# Patient Record
Sex: Male | Born: 1985 | Race: White | Hispanic: No | Marital: Single | Smoking: Current every day smoker
Health system: Southern US, Community
[De-identification: ages and names within clinical notes are randomized; demographics above are authoritative.]

## PROBLEM LIST (undated history)

## (undated) DIAGNOSIS — I10 Essential (primary) hypertension: Secondary | ICD-10-CM

## (undated) DIAGNOSIS — L732 Hidradenitis suppurativa: Secondary | ICD-10-CM

## (undated) DIAGNOSIS — F329 Major depressive disorder, single episode, unspecified: Secondary | ICD-10-CM

## (undated) DIAGNOSIS — E785 Hyperlipidemia, unspecified: Secondary | ICD-10-CM

## (undated) DIAGNOSIS — F419 Anxiety disorder, unspecified: Secondary | ICD-10-CM

## (undated) DIAGNOSIS — K219 Gastro-esophageal reflux disease without esophagitis: Secondary | ICD-10-CM

## (undated) DIAGNOSIS — F32A Depression, unspecified: Secondary | ICD-10-CM

## (undated) DIAGNOSIS — R7303 Prediabetes: Secondary | ICD-10-CM

## (undated) HISTORY — DX: Prediabetes: R73.03

## (undated) HISTORY — DX: Hyperlipidemia, unspecified: E78.5

## (undated) HISTORY — DX: Gastro-esophageal reflux disease without esophagitis: K21.9

## (undated) HISTORY — PX: OTHER SURGICAL HISTORY: SHX169

---

## 2011-11-12 ENCOUNTER — Emergency Department (INDEPENDENT_AMBULATORY_CARE_PROVIDER_SITE_OTHER)
Admission: EM | Admit: 2011-11-12 | Discharge: 2011-11-12 | Disposition: A | Discharge status: Home / Self Care | Payer: Self-pay | Source: Home / Self Care

## 2011-11-12 ENCOUNTER — Telehealth (HOSPITAL_COMMUNITY): Payer: Self-pay | Admitting: Physician Assistant

## 2011-11-12 DIAGNOSIS — R51 Headache: Secondary | ICD-10-CM

## 2011-11-12 DIAGNOSIS — F411 Generalized anxiety disorder: Secondary | ICD-10-CM

## 2011-11-12 DIAGNOSIS — F419 Anxiety disorder, unspecified: Secondary | ICD-10-CM

## 2011-11-12 HISTORY — DX: Anxiety disorder, unspecified: F41.9

## 2011-11-12 MED ORDER — ALPRAZOLAM 0.5 MG PO TABS: 0.5000 mg | ORAL_TABLET | Freq: Three times a day (TID) | ORAL | Status: AC | PRN

## 2011-11-12 MED ORDER — IBUPROFEN 800 MG PO TABS: 800.0000 mg | ORAL_TABLET | Freq: Three times a day (TID) | ORAL | Status: AC

## 2011-11-12 NOTE — ED Provider Notes (Signed)
Medical screening examination/treatment/procedure(s) were performed by non-physician practitioner and as supervising physician I was immediately available for consultation/collaboration.  Corrie Mckusick, MD 11/12/11 [phone]

## 2011-11-12 NOTE — ED Provider Notes (Signed)
History     CSN: [id number] Arrival date & time: 11/12/2011 12:09 PM   None     Chief Complaint  Patient presents with  . Anxiety    (Consider location/radiation/quality/duration/timing/severity/associated sxs/prior treatment) HPI Comments: Pt presents with c/o anxiety and headache. He states that he is under a tremendous amount of stress. 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 His grandmother has a lot of medical and behavioral problems. She is verbally abusive towards him. He does not sleep well due to the stress, but also is awakening during the night to care for her. He is considering the option of nursing home placement but would like to care for her in him home as long as possible and is working with her Dr regarding her anger outbursts.  He states he is "nervous all the time." One week ago began having heart pounding and HA which he describes as a pressure and throbbing of his entire head. No visual changes, N/V. Has had intermittent tingling in his face. Has not taken anything for his HA. His BP readings have been elevated during times of stress. Readings have been 158/109, P 127; 148/98, P 97; BP 156/101, P 104.  His mother gave him some of her Xanax for a couple of days and symptoms improved and BP returned to normal range. He denies SI or HI. He has a new pt appt with Dr Clarene Duke tomorrow and is requesting a rx of Xanax to get him through until tomorrow. He admits that he has had anxiety problems in the past and was also prescribed Xanax for short term use for a stressful situation then.   Patient is a 25 y.o. male presenting with anxiety. The history is provided by the patient (mother).  Anxiety This is a recurrent problem. The current episode started more than 1 week ago. The problem occurs constantly. The problem has not changed since onset.Associated symptoms include headaches.  Pertinent negatives include no chest pain, no abdominal pain and no shortness of breath. The symptoms are aggravated by stress. The symptoms are relieved by nothing. Treatments tried: mother's Xanax. The treatment provided significant relief.    History reviewed. No pertinent past medical history.  History reviewed. No pertinent past surgical history.  History reviewed. No pertinent family history.  History  Substance Use Topics  . Smoking status: Current Everyday Smoker  . Smokeless tobacco: Not on file  . Alcohol Use: No      Review of Systems  Constitutional: Negative for fever and chills.  HENT: Negative for ear pain, congestion, sore throat and neck pain.   Eyes: Negative for pain and visual disturbance.  Respiratory: Negative for cough and shortness of breath.   Cardiovascular: Positive for palpitations. Negative for chest pain.  Gastrointestinal: Negative for nausea, vomiting and abdominal pain.  Neurological: Positive for headaches. Negative for dizziness and light-headedness.  Psychiatric/Behavioral: Negative for suicidal ideas. The patient is nervous/anxious.     Allergies  Ceclor and Peanut-containing drug products  Home Medications   Current Outpatient Rx  Name Route Sig Dispense Refill  . ALPRAZOLAM 0.5 MG PO TABS Oral Take 1 tablet (0.5 mg total) by mouth 3 (three) times daily as needed for anxiety. 2 tablet 0  . IBUPROFEN 800 MG PO TABS Oral Take 1 tablet (800 mg total) by mouth 3 (three) times daily. 10 tablet 0  BP 140/80  Pulse 87  Temp(Src) 98.4 F (36.9 C) (Oral)  Resp 20  SpO2 97%  Physical Exam  Nursing note and vitals reviewed. Constitutional: He appears well-developed and well-nourished. No distress.  HENT:  Head: Normocephalic and atraumatic.  Right Ear: Tympanic membrane, external ear and ear canal normal.  Left Ear: Tympanic membrane, external ear and ear canal normal.  Nose: Nose normal.  Mouth/Throat: Uvula is midline, oropharynx  is clear and moist and mucous membranes are normal. No oropharyngeal exudate, posterior oropharyngeal edema or posterior oropharyngeal erythema.  Eyes: Conjunctivae, EOM and lids are normal. Pupils are equal, round, and reactive to light.  Fundoscopic exam:      The right eye shows no AV nicking, no hemorrhage and no papilledema.       The left eye shows no AV nicking, no hemorrhage and no papilledema.  Neck: Neck supple.  Cardiovascular: Normal rate, regular rhythm and normal heart sounds.   Pulmonary/Chest: Effort normal and breath sounds normal. No respiratory distress.  Lymphadenopathy:    He has no cervical adenopathy.  Neurological: He is alert.  Skin: Skin is warm and dry.  Psychiatric: His speech is normal and behavior is normal.       Pt became tearful and crying during visit while discussing his stress.    ED Course  Procedures (including critical care time)  Labs Reviewed - No data to display No results found.   1. Anxiety   2. Headache       MDM  Pt denies SI or HI. Has appt with PCP tomorrow for f/u.         Melody Comas, [place] 11/12/11 1421

## 2011-11-12 NOTE — ED Notes (Signed)
Pt stated he has been having a headache for at least a week now. Pt is orient x3 and  speaking full sentences.  Pt's BP 138/85, spo2 100%, and resp. 20.  Pt is stressed out from work and from taking care of grandmother, whom is diabetic.

## 2011-11-12 NOTE — ED Notes (Signed)
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

## 2011-11-24 NOTE — Telephone Encounter (Signed)
See prescription

## 2013-01-08 ENCOUNTER — Encounter (HOSPITAL_COMMUNITY): Payer: Self-pay | Admitting: Nurse Practitioner

## 2013-01-08 ENCOUNTER — Emergency Department (HOSPITAL_COMMUNITY): Payer: Self-pay

## 2013-01-08 ENCOUNTER — Emergency Department (HOSPITAL_COMMUNITY)
Admission: EM | Admit: 2013-01-08 | Discharge: 2013-01-08 | Disposition: A | Discharge status: Home / Self Care | Payer: Self-pay | Attending: Emergency Medicine | Admitting: Emergency Medicine

## 2013-01-08 DIAGNOSIS — R61 Generalized hyperhidrosis: Secondary | ICD-10-CM | POA: Insufficient documentation

## 2013-01-08 DIAGNOSIS — R002 Palpitations: Secondary | ICD-10-CM | POA: Insufficient documentation

## 2013-01-08 DIAGNOSIS — Z79899 Other long term (current) drug therapy: Secondary | ICD-10-CM | POA: Insufficient documentation

## 2013-01-08 DIAGNOSIS — Z87448 Personal history of other diseases of urinary system: Secondary | ICD-10-CM | POA: Insufficient documentation

## 2013-01-08 DIAGNOSIS — F1210 Cannabis abuse, uncomplicated: Secondary | ICD-10-CM | POA: Insufficient documentation

## 2013-01-08 DIAGNOSIS — F17200 Nicotine dependence, unspecified, uncomplicated: Secondary | ICD-10-CM | POA: Insufficient documentation

## 2013-01-08 HISTORY — DX: Hidradenitis suppurativa: L73.2

## 2013-01-08 LAB — BASIC METABOLIC PANEL
CO2: 22 mEq/L (ref 19–32)
Chloride: 83 mEq/L — ABNORMAL LOW (ref 96–112)
Chloride: 102 mEq/L (ref 96–112)
GFR calc Af Amer: >90 mL/min (ref >90)
GFR calc non Af Amer: >90 mL/min (ref >90)
Potassium: 3.5 mEq/L (ref 3.5–5.1)
Potassium: 3.4 mEq/L — ABNORMAL LOW (ref 3.5–5.1)
Sodium: 162 mEq/L (ref 135–145)

## 2013-01-08 LAB — POCT I-STAT, CHEM 8
HCT: 47 % (ref 39.0–52.0)
Hemoglobin: 16 g/dL (ref 13.0–17.0)
Potassium: 3.2 mEq/L — ABNORMAL LOW (ref 3.5–5.1)
Sodium: 141 mEq/L (ref 135–145)

## 2013-01-08 LAB — D-DIMER, QUANTITATIVE: D-Dimer, Quant: <0.27 ug/mL-FEU (ref 0.00–0.48)

## 2013-01-08 LAB — CBC
MCV: 83.8 fL (ref 78.0–100.0)
Platelets: 172 10*3/uL (ref 150–400)
RBC: 5.68 MIL/uL (ref 4.22–5.81)
WBC: 8.8 10*3/uL (ref 4.0–10.5)

## 2013-01-08 LAB — POCT I-STAT TROPONIN I: Troponin i, poc: 0 ng/mL (ref 0.00–0.08)

## 2013-01-08 MED ORDER — LORAZEPAM 2 MG/ML IJ SOLN
1.0000 mg | Freq: Once | INTRAMUSCULAR | Status: AC
  Administered 2013-01-08: 1 mg via INTRAVENOUS
  Filled 2013-01-08: qty 1

## 2013-01-08 MED ORDER — LORAZEPAM 1 MG PO TABS: 1.0000 mg | ORAL_TABLET | Freq: Three times a day (TID) | ORAL | Status: DC | PRN

## 2013-01-08 MED ORDER — DEXTROSE 5 % IV SOLN
Freq: Once | INTRAVENOUS | Status: AC
  Administered 2013-01-08: 13:00:00 via INTRAVENOUS

## 2013-01-08 MED ORDER — SODIUM CHLORIDE 0.9 % IV BOLUS (SEPSIS): 1000.0000 mL | Freq: Once | INTRAVENOUS | Status: DC

## 2013-01-08 MED ORDER — SODIUM CHLORIDE 0.9 % IV SOLN
Freq: Once | INTRAVENOUS | Status: DC
  Administered 2013-01-08: 12:00:00 via INTRAVENOUS

## 2013-01-08 NOTE — ED Notes (Signed)
Patient transported from X-ray 

## 2013-01-08 NOTE — ED Notes (Signed)
Patient transported to X-ray 

## 2013-01-08 NOTE — ED Notes (Signed)
Family at bedside. 

## 2013-01-08 NOTE — ED Provider Notes (Signed)
History     CSN: [id number]  Arrival date & time 01/08/13  1106   First MD Initiated Contact with Patient 01/08/13 1150      Chief Complaint  Patient presents with  . Panic Attack    (Consider location/radiation/quality/duration/timing/severity/associated sxs/prior treatment) HPI  the patient presents with multiple complaints.  He states that over the past day he has had increasingly significant anxiety, palpitations, generalized sense of discomfort.  This has not improved with clonazepam or Xanax.  There is no associated focal pain, but the patient states that he generally uncomfortable. The patient has had similar prior events over the past months, without clear etiology.  He takes approximately 3 Xanax tablets daily. He is generally well beyond a diagnosis of anxiety, hidradenitis suprativa.  He has a notable family history of several family members with bicuspid aortic valve. The patient smokes cigarettes, marijuana. The patient also drinks alcohol. He states that his symptoms have not improved today has been abusing marijuana in addition to the Xanax and clonazepam.  Past Medical History  Diagnosis Date  . Anxiety   . Hidradenitis suppurativa   . Anxiety     No past surgical history on file.  No family history on file.  History  Substance Use Topics  . Smoking status: Current Every Day Smoker  . Smokeless tobacco: Not on file  . Alcohol Use: Yes      Review of Systems  Constitutional:       Per HPI, otherwise negative  HENT:       Per HPI, otherwise negative  Eyes: Negative.   Respiratory:       Per HPI, otherwise negative  Cardiovascular:       Per HPI, otherwise negative  Gastrointestinal: Negative for vomiting.  Genitourinary: Negative.   Musculoskeletal:       Per HPI, otherwise negative  Skin: Negative.   Neurological: Negative for syncope.    Allergies  Ceclor and Peanut-containing drug products  Home Medications  No current outpatient  prescriptions on file.  BP 139/95  Pulse 112  Temp 98.1 F (36.7 C) (Oral)  Resp 18  SpO2 99%  Physical Exam  Nursing note and vitals reviewed. Constitutional: He is oriented to person, place, and time. He appears well-developed. No distress.  HENT:  Head: Normocephalic and atraumatic.  Eyes: Conjunctivae normal and EOM are normal.  Cardiovascular: Regular rhythm.  Tachycardia present.   Pulmonary/Chest: Effort normal. No stridor. No respiratory distress.  Abdominal: He exhibits no distension.  Musculoskeletal: He exhibits no edema.  Neurological: He is alert and oriented to person, place, and time.  Skin: Skin is warm. He is diaphoretic.  Psychiatric: His mood appears anxious.    ED Course  Procedures (including critical care time)  Labs Reviewed  CBC - Abnormal; Notable for the following:    Hemoglobin 17.6 (*)     MCHC 37.0 (*)     All other components within normal limits  BASIC METABOLIC PANEL - Abnormal; Notable for the following:    Sodium 162 (*)     Chloride 83 (*)     Glucose, Bld 115 (*)     All other components within normal limits  POCT I-STAT TROPONIN I  D-DIMER, QUANTITATIVE   No results found.   No diagnosis found.  Cardiac 121 sinus tach abnormal Pulse ox 99% room air normal  Update: The patient's initial labs demonstrate a sodium of 162, with low calcium, low chloride.  Resuscitation with D5 initiated, confirmatory labs sent.  1:30 PM New labs suggest Na value was lab error  Patient feeling better.  4:16 PM The patient is substantially more calm appearing.  He states that he feels substantially better.  HR - regular - 85 sr, normal   Date: Repeat lab confirms that the study was not significantly elevated  MDM  This young generally well-appearing male presents with ongoing anxiety, pain, palpitations.  Notably, the initial laboratory evaluation for the patient demonstrated significant hypernatremia, though this seems to be lab error.   The patient improved most substantially following provision of Ativan, which is concurrent with a likely diagnosis of anxiety, though with his palpitations, the chronicity of this issue, he requires further evaluation, beyond that available here.  We discussed the possible etiologies of his anxiety, pain, palpitations, including, but not limited to metabolic, endocrinologic, toxicologic causes.  The patient was discharged in stable condition, though with outpatient followup necessary.  Gerhard Munch, MD 01/08/13 2014

## 2013-01-08 NOTE — ED Notes (Signed)
Pt reports taking up to 3mg  xanax for months and has now run out.  Pt also reports feeling anxious for few days.

## 2013-01-08 NOTE — ED Notes (Signed)
Pt reports history of panic attacks, reports since last night he has been having a bad panic attack. Reports he feels his heart is racing, his bp was elevated at home and feels like R arm is numb intermittently also. Pt did have a prescription for xanax that helped but he no longer sees that provider. A&Ox4, breathing easily.

## 2013-01-08 NOTE — ED Notes (Signed)
Lab at bedside

## 2013-01-08 NOTE — ED Notes (Signed)
MD at bedside. 

## 2013-06-07 ENCOUNTER — Emergency Department (HOSPITAL_BASED_OUTPATIENT_CLINIC_OR_DEPARTMENT_OTHER)
Admission: EM | Admit: 2013-06-07 | Discharge: 2013-06-07 | Disposition: A | Discharge status: Home / Self Care | Payer: Self-pay | Attending: Emergency Medicine | Admitting: Emergency Medicine

## 2013-06-07 ENCOUNTER — Encounter (HOSPITAL_BASED_OUTPATIENT_CLINIC_OR_DEPARTMENT_OTHER): Payer: Self-pay | Admitting: Student

## 2013-06-07 DIAGNOSIS — E119 Type 2 diabetes mellitus without complications: Secondary | ICD-10-CM | POA: Insufficient documentation

## 2013-06-07 DIAGNOSIS — F329 Major depressive disorder, single episode, unspecified: Secondary | ICD-10-CM | POA: Insufficient documentation

## 2013-06-07 DIAGNOSIS — F411 Generalized anxiety disorder: Secondary | ICD-10-CM | POA: Insufficient documentation

## 2013-06-07 DIAGNOSIS — F17200 Nicotine dependence, unspecified, uncomplicated: Secondary | ICD-10-CM | POA: Insufficient documentation

## 2013-06-07 DIAGNOSIS — Z79899 Other long term (current) drug therapy: Secondary | ICD-10-CM | POA: Insufficient documentation

## 2013-06-07 DIAGNOSIS — F3289 Other specified depressive episodes: Secondary | ICD-10-CM | POA: Insufficient documentation

## 2013-06-07 DIAGNOSIS — I10 Essential (primary) hypertension: Secondary | ICD-10-CM | POA: Insufficient documentation

## 2013-06-07 DIAGNOSIS — Z872 Personal history of diseases of the skin and subcutaneous tissue: Secondary | ICD-10-CM | POA: Insufficient documentation

## 2013-06-07 DIAGNOSIS — J029 Acute pharyngitis, unspecified: Secondary | ICD-10-CM | POA: Insufficient documentation

## 2013-06-07 HISTORY — DX: Depression, unspecified: F32.A

## 2013-06-07 HISTORY — DX: Essential (primary) hypertension: I10

## 2013-06-07 HISTORY — DX: Major depressive disorder, single episode, unspecified: F32.9

## 2013-06-07 LAB — RAPID STREP SCREEN (MED CTR MEBANE ONLY): Streptococcus, Group A Screen (Direct): NEGATIVE

## 2013-06-07 MED ORDER — AZITHROMYCIN 250 MG PO TABS: 250.0000 mg | ORAL_TABLET | Freq: Every day | ORAL | Status: DC

## 2013-06-07 MED ORDER — AZITHROMYCIN 250 MG PO TABS
500.0000 mg | ORAL_TABLET | Freq: Once | ORAL | Status: AC
  Administered 2013-06-07: 500 mg via ORAL
  Filled 2013-06-07: qty 2

## 2013-06-07 MED ORDER — HYDROCODONE-ACETAMINOPHEN 5-325 MG PO TABS: 1.0000 | ORAL_TABLET | ORAL | Status: DC | PRN

## 2013-06-07 MED ORDER — IBUPROFEN 800 MG PO TABS
800.0000 mg | ORAL_TABLET | Freq: Once | ORAL | Status: AC
  Administered 2013-06-07: 800 mg via ORAL
  Filled 2013-06-07: qty 1

## 2013-06-07 NOTE — ED Notes (Signed)
EDP at bedside  

## 2013-06-07 NOTE — ED Notes (Signed)
Pt in from [facility] with c/o fever of 102.5, generalized aches and chills with associated sore throat.

## 2013-06-07 NOTE — ED Provider Notes (Signed)
History     CSN: [id number]  Arrival date & time 06/07/13  1624   First MD Initiated Contact with Patient 06/07/13 1630      Chief Complaint  Patient presents with  . Fever    (Consider location/radiation/quality/duration/timing/severity/associated sxs/prior treatment) Patient is a 27 y.o. male presenting with fever. The history is provided by the patient. No language interpreter was used.  Fever Max temp prior to arrival:  102.5 Temp source:  Oral Severity:  Moderate Onset quality:  Sudden Duration:  2 days Timing:  Constant Progression:  Worsening Chronicity:  New Relieved by:  Nothing Associated symptoms: sore throat   Associated symptoms: no nausea and no rash     Past Medical History  Diagnosis Date  . Anxiety   . Hidradenitis suppurativa   . Anxiety   . SVT (supraventricular tachycardia)   . Diabetes mellitus without complication   . Depression   . Hypertension     History reviewed. No pertinent past surgical history.  History reviewed. No pertinent family history.  History  Substance Use Topics  . Smoking status: Current Every Day Smoker  . Smokeless tobacco: Not on file  . Alcohol Use: Yes      Review of Systems  Constitutional: Positive for fever.  HENT: Positive for sore throat.   Respiratory: Negative.   Cardiovascular: Negative.   Gastrointestinal: Negative for nausea.  Skin: Negative for rash.    Allergies  Peanut-containing drug products; Ceclor; and Sulfa antibiotics  Home Medications   Current Outpatient Rx  Name  Route  Sig  Dispense  Refill  . buPROPion (WELLBUTRIN SR) 100 MG 12 hr tablet   Oral   Take 100 mg by mouth 2 (two) times daily.         . citalopram (CELEXA) 20 MG tablet   Oral   Take 20 mg by mouth daily.         Marland Kitchen lisinopril (PRINIVIL,ZESTRIL) 20 MG tablet   Oral   Take 20 mg by mouth daily.         . metFORMIN (GLUCOPHAGE) 500 MG tablet   Oral   Take 500 mg by mouth 2 (two) times daily with a  meal.         . propranolol (INDERAL) 80 MG tablet   Oral   Take 80 mg by mouth 2 (two) times daily.         Marland Kitchen LORazepam (ATIVAN) 1 MG tablet   Oral   Take 1 tablet (1 mg total) by mouth 3 (three) times daily as needed for anxiety.   15 tablet   0     BP 155/73  Pulse 126  Temp(Src) 102.5 F (39.2 C) (Oral)  Resp 20  Wt 283 lb (128.368 kg)  SpO2 99%  Physical Exam  Nursing note and vitals reviewed. Constitutional: He is oriented to person, place, and time. He appears well-developed and well-nourished.  HENT:  Right Ear: External ear normal.  Left Ear: External ear normal.  Mouth/Throat: Oropharyngeal exudate and posterior oropharyngeal erythema present.  Cardiovascular: Normal rate and regular rhythm.   Pulmonary/Chest: Effort normal and breath sounds normal.  Musculoskeletal: Normal range of motion.  Neurological: He is alert and oriented to person, place, and time.  Skin: Skin is warm and dry.  Psychiatric: He has a normal mood and affect.    ED Course  Procedures (including critical care time)  Labs Reviewed  RAPID STREP SCREEN  CULTURE, GROUP A STREP  MONONUCLEOSIS SCREEN  No results found.   1. Pharyngitis       MDM  Will treat for strep based on symptoms:pt agree with treatment and pt told to return for worsening symptoms:no sign of peritonsillar abscess at this time       Teressa Lower, NP 06/07/13 1801

## 2013-06-07 NOTE — ED Notes (Signed)
Fluid challenge successful. No difficulty swallowing or maintaining secretions noted.

## 2013-06-07 NOTE — ED Notes (Signed)
Pt OOB to restroom. Ambulates well, gait steady.

## 2013-06-08 NOTE — ED Provider Notes (Signed)
Medical screening examination/treatment/procedure(s) were performed by non-physician practitioner and as supervising physician I was immediately available for consultation/collaboration.   Charles B. Sheldon, MD 06/08/13 0916 

## 2013-06-09 ENCOUNTER — Ambulatory Visit (INDEPENDENT_AMBULATORY_CARE_PROVIDER_SITE_OTHER): Payer: Self-pay | Admitting: Emergency Medicine

## 2013-06-09 ENCOUNTER — Encounter (HOSPITAL_COMMUNITY): Payer: Self-pay | Admitting: Emergency Medicine

## 2013-06-09 ENCOUNTER — Emergency Department (HOSPITAL_COMMUNITY)
Admission: EM | Admit: 2013-06-09 | Discharge: 2013-06-09 | Disposition: A | Discharge status: Home / Self Care | Payer: Self-pay | Attending: Emergency Medicine | Admitting: Emergency Medicine

## 2013-06-09 VITALS — BP 128/83 | HR 87 | Temp 98.3°F | Resp 18 | Ht 76.0 in | Wt 272.8 lb

## 2013-06-09 DIAGNOSIS — R5383 Other fatigue: Secondary | ICD-10-CM | POA: Insufficient documentation

## 2013-06-09 DIAGNOSIS — E860 Dehydration: Secondary | ICD-10-CM

## 2013-06-09 DIAGNOSIS — F411 Generalized anxiety disorder: Secondary | ICD-10-CM | POA: Insufficient documentation

## 2013-06-09 DIAGNOSIS — M2550 Pain in unspecified joint: Secondary | ICD-10-CM | POA: Insufficient documentation

## 2013-06-09 DIAGNOSIS — J039 Acute tonsillitis, unspecified: Secondary | ICD-10-CM

## 2013-06-09 DIAGNOSIS — E119 Type 2 diabetes mellitus without complications: Secondary | ICD-10-CM

## 2013-06-09 DIAGNOSIS — Z792 Long term (current) use of antibiotics: Secondary | ICD-10-CM | POA: Insufficient documentation

## 2013-06-09 DIAGNOSIS — R509 Fever, unspecified: Secondary | ICD-10-CM

## 2013-06-09 DIAGNOSIS — I10 Essential (primary) hypertension: Secondary | ICD-10-CM | POA: Insufficient documentation

## 2013-06-09 DIAGNOSIS — IMO0001 Reserved for inherently not codable concepts without codable children: Secondary | ICD-10-CM | POA: Insufficient documentation

## 2013-06-09 DIAGNOSIS — R5381 Other malaise: Secondary | ICD-10-CM | POA: Insufficient documentation

## 2013-06-09 DIAGNOSIS — R599 Enlarged lymph nodes, unspecified: Secondary | ICD-10-CM | POA: Insufficient documentation

## 2013-06-09 DIAGNOSIS — F17200 Nicotine dependence, unspecified, uncomplicated: Secondary | ICD-10-CM | POA: Insufficient documentation

## 2013-06-09 DIAGNOSIS — I498 Other specified cardiac arrhythmias: Secondary | ICD-10-CM | POA: Insufficient documentation

## 2013-06-09 DIAGNOSIS — R1310 Dysphagia, unspecified: Secondary | ICD-10-CM | POA: Insufficient documentation

## 2013-06-09 DIAGNOSIS — Z79899 Other long term (current) drug therapy: Secondary | ICD-10-CM | POA: Insufficient documentation

## 2013-06-09 DIAGNOSIS — J029 Acute pharyngitis, unspecified: Secondary | ICD-10-CM | POA: Insufficient documentation

## 2013-06-09 DIAGNOSIS — Z872 Personal history of diseases of the skin and subcutaneous tissue: Secondary | ICD-10-CM | POA: Insufficient documentation

## 2013-06-09 DIAGNOSIS — F329 Major depressive disorder, single episode, unspecified: Secondary | ICD-10-CM | POA: Insufficient documentation

## 2013-06-09 DIAGNOSIS — F3289 Other specified depressive episodes: Secondary | ICD-10-CM | POA: Insufficient documentation

## 2013-06-09 LAB — POCT CBC
HCT, POC: 45.3 % (ref 43.5–53.7)
Hemoglobin: 14.7 g/dL (ref 14.1–18.1)
Lymph, poc: 1 (ref 0.6–3.4)
MCH, POC: 31.1 pg (ref 27–31.2)
MCHC: 32.5 g/dL (ref 31.8–35.4)
WBC: 16 10*3/uL — AB (ref 4.6–10.2)

## 2013-06-09 LAB — COMPREHENSIVE METABOLIC PANEL
ALT: 26 U/L (ref 0–53)
AST: 18 U/L (ref 0–37)
Albumin: 4.8 g/dL (ref 3.5–5.2)
Calcium: 9.1 mg/dL (ref 8.4–10.5)
Chloride: 103 mEq/L (ref 96–112)
Creat: 0.71 mg/dL (ref 0.50–1.35)
Potassium: 4.1 mEq/L (ref 3.5–5.3)

## 2013-06-09 LAB — CULTURE, GROUP A STREP

## 2013-06-09 LAB — GLUCOSE, POCT (MANUAL RESULT ENTRY): POC Glucose: 111 mg/dl — AB (ref 70–99)

## 2013-06-09 MED ORDER — AMOXICILLIN-POT CLAVULANATE 875-125 MG PO TABS: 1.0000 | ORAL_TABLET | Freq: Two times a day (BID) | ORAL | Status: DC

## 2013-06-09 MED ORDER — HYDROCODONE-ACETAMINOPHEN 7.5-325 MG/15ML PO SOLN: 15.0000 mL | Freq: Four times a day (QID) | ORAL | Status: DC | PRN

## 2013-06-09 MED ORDER — HYDROCODONE-ACETAMINOPHEN 7.5-325 MG/15ML PO SOLN
10.0000 mL | Freq: Once | ORAL | Status: AC
  Administered 2013-06-09: 10 mL via ORAL
  Filled 2013-06-09: qty 30

## 2013-06-09 MED ORDER — SODIUM CHLORIDE 0.9 % IV SOLN
1.5000 g | Freq: Once | INTRAVENOUS | Status: AC
  Administered 2013-06-09: 1.5 g via INTRAVENOUS
  Filled 2013-06-09 (×2): qty 1.5

## 2013-06-09 NOTE — ED Provider Notes (Signed)
Medical screening examination/treatment/procedure(s) were performed by non-physician practitioner and as supervising physician I was immediately available for consultation/collaboration.   Richardean Canal, MD 06/09/13 [phone]

## 2013-06-09 NOTE — Progress Notes (Signed)
  Subjective:    Patient ID: Spencer Love, male    DOB: Jun 28, 1986, 27 y.o.   MRN: [id number]  HPI 27 year old male presents with sore throat, fever, dizziness x 1 week  104.3 fever yesterday Treated for strep throat x 6/10 with zithromax when went to [facility]  110/64 bp at 2:21pm Right ear pain  History of diabetes  Review of Systems     Objective:   Physical Exam TMs are clear. Nose is normal. Tonsils are 4+ with exudate over both. Is tender right anterior cervical node neck is supple chest is clear abdomen is soft I did not feel a spleen. Results for orders placed in visit on 06/09/13  POCT CBC      Result Value Range   WBC 16.0 (*) 4.6 - 10.2 K/uL   Lymph, poc 1.0  0.6 - 3.4   POC LYMPH PERCENT 6.1 (*) 10 - 50 %L   MID (cbc) 0.6  0 - 0.9   POC MID % 3.9  0 - 12 %M   POC Granulocyte 14.4 (*) 2 - 6.9   Granulocyte percent 90.0 (*) 37 - 80 %G   RBC 4.72  4.69 - 6.13 M/uL   Hemoglobin 14.7  14.1 - 18.1 g/dL   HCT, POC 40.9  81.1 - 53.7 %   MCV 95.9  80 - 97 fL   MCH, POC 31.1  27 - 31.2 pg   MCHC 32.5  31.8 - 35.4 g/dL   RDW, POC 91.4     Platelet Count, POC 156  142 - 424 K/uL   MPV 9.7  0 - 99.8 fL  GLUCOSE, POCT (MANUAL RESULT ENTRY)      Result Value Range   POC Glucose 111 (*) 70 - 99 mg/dl        Assessment & Plan:   patient has been on Zithromax has a white count today of 16,000 with a left shift. He appears dehydrated since he has been able to take in fluids due to severe sore throat. Routine culture was done of the throat for all organisms. His white count is difficult to interpret because he has been on steroids.

## 2013-06-09 NOTE — ED Notes (Signed)
From urgent care states that he was sent due to fever and dehydration. IV initiated at urgent care. 135/80 74 12 97% RA WBC 16. States that he was on a Zpack for tonsilitis and has not been able to eat/drink due to sore throat.

## 2013-06-09 NOTE — ED Notes (Signed)
Bed:WHALA<BR> Expected date:<BR> Expected time:<BR> Means of arrival:<BR> Comments:<BR> ems- from urgent care, 27 yo. Sore throat, diabetic, dehydration

## 2013-06-09 NOTE — ED Notes (Signed)
Pt request food to eat,  He was given a [place] sandwich and he refused it.  Pt had family/friend at bedside to take iv medication down

## 2013-06-09 NOTE — ED Provider Notes (Signed)
History     CSN: [id number]  Arrival date & time 06/09/13  1539   First MD Initiated Contact with Patient 06/09/13 1613      Chief Complaint  Patient presents with  . Fever  . outpatient treatment failure     (Consider location/radiation/quality/duration/timing/severity/associated sxs/prior treatment) HPI Comments: 27 year old male presents to the emergency department from urgent care complaining of fever and dehydration. Patient was seen at [facility] 2 days back and was placed on a z-pack for pharyngitis. States the antibiotic is not working, went to [facility] urgent care today and was told he was dehydrated. MAXIMUM TEMPERATURE was 105 2 days ago, 104 yesterday and 102.5 earlier today. He has been taking Tylenol and Motrin for the fever. On arrival to the emergency department his temperature was 98.3. IV fluids were started at urgent care and he was sent to the emergency department. Had an apparent leukocytosis of 16. Currently patient is complaining of not improved sore throat, body aches and pains.  Patient is a 27 y.o. male presenting with fever. The history is provided by the patient.  Fever Associated symptoms: chills, headaches, myalgias and sore throat   Associated symptoms: no chest pain     Past Medical History  Diagnosis Date  . Anxiety   . Hidradenitis suppurativa   . Anxiety   . SVT (supraventricular tachycardia)   . Diabetes mellitus without complication   . Depression   . Hypertension     History reviewed. No pertinent past surgical history.  No family history on file.  History  Substance Use Topics  . Smoking status: Current Every Day Smoker  . Smokeless tobacco: Not on file  . Alcohol Use: Yes      Review of Systems  Constitutional: Positive for fever, chills and fatigue.  HENT: Positive for sore throat, trouble swallowing and voice change. Negative for drooling.   Cardiovascular: Negative for chest pain.  Genitourinary: Negative for  hematuria and difficulty urinating.  Musculoskeletal: Positive for myalgias and arthralgias.  Neurological: Positive for headaches.  Hematological: Positive for adenopathy.  All other systems reviewed and are negative.    Allergies  Peanut-containing drug products; Ceclor; and Sulfa antibiotics  Home Medications   Current Outpatient Rx  Name  Route  Sig  Dispense  Refill  . azithromycin (ZITHROMAX) 250 MG tablet   Oral   Take 250-500 mg by mouth daily. 5 day course of therapy started 06/07/13.  Take 2 tabs on Day 1, then 1 tab daily until finished.         Marland Kitchen buPROPion (WELLBUTRIN XL) 150 MG 24 hr tablet   Oral   Take 150 mg by mouth daily.         . citalopram (CELEXA) 40 MG tablet   Oral   Take 20 mg by mouth daily.         Marland Kitchen HYDROcodone-acetaminophen (NORCO/VICODIN) 5-325 MG per tablet   Oral   Take 1 tablet by mouth every 4 (four) hours as needed for pain.   10 tablet   0   . lisinopril (PRINIVIL,ZESTRIL) 20 MG tablet   Oral   Take 20 mg by mouth daily.         Marland Kitchen LORazepam (ATIVAN) 1 MG tablet   Oral   Take 1 tablet (1 mg total) by mouth 3 (three) times daily as needed for anxiety.   15 tablet   0   . metFORMIN (GLUCOPHAGE) 500 MG tablet   Oral   Take  500 mg by mouth 2 (two) times daily with a meal.         . OVER THE COUNTER MEDICATION   Oral   Take 1 tablet by mouth at bedtime. OTC Sleep Aid from [place].         . predniSONE (DELTASONE) 10 MG tablet   Oral   Take 10-30 mg by mouth daily. 11 day taper started 06/07/13; 3 tabs daily for 3 days, then 2 tabs daily for 3 days, then 1 tab daily for 5 days.         . propranolol (INDERAL) 80 MG tablet   Oral   Take 80 mg by mouth 2 (two) times daily.           BP 135/70  Pulse 83  Temp(Src) 98 F (36.7 C) (Oral)  Resp 18  SpO2 98%  Physical Exam  Nursing note and vitals reviewed. Constitutional: He is oriented to person, place, and time. He appears well-developed and well-nourished.  No distress.  HENT:  Head: Normocephalic and atraumatic.  Nose: Nose normal.  Mouth/Throat: Uvula is midline and mucous membranes are normal.  Tonsils enlarged and inflamed bilateral +4 with exudate. No tonsillar abscess.  Eyes: Conjunctivae and EOM are normal. Pupils are equal, round, and reactive to light.  Neck: Normal range of motion. Neck supple.  Cardiovascular: Normal rate, regular rhythm, normal heart sounds and intact distal pulses.   Pulmonary/Chest: Effort normal and breath sounds normal. No respiratory distress. He has no wheezes. He has no rales.  Musculoskeletal: Normal range of motion. He exhibits no edema.  Lymphadenopathy:       Head (right side): Tonsillar adenopathy present.       Head (left side): Tonsillar adenopathy present.  Neurological: He is alert and oriented to person, place, and time.  Skin: Skin is warm and dry. He is not diaphoretic.  Psychiatric: He has a normal mood and affect. His behavior is normal.    ED Course  Procedures (including critical care time)  Labs Reviewed - No data to display No results found.   1. Tonsillitis       MDM  27 y/o male with tonsillitis. No evidence of tonsillar abscess. Afebrile in the ED, last dose of motrin was about 4 hours PTA. Normal vital signs. Swallowing secretions well. Lortab solution given with some relief along with fluid bolus. Dr. Silverio Lay also evaluated patient and does not feel tonsillar abscess is of concern. Will give a dose of IV unasyn and discharge with augmentin due to outpatient failure of azithromycin. Dr. Silverio Lay agreeable. 8:01 PM Unasyn given. Fluids complete. Rx augmentin. Vitals stable. Stable for discharge.  Trevor Mace, PA-C 06/09/13 2001

## 2013-06-10 LAB — EPSTEIN-BARR VIRUS VCA ANTIBODY PANEL: EBV NA IgG: 358 U/mL — ABNORMAL HIGH (ref <18.0)

## 2013-06-14 LAB — WOUND CULTURE

## 2013-10-23 ENCOUNTER — Emergency Department (HOSPITAL_COMMUNITY)
Admission: EM | Admit: 2013-10-23 | Discharge: 2013-10-23 | Disposition: A | Discharge status: Home / Self Care | Payer: Self-pay | Attending: Emergency Medicine | Admitting: Emergency Medicine

## 2013-10-23 ENCOUNTER — Encounter (HOSPITAL_COMMUNITY): Payer: Self-pay | Admitting: Emergency Medicine

## 2013-10-23 DIAGNOSIS — I10 Essential (primary) hypertension: Secondary | ICD-10-CM | POA: Insufficient documentation

## 2013-10-23 DIAGNOSIS — E119 Type 2 diabetes mellitus without complications: Secondary | ICD-10-CM | POA: Insufficient documentation

## 2013-10-23 DIAGNOSIS — IMO0002 Reserved for concepts with insufficient information to code with codable children: Secondary | ICD-10-CM | POA: Insufficient documentation

## 2013-10-23 DIAGNOSIS — F17200 Nicotine dependence, unspecified, uncomplicated: Secondary | ICD-10-CM | POA: Insufficient documentation

## 2013-10-23 DIAGNOSIS — F329 Major depressive disorder, single episode, unspecified: Secondary | ICD-10-CM | POA: Insufficient documentation

## 2013-10-23 DIAGNOSIS — L0291 Cutaneous abscess, unspecified: Secondary | ICD-10-CM

## 2013-10-23 DIAGNOSIS — Z79899 Other long term (current) drug therapy: Secondary | ICD-10-CM | POA: Insufficient documentation

## 2013-10-23 DIAGNOSIS — F411 Generalized anxiety disorder: Secondary | ICD-10-CM | POA: Insufficient documentation

## 2013-10-23 DIAGNOSIS — F3289 Other specified depressive episodes: Secondary | ICD-10-CM | POA: Insufficient documentation

## 2013-10-23 MED ORDER — OXYCODONE-ACETAMINOPHEN 5-325 MG PO TABS: 2.0000 | ORAL_TABLET | Freq: Four times a day (QID) | ORAL | Status: DC | PRN

## 2013-10-23 MED ORDER — DOXYCYCLINE HYCLATE 100 MG PO CAPS: 100.0000 mg | ORAL_CAPSULE | Freq: Two times a day (BID) | ORAL | Status: DC

## 2013-10-23 MED ORDER — LORAZEPAM 1 MG PO TABS
1.0000 mg | ORAL_TABLET | Freq: Once | ORAL | Status: AC
  Administered 2013-10-23: 1 mg via ORAL
  Filled 2013-10-23: qty 2

## 2013-10-23 NOTE — ED Notes (Signed)
Pt presents to department for evaluation of possible abscess to R axilla. Ongoing for several days. 10/10 pain at the time. Area under arm noted to red, warm and tender to touch.

## 2013-10-23 NOTE — ED Provider Notes (Signed)
Medical screening examination/treatment/procedure(s) were performed by non-physician practitioner and as supervising physician I was immediately available for consultation/collaboration.  EKG Interpretation   None         Salsabeel Gorelick, MD 10/23/13 2355 

## 2013-10-23 NOTE — ED Provider Notes (Signed)
This chart was scribed for Ivar Drape PA-C, a non-physician practitioner working with Gwyneth Sprout, MD by Lewanda Rife, ED Scribe. This patient was seen in room TR10C/TR10C and the patient's care was started at 4:51 PM     CSN: [id number]     Arrival date & time 10/23/13  1601 History   First MD Initiated Contact with Patient 10/23/13 1614     Chief Complaint  Patient presents with  . Abscess   (Consider location/radiation/quality/duration/timing/severity/associated sxs/prior Treatment) The history is provided by the patient. No language interpreter was used.   HPI Comments: Spencer Love is a 27 y.o. male who presents to the Emergency Department with PMHx of Hidradenitis suppurativa complaining of worsening abscess on right axilla onset 4 days. Describes abscess as warm to the touch, red, and severely painful. Reports associated nausea. Reports pain is exacerbated by touch and alleviated by nothing. Reports using a warm compress with no relief of symptoms. Denies associated fever, and chills.    Past Medical History  Diagnosis Date  . Anxiety   . Hidradenitis suppurativa   . Anxiety   . SVT (supraventricular tachycardia)   . Diabetes mellitus without complication   . Depression   . Hypertension    History reviewed. No pertinent past surgical history. History reviewed. No pertinent family history. History  Substance Use Topics  . Smoking status: Current Every Day Smoker    Types: Cigarettes  . Smokeless tobacco: Not on file  . Alcohol Use: Yes     Comment: social    Review of Systems  Constitutional: Negative for fever.  Skin:       Abscess in right axilla   All other systems reviewed and are negative.   A complete 10 system review of systems was obtained and all systems are negative except as noted in the HPI and PMHx.    Allergies  Peanut-containing drug products; Ceclor; and Sulfa antibiotics  Home Medications   Current Outpatient Rx  Name   Route  Sig  Dispense  Refill  . amoxicillin-clavulanate (AUGMENTIN) 875-125 MG per tablet   Oral   Take 1 tablet by mouth 2 (two) times daily. One po bid x 7 days   14 tablet   0   . azithromycin (ZITHROMAX) 250 MG tablet   Oral   Take 250-500 mg by mouth daily. 5 day course of therapy started 06/07/13.  Take 2 tabs on Day 1, then 1 tab daily until finished.         Marland Kitchen buPROPion (WELLBUTRIN XL) 150 MG 24 hr tablet   Oral   Take 150 mg by mouth daily.         . citalopram (CELEXA) 40 MG tablet   Oral   Take 20 mg by mouth daily.         Marland Kitchen HYDROcodone-acetaminophen (HYCET) 7.5-325 mg/15 ml solution   Oral   Take 15 mLs by mouth 4 (four) times daily as needed for pain.   120 mL   0   . HYDROcodone-acetaminophen (NORCO/VICODIN) 5-325 MG per tablet   Oral   Take 1 tablet by mouth every 4 (four) hours as needed for pain.   10 tablet   0   . lisinopril (PRINIVIL,ZESTRIL) 20 MG tablet   Oral   Take 20 mg by mouth daily.         Marland Kitchen LORazepam (ATIVAN) 1 MG tablet   Oral   Take 1 tablet (1 mg total) by mouth 3 (three) times daily  as needed for anxiety.   15 tablet   0   . metFORMIN (GLUCOPHAGE) 500 MG tablet   Oral   Take 500 mg by mouth 2 (two) times daily with a meal.         . OVER THE COUNTER MEDICATION   Oral   Take 1 tablet by mouth at bedtime. OTC Sleep Aid from [place].         . predniSONE (DELTASONE) 10 MG tablet   Oral   Take 10-30 mg by mouth daily. 11 day taper started 06/07/13; 3 tabs daily for 3 days, then 2 tabs daily for 3 days, then 1 tab daily for 5 days.         . propranolol (INDERAL) 80 MG tablet   Oral   Take 80 mg by mouth 2 (two) times daily.          BP 132/83  Pulse 102  Temp(Src) 97.7 F (36.5 C) (Oral)  Resp 18  SpO2 98% Physical Exam  Nursing note and vitals reviewed. Constitutional: He is oriented to person, place, and time. He appears well-developed and well-nourished. No distress.  HENT:  Head: Normocephalic and  atraumatic.  Eyes: EOM are normal.  Neck: Neck supple. No tracheal deviation present.  Cardiovascular: Normal rate.   Pulmonary/Chest: Effort normal. No respiratory distress.  Musculoskeletal: Normal range of motion.  Neurological: He is alert and oriented to person, place, and time.  Skin: Skin is warm and dry.  Right axilla remarkable for a 1 by 1 cm indurated abscess, no active drainage, non-fluctuant   Psychiatric: He has a normal mood and affect. His behavior is normal.    ED Course  Procedures  COORDINATION OF CARE:  Nursing notes reviewed. Vital signs reviewed. Initial pt interview and examination performed.   5:05 PM  INCISION AND DRAINAGE Performed by: Ivar Drape PA-C Consent: Verbal consent obtained. Risks and benefits: risks, benefits and alternatives were discussed Time out performed prior to procedure Type: abscess Body area: right axilla Anesthesia: local infiltration Incision was made with a scalpel. Local anesthetic: lidocaine 2% with epinephrine Anesthetic total: 10 ml Complexity: complex Blunt dissection to break up loculations Drainage: purulent Drainage amount: moderate Packing material: none Patient tolerance: Patient tolerated the procedure well with no immediate complications.     Treatment plan initiated: Medications  LORazepam (ATIVAN) tablet 1 mg (1 mg Oral Given 10/23/13 1726)     Initial diagnostic testing ordered.    Labs Review Labs Reviewed - No data to display Imaging Review No results found.  EKG Interpretation   None       MDM   1. Abscess    Patient with skin abscess amenable to incision and drainage.  Abscess was not large enough to warrant packing or drain,  wound recheck in 2 days. Encouraged home warm soaks and flushing.  Mild signs of cellulitis is surrounding skin.  Will d/c to home.    I personally performed the services described in this documentation, which was scribed in my presence. The recorded  information has been reviewed and is accurate.     Roxy Horseman, PA-C 10/23/13 2313

## 2013-11-03 ENCOUNTER — Other Ambulatory Visit: Payer: Self-pay

## 2013-11-09 ENCOUNTER — Other Ambulatory Visit: Payer: Self-pay | Admitting: Internal Medicine

## 2013-11-17 ENCOUNTER — Other Ambulatory Visit: Payer: Self-pay | Admitting: Internal Medicine

## 2013-11-17 ENCOUNTER — Other Ambulatory Visit: Payer: Self-pay | Admitting: Emergency Medicine

## 2014-02-02 DIAGNOSIS — R7303 Prediabetes: Secondary | ICD-10-CM

## 2014-02-02 DIAGNOSIS — F329 Major depressive disorder, single episode, unspecified: Secondary | ICD-10-CM | POA: Insufficient documentation

## 2014-02-02 DIAGNOSIS — F32A Depression, unspecified: Secondary | ICD-10-CM | POA: Insufficient documentation

## 2014-02-03 ENCOUNTER — Encounter: Payer: Self-pay | Admitting: Physician Assistant

## 2014-02-03 ENCOUNTER — Ambulatory Visit (INDEPENDENT_AMBULATORY_CARE_PROVIDER_SITE_OTHER): Payer: Self-pay | Admitting: Physician Assistant

## 2014-02-03 VITALS — BP 140/80 | HR 68 | Temp 98.2°F | Resp 16 | Ht 77.0 in | Wt 291.0 lb

## 2014-02-03 DIAGNOSIS — A64 Unspecified sexually transmitted disease: Secondary | ICD-10-CM

## 2014-02-03 DIAGNOSIS — R7303 Prediabetes: Secondary | ICD-10-CM

## 2014-02-03 DIAGNOSIS — R7309 Other abnormal glucose: Secondary | ICD-10-CM

## 2014-02-03 LAB — CBC WITH DIFFERENTIAL/PLATELET
Basophils Absolute: 0 10*3/uL (ref 0.0–0.1)
Basophils Relative: 0 % (ref 0–1)
Eosinophils Absolute: 0.2 10*3/uL (ref 0.0–0.7)
Eosinophils Relative: 2 % (ref 0–5)
HCT: 45.3 % (ref 39.0–52.0)
HEMOGLOBIN: 16.1 g/dL (ref 13.0–17.0)
LYMPHS ABS: 1.7 10*3/uL (ref 0.7–4.0)
LYMPHS PCT: 22 % (ref 12–46)
MCH: 30.7 pg (ref 26.0–34.0)
MCHC: 35.5 g/dL (ref 30.0–36.0)
MCV: 86.5 fL (ref 78.0–100.0)
MONOS PCT: 9 % (ref 3–12)
Monocytes Absolute: 0.7 10*3/uL (ref 0.1–1.0)
NEUTROS PCT: 67 % (ref 43–77)
Neutro Abs: 4.9 10*3/uL (ref 1.7–7.7)
PLATELETS: 215 10*3/uL (ref 150–400)
RBC: 5.24 MIL/uL (ref 4.22–5.81)
RDW: 14 % (ref 11.5–15.5)
WBC: 7.4 10*3/uL (ref 4.0–10.5)

## 2014-02-03 LAB — BASIC METABOLIC PANEL WITH GFR
BUN: 12 mg/dL (ref 6–23)
CALCIUM: 9.2 mg/dL (ref 8.4–10.5)
CO2: 27 meq/L (ref 19–32)
Chloride: 102 mEq/L (ref 96–112)
Creat: 0.89 mg/dL (ref 0.50–1.35)
GFR, Est Non African American: >89 mL/min
Glucose, Bld: 95 mg/dL (ref 70–99)
POTASSIUM: 4.1 meq/L (ref 3.5–5.3)
SODIUM: 137 meq/L (ref 135–145)

## 2014-02-03 LAB — RPR

## 2014-02-03 LAB — HEMOGLOBIN A1C
Hgb A1c MFr Bld: 5.1 % (ref <5.7)
Mean Plasma Glucose: 100 mg/dL (ref <117)

## 2014-02-03 LAB — HIV ANTIBODY (ROUTINE TESTING W REFLEX): HIV: NONREACTIVE

## 2014-02-03 MED ORDER — FLUCONAZOLE 150 MG PO TABS: 150.0000 mg | ORAL_TABLET | Freq: Every day | ORAL | Status: DC

## 2014-02-03 MED ORDER — HYDROCHLOROTHIAZIDE 25 MG PO TABS: 25.0000 mg | ORAL_TABLET | Freq: Every day | ORAL | Status: DC

## 2014-02-03 MED ORDER — AZITHROMYCIN 250 MG PO TABS: ORAL_TABLET | ORAL | Status: AC

## 2014-02-03 MED ORDER — AMOXICILLIN 500 MG PO TABS: ORAL_TABLET | ORAL | Status: DC

## 2014-02-03 MED ORDER — METFORMIN HCL 500 MG PO TABS: ORAL_TABLET | ORAL | Status: DC

## 2014-02-03 MED ORDER — LORAZEPAM 1 MG PO TABS: ORAL_TABLET | ORAL | Status: DC

## 2014-02-03 NOTE — Progress Notes (Signed)
HPI Patient is self pay and has prediabetes. He also is homosexual and has a steady partner in [place]. However he has been having urinary symptoms intermittently for 6 months and for the past week he has been having white pus from his penis and it has been painful to urinate. He has had STD testing in the past just for HIV and it was a long time a go. He does not check his sugar at home. BP at home runs 125/60, he has not take all of his medications this AM. He is out of Metformin and HCTZ and Ativan.   Past Medical History  Diagnosis Date  . Hidradenitis suppurativa   . SVT (supraventricular tachycardia)   . Hypertension   . Depression   . Anxiety   . Anxiety   . Prediabetes      Allergies  Allergen Reactions  . Peanut-Containing Drug Products Anaphylaxis  . Ceclor [Cefaclor] Hives and Swelling  . Sulfa Antibiotics Hives      Current Outpatient Prescriptions on File Prior to Visit  Medication Sig Dispense Refill  . aspirin EC 81 MG tablet Take 81 mg by mouth 2 (two) times daily.      . cloNIDine (CATAPRES) 0.1 MG tablet Take 0.05 mg by mouth 3 (three) times daily.      . fish oil-omega-3 fatty acids 1000 MG capsule Take 1 g by mouth 2 (two) times daily.      . Flaxseed, Linseed, (FLAX SEED OIL PO) Take 1 tablet by mouth 2 (two) times daily.      . hydrochlorothiazide (HYDRODIURIL) 25 MG tablet Take 25 mg by mouth daily.      Marland Kitchen. lisinopril (PRINIVIL,ZESTRIL) 20 MG tablet Take 20 mg by mouth 2 (two) times daily.       Marland Kitchen. LORazepam (ATIVAN) 1 MG tablet take 1/2 to 1 tablet by mouth 3 times daily as needed for anxiety.  90 tablet  1  . metFORMIN (GLUCOPHAGE) 500 MG tablet TAKE ONE TABLET BY MOUTH TWICE DAILY   180 tablet  0  . omeprazole (PRILOSEC) 20 MG capsule Take 20 mg by mouth daily.      . propranolol (INDERAL) 80 MG tablet Take 80 mg by mouth 3 (three) times daily.        No current facility-administered medications on file prior to visit.    ROS: all negative expect above.    Physical: Filed Weights   02/03/14 0840  Weight: 291 lb (131.997 kg)   Filed Vitals:   02/03/14 0840  BP: 140/80  Pulse: 68  Temp: 98.2 F (36.8 C)  Resp: 16   General Appearance: Well nourished, in no apparent distress. Eyes: PERRLA, EOMs. Sinuses: No Frontal/maxillary tenderness ENT/Mouth: Ext aud canals clear, normal light reflex with TMs without erythema, bulging. Post pharynx without erythema, swelling, exudate.  Respiratory: CTAB Cardio: RRR, no murmurs, rubs or gallops. Peripheral pulses brisk and equal bilaterally, without edema. No aortic or femoral bruits. Abdomen: Soft, with bowl sounds. Nontender, no guarding, rebound. Lymphatics: Non tender without lymphadenopathy.  Gynecological : normal, abnormal findings: balanitis or urethral discharge Musculoskeletal: Full ROM all peripheral extremities, 5/5 strength, and normal gait. Skin: Warm, dry without rashes, lesions, ecchymosis.  Neuro: Cranial nerves intact, reflexes equal bilaterally. Normal muscle tone, no cerebellar symptoms. Sensation intact.  Pysch: Awake and oriented X 3, normal affect, Insight and Judgment appropriate.   Assessment and Plan: 1) HTN- monitor at home,  2) DM- called in metformin, check A1C 3) STD testing- long  discussion about health department or getting tested here. I have explained that it would be much cheaper for him to go to the health department but he is insistent on getting tested here. I have informed him the bill will be at least $200 or more. He states he understands and will be able to pay for it.   Amoxicillin 4mg   Azithromycin 2mg    Pending testing  If worse over weekend go to ER or go to health department.

## 2014-02-03 NOTE — Patient Instructions (Signed)
Gonorrhea Gonorrhea is an infection that can cause serious problems. If left untreated, may   Damage the male or male organs.   Cause women to be unable to have children (sterility).   Harm a fetus, if the infected woman is pregnant.  It is important to get treatment for gonorrhea as soon as possible. It is also necessary that all your sexual partners be tested for the infection.  CAUSES  Gonorrhea is caused by bacteria called Neisseria gonorrhoeae. The infection is spread from person to person, usually by sexual contact (such as by anal, vaginal, or oral means). A newborn can contract the infection from his or her mother during birth.  SYMPTOMS  Some people with gonorrhea do not have symptoms. Symptoms may be different in females and males.  Females The most common symptoms are:   Pain in the lower abdomen.   Fever with or without chills.  Other symptoms include:   Abnormal vaginal discharge.   Painful intercourse.   Burning or itching of the vagina or lips of the vagina.   Abnormal vaginal bleeding.   Pain when urinating.   Long-lasting (chronic) pain in the lower abdomen, especially during menstruation or intercourse.   Inability to become pregnant.   Going into premature labor.   Irritation, pain, bleeding, or discharge from the rectum. This may occur if the infection was spread by anal sex.   Sore throat or swollen neck lymph nodes. This may occur if the infection was spread by oral sex.  Males The most common symptoms are:   Discharge from the penis.   Pain or burning during urination.   Pain or swelling in the testicles. Other symptoms may include:   Irritation, pain, bleeding, or discharge from the rectum. This may occur if the infection was spread by anal sex.   Sore throat, fever, or swollen neck lymph nodes. This may occur if the infection was spread by oral sex.  DIAGNOSIS  A diagnosis is made after a physical exam is done and a  sample of discharge is examined under a microscope for the presence of the bacteria. The discharge may be taken from the urethra, cervix, throat, or rectum.  TREATMENT  Gonorrhea is treated with antibiotic medicines. It is important for treatment to begin as soon as possible. Early treatment may prevent some problems from developing.  HOME CARE INSTRUCTIONS   Only take over-the-counter or prescription medicines for pain, fever, or discomfort as directed by your health care provider.   Take antibiotics as directed. Make sure you finish them even if you start to feel better. Incomplete treatment will put you at risk for continued infection.   Do not have sex until treatment is complete or as directed by your health care provider.   Follow up with your health care provider as directed.   Not all test results are available during your visit. If your test results are not back during the visit, make an appointment with your health care provider to find out the results. Do not assume everything is normal if you have not heard from your health care provider or the medical facility. It is important for you to follow up on all of your test results.   If you test positive for gonorrhea, inform your recent sexual partners. They need to be checked for gonorrhea even if they do not have symptoms. They may need treatment, even if they test negative for gonorrhea.  SEEK MEDICAL CARE IF:   You develop any bad  reaction to the medicine you were prescribed. This may include:   A rash.   Nausea.   Vomiting.   Diarrhea.   Your symptoms do not improve after a few days of taking antibiotics.   Your symptoms get worse.   You develop increased pain, such as in the testicles (for males) or in the abdomen (for females).  SEEK IMMEDIATE MEDICAL CARE IF:  You have a fever or persistent symptoms for more than 2 3 days.   You have a fever and your symptoms suddenly get worse.  MAKE SURE YOU:    Understand these instructions.  Will watch your condition.  Will get help right away if you are not doing well or get worse. Document Released: 12/12/2000 Document Revised: 10/05/2013 Document Reviewed: 06/22/2013 [facility] Patient Information 2014 [place], [place]. Chlamydia, Females and Males Chlamydia is an infection that can be found in the vagina, urethra, cervix, rectum and pelvic organs in the male. In the male, it most often causes urethritis. This happens when it infects the tube (urethra) that carries the urine out of the bladder. When Chlamydia causes urethritis, there may be burning with urination. In males, it may also infect the tubes that carry the sperm from the testicle. This causes pain in the testicles and infect the prostate gland. In females, an infection of the pelvic organs is also called PID (pelvic inflammatory disease). PID may be a cause of sudden (acute) lower abdominal/belly (pelvic) pain and fever. But with Chlamydia, the infection sometimes does not cause problems that you notice (asymptomatic). It may cause an abnormal or watery mucous-like discharge from the birth canal (vagina) or penis.  CAUSES  Chlamydia is caused by germs (bacteria) that are spread during sexual contact of the:  Genitals.  Mouth.  Rectum. This infection may also be passed to a newborn baby coming through the infected birth canal. This causes eye and lung infections in the baby. Chlamydia often goes unnoticed. So it is easy to transmit it to a sexual partner without even knowing. SYMPTOMS  In females, symptoms may go unnoticed. Symptoms that are more noticeable can include:  Belly (abdominal) pain.  Painful intercourse.  Watery mucous-like discharge from the vagina.  Miscarriage.  Discomfort when urinating.  Inflammation of the rectum. In males, symptoms include:  Burning with urination.  Pain in the testicles.  Watery mucous-like discharge from the penis. It can cause  longstanding (chronic) pelvic pain after frequent infections. TREATMENT   Chlamydia can be treated with medications which kill germs(antibiotics).  Inform all sexual partners about the infection. All sexual contacts need to be treated.  If you are pregnant, do not take tetracycline type antibiotics.  PID can cause women to not be able to have children (sterile) if left untreated or if half-treated. It does this by scarring the tubes to the uterus (fallopian tubes). They carry the egg needed to form a baby. It is important to finish ALL medications given to you.  Sterility or future tubal (ectopic) pregnancies can occur in fully treated individuals. It is important to follow your prescribed treatment. That will lessen the chances of these problems.  This is a sexually transmitted infection. So you are also at risk for other sexually transmitted diseases. These include: Gonorrhea and HIV (AIDS). Testing may be done for the other sexually transmitted diseases if one disease is detected.  It is important to treat chlamydia as soon as possible. It can cause damage to other organs. HOME CARE INSTRUCTIONS  Finish all medication  as prescribed. Incomplete treatment will put you at risk for not being able to have children (sterility) and tubal pregnancy. If one sexually transmitted disease is discovered, often treatment will be started to cover other possible infections.  Only take over-the-counter or prescription medicines for pain, discomfort, or fever as directed by your caregiver.  Rest.  Eat a balanced diet and drink plenty of fluids.  Warning: This infection is contagious. Do not have sex until treatment is completed. Follow up at your caregiver's office or the clinic to which you were referred. If your diagnosis (learning what is wrong) is confirmed by culture or some other method, your recent sexual contacts need treatment. Even if they are symptom free or have a negative culture or  evaluation, they should be treated.  For the protection of your privacy, test results can not be given over the phone. Make sure you receive the results of your test. Ask how these results are to be obtained if you have not been informed. It is your responsibility to obtain your test results. PREVENTION   Women should use sanitary pads instead of tampons for vaginal discharge.  Wipe front to back after using the toilet and avoid douching.  Test for chlamydia if you are having an IUD inserted.  Practice safe sex, use condoms, have only one sex partner and be sure your sex partner is not having sex with others.  Ask your caregiver to test you for chlamydia at your regular checkups or sooner if you are having symptoms.  Ask for further information if you are pregnant. SEEK IMMEDIATE MEDICAL CARE IF:   You develop an oral temperature above 102 F (38.9 C), not controlled by medications or lasting more than 2 days.  You develop an increase in pain.  You develop any type of abnormal discharge.  You develop vaginal bleeding and it is not time for your period.  You develop painful intercourse. MAKE SURE YOU:   Understand these instructions.  Will watch your condition.  Will get help right away if you are not doing well or get worse. Document Released: 12/15/2005 Document Revised: 03/08/2012 Document Reviewed: 06/23/2013 [facility] Patient Information 2014 [place], [place].

## 2014-02-06 LAB — GC/CHLAMYDIA PROBE AMP, URINE
CHLAMYDIA, SWAB/URINE, PCR: POSITIVE — AB
GC PROBE AMP, URINE: POSITIVE — AB

## 2014-02-07 ENCOUNTER — Telehealth: Payer: Self-pay

## 2014-02-07 NOTE — Telephone Encounter (Signed)
Received call from patient regarding lab results, his mychart was active but he says he did not sign up and declines, I deactivated his mychart account and advised him on his lab results, his results were normal all but his GC/chlamydia, he has finished his antibiotics and he was advised that if he was having any other symptoms he would need to follow up with the health dept. Reported to health dept today via fax to [phone]

## 2014-02-27 ENCOUNTER — Other Ambulatory Visit: Payer: Self-pay | Admitting: Emergency Medicine

## 2014-03-03 ENCOUNTER — Other Ambulatory Visit: Payer: Self-pay | Admitting: Physician Assistant

## 2014-03-30 ENCOUNTER — Encounter: Payer: Self-pay | Admitting: Physician Assistant

## 2014-03-30 ENCOUNTER — Other Ambulatory Visit: Payer: Self-pay | Admitting: Physician Assistant

## 2014-03-30 ENCOUNTER — Ambulatory Visit (INDEPENDENT_AMBULATORY_CARE_PROVIDER_SITE_OTHER): Payer: BC Managed Care – PPO | Admitting: Physician Assistant

## 2014-03-30 VITALS — BP 110/62 | HR 68 | Temp 98.1°F | Resp 16 | Ht 77.0 in | Wt 282.0 lb

## 2014-03-30 DIAGNOSIS — R0609 Other forms of dyspnea: Secondary | ICD-10-CM

## 2014-03-30 DIAGNOSIS — R197 Diarrhea, unspecified: Secondary | ICD-10-CM

## 2014-03-30 DIAGNOSIS — N390 Urinary tract infection, site not specified: Secondary | ICD-10-CM

## 2014-03-30 DIAGNOSIS — G8929 Other chronic pain: Secondary | ICD-10-CM

## 2014-03-30 DIAGNOSIS — N3000 Acute cystitis without hematuria: Secondary | ICD-10-CM

## 2014-03-30 DIAGNOSIS — Z113 Encounter for screening for infections with a predominantly sexual mode of transmission: Secondary | ICD-10-CM

## 2014-03-30 DIAGNOSIS — R0989 Other specified symptoms and signs involving the circulatory and respiratory systems: Secondary | ICD-10-CM

## 2014-03-30 DIAGNOSIS — R0600 Dyspnea, unspecified: Secondary | ICD-10-CM

## 2014-03-30 DIAGNOSIS — I10 Essential (primary) hypertension: Secondary | ICD-10-CM

## 2014-03-30 DIAGNOSIS — R1031 Right lower quadrant pain: Secondary | ICD-10-CM

## 2014-03-30 LAB — CBC WITH DIFFERENTIAL/PLATELET
Basophils Absolute: 0.1 10*3/uL (ref 0.0–0.1)
Basophils Relative: 1 % (ref 0–1)
Eosinophils Absolute: 0.2 10*3/uL (ref 0.0–0.7)
Eosinophils Relative: 3 % (ref 0–5)
HCT: 43.7 % (ref 39.0–52.0)
Hemoglobin: 15.2 g/dL (ref 13.0–17.0)
LYMPHS ABS: 2.4 10*3/uL (ref 0.7–4.0)
Lymphocytes Relative: 37 % (ref 12–46)
MCH: 29.9 pg (ref 26.0–34.0)
MCHC: 34.8 g/dL (ref 30.0–36.0)
MCV: 86 fL (ref 78.0–100.0)
Monocytes Absolute: 0.5 10*3/uL (ref 0.1–1.0)
Monocytes Relative: 7 % (ref 3–12)
NEUTROS PCT: 52 % (ref 43–77)
Neutro Abs: 3.4 10*3/uL (ref 1.7–7.7)
PLATELETS: 194 10*3/uL (ref 150–400)
RBC: 5.08 MIL/uL (ref 4.22–5.81)
RDW: 14.1 % (ref 11.5–15.5)
WBC: 6.5 10*3/uL (ref 4.0–10.5)

## 2014-03-30 MED ORDER — HYOSCYAMINE SULFATE 0.125 MG SL SUBL: 0.1250 mg | SUBLINGUAL_TABLET | SUBLINGUAL | Status: DC | PRN

## 2014-03-30 NOTE — Patient Instructions (Signed)
Abdominal Pain, Adult °Many things can cause abdominal pain. Usually, abdominal pain is not caused by a disease and will improve without treatment. It can often be observed and treated at home. Your health care provider will do a physical exam and possibly order blood tests and X-rays to help determine the seriousness of your pain. However, in many cases, more time must pass before a clear cause of the pain can be found. Before that point, your health care provider may not know if you need more testing or further treatment. °HOME CARE INSTRUCTIONS  °Monitor your abdominal pain for any changes. The following actions may help to alleviate any discomfort you are experiencing: °· Only take over-the-counter or prescription medicines as directed by your health care provider. °· Do not take laxatives unless directed to do so by your health care provider. °· Try a clear liquid diet (broth, tea, or water) as directed by your health care provider. Slowly move to a bland diet as tolerated. °SEEK MEDICAL CARE IF: °· You have unexplained abdominal pain. °· You have abdominal pain associated with nausea or diarrhea. °· You have pain when you urinate or have a bowel movement. °· You experience abdominal pain that wakes you in the night. °· You have abdominal pain that is worsened or improved by eating food. °· You have abdominal pain that is worsened with eating fatty foods. °SEEK IMMEDIATE MEDICAL CARE IF:  °· Your pain does not go away within 2 hours. °· You have a fever. °· You keep throwing up (vomiting). °· Your pain is felt only in portions of the abdomen, such as the right side or the left lower portion of the abdomen. °· You pass bloody or black tarry stools. °MAKE SURE YOU: °· Understand these instructions.   °· Will watch your condition.   °· Will get help right away if you are not doing well or get worse.   °Document Released: 09/24/2005 Document Revised: 10/05/2013 Document Reviewed: 08/24/2013 °[employer]® Patient  Information ©2014 [employer]. ° °

## 2014-03-30 NOTE — Progress Notes (Signed)
   Subjective:    Patient ID: Spencer Love, male    DOB: 05/05/1986, 28 y.o.   MRN: [id number]  Abdominal Pain This is a new problem. Episode onset: 3 weeks. The onset quality is gradual. The problem occurs constantly. The problem has been unchanged. The pain is located in the right flank and RLQ. The quality of the pain is sharp. The abdominal pain radiates to the right flank. Associated symptoms include arthralgias (hips, knees, ankles), belching and diarrhea. Pertinent negatives include no anorexia, constipation, dysuria, fever, flatus, frequency, headaches, hematochezia, hematuria, melena, myalgias, nausea, vomiting or weight loss. The pain is aggravated by movement and palpation. Relieved by: Bowel movements. sister with UC   Patient with a history of SOB with exertion, HTN, states that he has some SOB with exertion. He has a family history of bicuspid valves and he would like an echo.   Review of Systems  Constitutional: Negative for fever, chills, weight loss, diaphoresis, activity change, appetite change and fatigue.  HENT: Negative.   Respiratory: Negative.   Cardiovascular: Negative.   Gastrointestinal: Positive for abdominal pain and diarrhea. Negative for nausea, vomiting, constipation, blood in stool, melena, hematochezia, abdominal distention, anal bleeding, rectal pain, anorexia and flatus.  Genitourinary: Negative.  Negative for dysuria, frequency and hematuria.  Musculoskeletal: Positive for arthralgias (hips, knees, ankles). Negative for back pain, gait problem, joint swelling, myalgias, neck pain and neck stiffness.  Skin: Negative.   Neurological: Negative.  Negative for headaches.  Hematological: Negative.   Psychiatric/Behavioral: Negative.        Objective:   Physical Exam  Constitutional: He is oriented to person, place, and time. He appears well-developed and well-nourished.  HENT:  Head: Normocephalic and atraumatic.  Eyes: Conjunctivae are normal. Pupils are  equal, round, and reactive to light.  Neck: Normal range of motion. Neck supple.  Cardiovascular: Normal rate and regular rhythm.   Pulmonary/Chest: Effort normal and breath sounds normal.  Abdominal: Soft. He exhibits no distension and no mass. There is tenderness (RLQ). There is rebound (mild, and questionable) and guarding.  Musculoskeletal: Normal range of motion.  Neurological: He is alert and oriented to person, place, and time.  Skin: Skin is warm and dry. No rash noted.       Assessment & Plan:  Right sided AB pain- mild rebound tenderness- get CT scan to rule out kidney stone, appendicitis, diverticultitis Levsin, bland diet, increase fluids, go to the ER if worse  SOB- get echo, refer to cardiology, if worse go to ER.   OVER 30 minutes of exam, counseling, chart review, referral performed

## 2014-03-31 LAB — URINALYSIS, ROUTINE W REFLEX MICROSCOPIC
Bilirubin Urine: NEGATIVE
GLUCOSE, UA: NEGATIVE mg/dL
Hgb urine dipstick: NEGATIVE
Ketones, ur: NEGATIVE mg/dL
LEUKOCYTES UA: NEGATIVE
NITRITE: NEGATIVE
Protein, ur: NEGATIVE mg/dL
Specific Gravity, Urine: 1.022 (ref 1.005–1.030)
Urobilinogen, UA: 0.2 mg/dL (ref 0.0–1.0)
pH: 6.5 (ref 5.0–8.0)

## 2014-03-31 LAB — HEPATIC FUNCTION PANEL
ALT: 22 U/L (ref 0–53)
AST: 16 U/L (ref 0–37)
Albumin: 4.5 g/dL (ref 3.5–5.2)
Alkaline Phosphatase: 40 U/L (ref 39–117)
BILIRUBIN DIRECT: 0.1 mg/dL (ref 0.0–0.3)
Indirect Bilirubin: 0.4 mg/dL (ref 0.2–1.2)
Total Bilirubin: 0.5 mg/dL (ref 0.2–1.2)
Total Protein: 6.5 g/dL (ref 6.0–8.3)

## 2014-03-31 LAB — BASIC METABOLIC PANEL WITH GFR
BUN: 16 mg/dL (ref 6–23)
CO2: 28 meq/L (ref 19–32)
Calcium: 9.4 mg/dL (ref 8.4–10.5)
Chloride: 102 mEq/L (ref 96–112)
Creat: 0.82 mg/dL (ref 0.50–1.35)
GFR, Est Non African American: >89 mL/min
Glucose, Bld: 87 mg/dL (ref 70–99)
Potassium: 4.3 mEq/L (ref 3.5–5.3)
SODIUM: 139 meq/L (ref 135–145)

## 2014-03-31 LAB — SEDIMENTATION RATE: Sed Rate: 1 mm/hr (ref 0–16)

## 2014-03-31 LAB — MICROALBUMIN / CREATININE URINE RATIO
CREATININE, URINE: 179.6 mg/dL
MICROALB UR: 1.37 mg/dL (ref 0.00–1.89)
MICROALB/CREAT RATIO: 7.6 mg/g (ref 0.0–30.0)

## 2014-04-01 LAB — URINE CULTURE
COLONY COUNT: NO GROWTH
Organism ID, Bacteria: NO GROWTH

## 2014-04-03 ENCOUNTER — Other Ambulatory Visit: Payer: Self-pay

## 2014-04-03 LAB — HSV(HERPES SIMPLEX VRS) I + II AB-IGG
HSV 1 Glycoprotein G Ab, IgG: 0.1 IV
HSV 2 Glycoprotein G Ab, IgG: <0.1 IV

## 2014-04-04 ENCOUNTER — Other Ambulatory Visit: Payer: Self-pay | Admitting: Emergency Medicine

## 2014-04-05 ENCOUNTER — Ambulatory Visit
Admission: RE | Admit: 2014-04-05 | Discharge: 2014-04-05 | Disposition: A | Discharge status: Home / Self Care | Payer: BC Managed Care – PPO | Source: Ambulatory Visit | Attending: Physician Assistant | Admitting: Physician Assistant

## 2014-04-05 ENCOUNTER — Other Ambulatory Visit: Payer: Self-pay

## 2014-04-05 DIAGNOSIS — R197 Diarrhea, unspecified: Secondary | ICD-10-CM

## 2014-04-06 ENCOUNTER — Other Ambulatory Visit: Payer: Self-pay | Admitting: Emergency Medicine

## 2014-04-07 ENCOUNTER — Telehealth: Payer: Self-pay

## 2014-04-07 NOTE — Telephone Encounter (Signed)
Patient called for CT results, gave him results and advised him to follow up if still having issues, wanted to know if he should take the medication Marchelle Folksmanda prescribed, advised him if still having issues he needs to take the RX and follow up if needed.

## 2014-04-14 ENCOUNTER — Other Ambulatory Visit: Payer: Self-pay | Admitting: Physician Assistant

## 2014-04-14 ENCOUNTER — Other Ambulatory Visit: Payer: Self-pay | Admitting: Internal Medicine

## 2014-04-18 ENCOUNTER — Encounter: Payer: Self-pay | Admitting: Cardiology

## 2014-04-18 ENCOUNTER — Ambulatory Visit (INDEPENDENT_AMBULATORY_CARE_PROVIDER_SITE_OTHER): Payer: BC Managed Care – PPO | Admitting: Cardiology

## 2014-04-18 VITALS — BP 124/86 | HR 52 | Ht 77.0 in | Wt 280.0 lb

## 2014-04-18 DIAGNOSIS — R0683 Snoring: Secondary | ICD-10-CM

## 2014-04-18 DIAGNOSIS — R0989 Other specified symptoms and signs involving the circulatory and respiratory systems: Secondary | ICD-10-CM

## 2014-04-18 DIAGNOSIS — R0609 Other forms of dyspnea: Secondary | ICD-10-CM

## 2014-04-18 DIAGNOSIS — R0602 Shortness of breath: Secondary | ICD-10-CM

## 2014-04-18 NOTE — Progress Notes (Signed)
HPI The patient presents for evaluation of dyspnea.  He has a history of bicuspid aortic stenosis with both parents.  He has had no prior cardiac diagnosis however.  He has been having increased dyspnea with activity such as walking fifty yards on level ground.   The patient denies any new symptoms neck or arm discomfort. There has been no new shortness of breath, PND or orthopnea. There have been no reported palpitations, presyncope or syncope.  He does snore loudly.  He does have an active job and no acute symptoms with this.  He has some vague lower chest discomfort or epigastric discomfort.  However, he cannot quantify or qualify this and he has no associated symptoms.  It does not come with activity.    Allergies  Allergen Reactions  . Peanut-Containing Drug Products Anaphylaxis  . Ceclor [Cefaclor] Hives and Swelling  . Sulfa Antibiotics Hives    Current Outpatient Prescriptions  Medication Sig Dispense Refill  . aspirin EC 81 MG tablet Take 81 mg by mouth 2 (two) times daily.      . cloNIDine (CATAPRES) 0.1 MG tablet Take 0.05 mg by mouth 3 (three) times daily.      . fish oil-omega-3 fatty acids 1000 MG capsule Take 1 g by mouth 2 (two) times daily.      . Flaxseed, Linseed, (FLAX SEED OIL PO) Take 1 tablet by mouth 2 (two) times daily.      . hydrochlorothiazide (HYDRODIURIL) 25 MG tablet Take 1 tablet (25 mg total) by mouth daily.  90 tablet  1  . hyoscyamine (LEVSIN/SL) 0.125 MG SL tablet Place 1 tablet (0.125 mg total) under the tongue every 4 (four) hours as needed.  30 tablet  0  . lisinopril (PRINIVIL,ZESTRIL) 20 MG tablet TAKE ONE TABLET BY MOUTH TWICE DAILY   180 tablet  1  . LORazepam (ATIVAN) 1 MG tablet TAKE ONE HALF TO ONE TABLET BY MOUTH THREE TIMES DAILY AS NEEDED   90 tablet  0  . metFORMIN (GLUCOPHAGE) 500 MG tablet TAKE ONE TABLET BY MOUTH TWICE DAILY  180 tablet  1  . omeprazole (PRILOSEC) 20 MG capsule Take 20 mg by mouth daily.      . propranolol (INDERAL) 80 MG  tablet TAKE ONE TABLET BY MOUTH THREE TIMES DAILY   270 tablet  1   No current facility-administered medications for this visit.    Past Medical History  Diagnosis Date  . Hidradenitis suppurativa   . Hypertension   . Depression   . Anxiety   . Prediabetes     Past Surgical History  Procedure Laterality Date  . None      Family History  Problem Relation Age of Onset  . Heart disease Mother     Bicuspid aortic valve  . Heart disease Sister     Bicuspid aortic valve  . Colitis Sister   . Heart disease Father     Bicuspid aortic valve    History   Social History  . Marital Status: Single    Spouse Name: N/A    Number of Children: N/A  . Years of Education: N/A   Occupational History  . Not on file.   Social History Main Topics  . Smoking status: Current Every Day Smoker    Types: Cigarettes  . Smokeless tobacco: Not on file  . Alcohol Use: Yes     Comment: social  . Drug Use: Yes    Special: Marijuana  . Sexual Activity: Not  on file   Other Topics Concern  . Not on file   Social History Narrative   Lives alone.      ROS:  Headaches, reflux, cramps.  Otherwise as stated in the HPI and negative for all other systems.  PHYSICAL EXAM There were no vitals taken for this visit. GENERAL:  Well appearing HEENT:  Pupils equal round and reactive, fundi not visualized, oral mucosa unremarkable NECK:  No jugular venous distention, waveform within normal limits, carotid upstroke brisk and symmetric, no bruits, no thyromegaly LYMPHATICS:  No cervical, inguinal adenopathy LUNGS:  Clear to auscultation bilaterally BACK:  No CVA tenderness CHEST:  Unremarkable HEART:  PMI not displaced or sustained,S1 and S2 within normal limits, no S3, no S4, no clicks, no rubs, no murmurs ABD:  Flat, positive bowel sounds normal in frequency in pitch, no bruits, no rebound, no guarding, no midline pulsatile mass, no hepatomegaly, no splenomegaly EXT:  2 plus pulses throughout, no  edema, no cyanosis no clubbing SKIN:  No rashes no nodules NEURO:  Cranial nerves II through XII grossly intact, motor grossly intact throughout PSYCH:  Cognitively intact, oriented to person place and time  EKG:  Sinus rhythm, rate 56, axis within normal limits, intervals within normal limits, no acute ST-T wave changes.  04/18/2014   ASSESSMENT AND PLAN  DYSPNEA:   This is probably related to smoking.  However, I will check a BNP.  If this is normal no echo will be indicated.  He does not have an exam suggesting aortic stenosis.  I will likely bring him back for a POET (Plain Old Exercise Treadmill)  HTN:  His BP is well controlled.  He will continue the meds as listed.  I will work up sleep apnea  SNORING:   He has snoring, daytime somnolence and a high pretest probability of sleep apnea.  I will send him for screening  TOBACCO: He understands the need to stop smoking.

## 2014-04-18 NOTE — Patient Instructions (Addendum)
The current medical regimen is effective;  continue present plan and medications.  Please have blood work today (BNP) on the first floor.  [facility].  Your physician has recommended that you have a sleep study. This test records several body functions during sleep, including: brain activity, eye movement, oxygen and carbon dioxide blood levels, heart rate and rhythm, breathing rate and rhythm, the flow of air through your mouth and nose, snoring, body muscle movements, and chest and belly movement.  Follow up with Dr Antoine PocheHochrein in 2 months.  Smoking Cessation Quitting smoking is important to your health and has many advantages. However, it is not always easy to quit since nicotine is a very addictive drug. Often times, people try 3 times or more before being able to quit. This document explains the best ways for you to prepare to quit smoking. Quitting takes hard work and a lot of effort, but you can do it. ADVANTAGES OF QUITTING SMOKING  You will live longer, feel better, and live better.  Your body will feel the impact of quitting smoking almost immediately.  Within 20 minutes, blood pressure decreases. Your pulse returns to its normal level.  After 8 hours, carbon monoxide levels in the blood return to normal. Your oxygen level increases.  After 24 hours, the chance of having a heart attack starts to decrease. Your breath, hair, and body stop smelling like smoke.  After 48 hours, damaged nerve endings begin to recover. Your sense of taste and smell improve.  After 72 hours, the body is virtually free of nicotine. Your bronchial tubes relax and breathing becomes easier.  After 2 to 12 weeks, lungs can hold more air. Exercise becomes easier and circulation improves.  The risk of having a heart attack, stroke, cancer, or lung disease is greatly reduced.  After 1 year, the risk of coronary heart disease is cut in half.  After 5 years, the risk of stroke falls to the same as a  nonsmoker.  After 10 years, the risk of lung cancer is cut in half and the risk of other cancers decreases significantly.  After 15 years, the risk of coronary heart disease drops, usually to the level of a nonsmoker.  If you are pregnant, quitting smoking will improve your chances of having a healthy baby.  The people you live with, especially any children, will be healthier.  You will have extra money to spend on things other than cigarettes. QUESTIONS TO THINK ABOUT BEFORE ATTEMPTING TO QUIT You may want to talk about your answers with your caregiver.  Why do you want to quit?  If you tried to quit in the past, what helped and what did not?  What will be the most difficult situations for you after you quit? How will you plan to handle them?  Who can help you through the tough times? Your family? Friends? A caregiver?  What pleasures do you get from smoking? What ways can you still get pleasure if you quit? Here are some questions to ask your caregiver:  How can you help me to be successful at quitting?  What medicine do you think would be best for me and how should I take it?  What should I do if I need more help?  What is smoking withdrawal like? How can I get information on withdrawal? GET READY  Set a quit date.  Change your environment by getting rid of all cigarettes, ashtrays, matches, and lighters in your home, car, or work. Do not  let people smoke in your home.  Review your past attempts to quit. Think about what worked and what did not. GET SUPPORT AND ENCOURAGEMENT You have a better chance of being successful if you have help. You can get support in many ways.  Tell your family, friends, and co-workers that you are going to quit and need their support. Ask them not to smoke around you.  Get individual, group, or telephone counseling and support. Programs are available at [facility] and health centers. Call your local health department for information  about programs in your area.  Spiritual beliefs and practices may help some smokers quit.  Download a "quit meter" on your computer to keep track of quit statistics, such as how long you have gone without smoking, cigarettes not smoked, and money saved.  Get a self-help book about quitting smoking and staying off of tobacco. LEARN NEW SKILLS AND BEHAVIORS  Distract yourself from urges to smoke. Talk to someone, go for a walk, or occupy your time with a task.  Change your normal routine. Take a different route to work. Drink tea instead of coffee. Eat breakfast in a different place.  Reduce your stress. Take a hot bath, exercise, or read a book.  Plan something enjoyable to do every day. Reward yourself for not smoking.  Explore interactive web-based programs that specialize in helping you quit. GET MEDICINE AND USE IT CORRECTLY Medicines can help you stop smoking and decrease the urge to smoke. Combining medicine with the above behavioral methods and support can greatly increase your chances of successfully quitting smoking.  Nicotine replacement therapy helps deliver nicotine to your body without the negative effects and risks of smoking. Nicotine replacement therapy includes nicotine gum, lozenges, inhalers, nasal sprays, and skin patches. Some may be available over-the-counter and others require a prescription.  Antidepressant medicine helps people abstain from smoking, but how this works is unknown. This medicine is available by prescription.  Nicotinic receptor partial agonist medicine simulates the effect of nicotine in your brain. This medicine is available by prescription. Ask your caregiver for advice about which medicines to use and how to use them based on your health history. Your caregiver will tell you what side effects to look out for if you choose to be on a medicine or therapy. Carefully read the information on the package. Do not use any other product containing nicotine  while using a nicotine replacement product.  RELAPSE OR DIFFICULT SITUATIONS Most relapses occur within the first 3 months after quitting. Do not be discouraged if you start smoking again. Remember, most people try several times before finally quitting. You may have symptoms of withdrawal because your body is used to nicotine. You may crave cigarettes, be irritable, feel very hungry, cough often, get headaches, or have difficulty concentrating. The withdrawal symptoms are only temporary. They are strongest when you first quit, but they will go away within 10 14 days. To reduce the chances of relapse, try to:  Avoid drinking alcohol. Drinking lowers your chances of successfully quitting.  Reduce the amount of caffeine you consume. Once you quit smoking, the amount of caffeine in your body increases and can give you symptoms, such as a rapid heartbeat, sweating, and anxiety.  Avoid smokers because they can make you want to smoke.  Do not let weight gain distract you. Many smokers will gain weight when they quit, usually less than 10 pounds. Eat a healthy diet and stay active. You can always lose the weight gained  after you quit.  Find ways to improve your mood other than smoking. FOR MORE INFORMATION  www.smokefree.gov  Document Released: 12/09/2001 Document Revised: 06/15/2012 Document Reviewed: 03/25/2012 [facility] Patient Information 2014 [place], [place].

## 2014-04-28 ENCOUNTER — Ambulatory Visit (HOSPITAL_BASED_OUTPATIENT_CLINIC_OR_DEPARTMENT_OTHER): Payer: BC Managed Care – PPO | Attending: Cardiology

## 2014-04-28 DIAGNOSIS — G4733 Obstructive sleep apnea (adult) (pediatric): Secondary | ICD-10-CM | POA: Insufficient documentation

## 2014-04-28 DIAGNOSIS — R0683 Snoring: Secondary | ICD-10-CM

## 2014-04-28 DIAGNOSIS — R0602 Shortness of breath: Secondary | ICD-10-CM

## 2014-05-05 DIAGNOSIS — G4730 Sleep apnea, unspecified: Secondary | ICD-10-CM

## 2014-05-05 DIAGNOSIS — G4710 Hypersomnia, unspecified: Secondary | ICD-10-CM

## 2014-05-05 NOTE — Sleep Study (Signed)
   NAME: Spencer Love DATE OF BIRTH:  07/22/1986 MEDICAL RECORD NUMBER [id number]  LOCATION: [facility]  PHYSICIAN: Maree KrabbeKeith M Clance  DATE OF STUDY: 04/28/2014  SLEEP STUDY TYPE: Nocturnal Polysomnogram               REFERRING PHYSICIAN: Rollene RotundaHochrein, James, MD  INDICATION FOR STUDY: Hypersomnia with sleep apnea  EPWORTH SLEEPINESS SCORE:  19 HEIGHT: 6\' 5"  (195.6 cm)  WEIGHT: 280 lb (127.007 kg)    Body mass index is 33.2 kg/(m^2).  NECK SIZE: 19 in.  MEDICATIONS: Reviewed in the sleep record  SLEEP ARCHITECTURE: The patient had a total sleep time of 273 minutes with decreased slow-wave sleep for age, and only 17 minutes of REM. Sleep onset latency was normal at 30 minutes, and REM onset was delayed at 143 minutes. Sleep efficiency was moderately reduced at 74%   RESPIRATORY DATA: The patient was found to have 29 apneas and 21 obstructive hypopneas, giving him an AHI of 11 events per hour. His events occurred only in supine sleep, and this occurred during only one segment of the entire night over a two-hour period.  Therefore, his degree of sleep apnea may be underestimated.  OXYGEN DATA: There was transient oxygen desaturation as low as 83% with the patient's obstructive events  CARDIAC DATA: No clinically significant arrhythmias were noted  MOVEMENT/PARASOMNIA: No periodic limb movements or abnormal behaviors were seen  IMPRESSION/ RECOMMENDATION:    1) mild obstructive sleep apnea/hypoxemia syndrome, with an AHI of 11 events per hour and oxygen desaturation as low as 83%. The events occurred entirely in the supine position, which occurred only once during the night over a two-hour period.  Therefore, it is possible that his degree of sleep apnea may be underestimated. Clinical correlation is suggested. Treatment for this degree of sleep apnea can include a trial of weight loss alone, upper airway surgery, dental appliance, and also CPAP.     Barbaraann ShareKeith M  Clance Diplomate, American Board of Sleep Medicine  ELECTRONICALLY SIGNED ON:  05/05/2014, 6:14 PM [facility] PH: [phone]   FX: [phone] ACCREDITED BY THE AMERICAN ACADEMY OF SLEEP MEDICINE

## 2014-05-12 ENCOUNTER — Telehealth: Payer: Self-pay | Admitting: *Deleted

## 2014-05-12 ENCOUNTER — Ambulatory Visit: Payer: Self-pay | Admitting: Physician Assistant

## 2014-05-12 DIAGNOSIS — G4730 Sleep apnea, unspecified: Secondary | ICD-10-CM

## 2014-05-12 NOTE — Telephone Encounter (Signed)
Author: Barbaraann ShareKeith M Clance, MD Service: (none) Author Type: Physician   Filed: 05/05/2014 6:18 PM Note Time: 05/05/2014 6:13 PM Note Type: Sleep Study   Status: Signed Editor: Barbaraann ShareKeith M Clance, MD (Physician)     NAME: Spencer CurdAlexander Love  DATE OF BIRTH: 04/16/1986  MEDICAL RECORD [id number]  LOCATION: [facility]  PHYSICIAN: Maree KrabbeKeith M Clance  DATE OF STUDY: 04/28/2014  SLEEP STUDY TYPE: Nocturnal Polysomnogram  REFERRING PHYSICIAN: Rollene RotundaHochrein, James, MD  INDICATION FOR STUDY: Hypersomnia with sleep apnea  EPWORTH SLEEPINESS SCORE: 19  HEIGHT: 6\' 5"  (195.6 cm)  WEIGHT: 280 lb (127.007 kg) Body mass index is 33.2 kg/(m^2).  NECK SIZE: 19 in.  MEDICATIONS: Reviewed in the sleep record  SLEEP ARCHITECTURE: The patient had a total sleep time of 273 minutes with decreased slow-wave sleep for age, and only 17 minutes of REM. Sleep onset latency was normal at 30 minutes, and REM onset was delayed at 143 minutes. Sleep efficiency was moderately reduced at 74%  RESPIRATORY DATA: The patient was found to have 29 apneas and 21 obstructive hypopneas, giving him an AHI of 11 events per hour. His events occurred only in supine sleep, and this occurred during only one segment of the entire night over a two-hour period. Therefore, his degree of sleep apnea may be underestimated.  OXYGEN DATA: There was transient oxygen desaturation as low as 83% with the patient's obstructive events  CARDIAC DATA: No clinically significant arrhythmias were noted  MOVEMENT/PARASOMNIA: No periodic limb movements or abnormal behaviors were seen  IMPRESSION/ RECOMMENDATION:  1) mild obstructive sleep apnea/hypoxemia syndrome, with an AHI of 11 events per hour and oxygen desaturation as low as 83%. The events occurred entirely in the supine position, which occurred only once during the night over a two-hour period. Therefore, it is possible that his degree of sleep apnea may be underestimated. Clinical correlation is  suggested. Treatment for this degree of sleep apnea can include a trial of weight loss alone, upper airway surgery, dental appliance, and also CPAP.  Barbaraann ShareKeith M Clance  Diplomate, American Board of Sleep Medicine

## 2014-05-12 NOTE — Telephone Encounter (Signed)
Voice mail box not set up yet

## 2014-05-12 NOTE — Telephone Encounter (Signed)
Attempted to call pt to let him know he needs to be seen by a sleep specialist for possible treatment of sleep apnea.  Mailbox is full - will continue to attempt to contact him.

## 2014-05-15 ENCOUNTER — Encounter: Payer: Self-pay | Admitting: Internal Medicine

## 2014-05-19 ENCOUNTER — Encounter: Payer: Self-pay | Admitting: *Deleted

## 2014-05-19 NOTE — Telephone Encounter (Signed)
Unable to reach pt by phone - letter mailed to pts home address - order placed for referral.

## 2014-05-22 ENCOUNTER — Other Ambulatory Visit: Payer: Self-pay | Admitting: Physician Assistant

## 2014-05-23 ENCOUNTER — Other Ambulatory Visit: Payer: Self-pay | Admitting: Cardiology

## 2014-05-23 NOTE — Telephone Encounter (Signed)
REFILL   LORAZEPAM  TID   TO [facility]

## 2014-05-24 LAB — BRAIN NATRIURETIC PEPTIDE: Brain Natriuretic Peptide: 24.1 pg/mL (ref 0.0–100.0)

## 2014-05-25 ENCOUNTER — Ambulatory Visit (INDEPENDENT_AMBULATORY_CARE_PROVIDER_SITE_OTHER): Payer: BC Managed Care – PPO | Admitting: Emergency Medicine

## 2014-05-25 ENCOUNTER — Encounter: Payer: Self-pay | Admitting: Emergency Medicine

## 2014-05-25 VITALS — BP 148/72 | HR 82 | Temp 98.6°F | Resp 18 | Ht 76.25 in | Wt 276.0 lb

## 2014-05-25 DIAGNOSIS — F411 Generalized anxiety disorder: Secondary | ICD-10-CM

## 2014-05-25 DIAGNOSIS — E782 Mixed hyperlipidemia: Secondary | ICD-10-CM

## 2014-05-25 DIAGNOSIS — R7309 Other abnormal glucose: Secondary | ICD-10-CM

## 2014-05-25 DIAGNOSIS — I10 Essential (primary) hypertension: Secondary | ICD-10-CM

## 2014-05-25 LAB — HEMOGLOBIN A1C
HEMOGLOBIN A1C: 5.3 % (ref <5.7)
Mean Plasma Glucose: 105 mg/dL (ref <117)

## 2014-05-25 MED ORDER — LORAZEPAM 1 MG PO TABS: ORAL_TABLET | ORAL | Status: DC

## 2014-05-25 NOTE — Progress Notes (Signed)
Subjective:    Patient ID: Spencer Love, male    DOB: April 22, 1986, 28 y.o.   MRN: [id number]  HPI Comments: 28 yo WM presents for 3 month F/U for HTN, Cholesterol, Pre-Dm, D. Deficient. He notes he is taking 2 Lorazepam daily rarely TID. He notes anxiety is better controlled, decreased ETOH intake. He notes he is eating healthier and keeps active. He was started on Metformin for high insulin level. He notes BP up due to out of medication for anxiety  WBC             6.5   03/30/2014 HGB            15.2   03/30/2014 HCT            43.7   03/30/2014 PLT             194   03/30/2014 GLUCOSE          87   03/30/2014 ALT              22   03/30/2014 AST              16   03/30/2014 NA              139   03/30/2014 K               4.3   03/30/2014 CL              102   03/30/2014 CREATININE     0.82   03/30/2014 BUN              16   03/30/2014 CO2              28   03/30/2014 HGBA1C          5.3   05/25/2014 MICROALBUR     1.37   03/30/2014    Anxiety Symptoms include nervous/anxious behavior.    Hypertension Associated symptoms include anxiety.  Gastrophageal Reflux     Medication List       This list is accurate as of: 05/25/14 10:08 AM.  Always use your most recent med list.               aspirin EC 81 MG tablet  Take 81 mg by mouth 2 (two) times daily.     cloNIDine 0.1 MG tablet  Commonly known as:  CATAPRES  Take 0.05 mg by mouth 3 (three) times daily.     fish oil-omega-3 fatty acids 1000 MG capsule  Take 1 g by mouth 2 (two) times daily.     FLAX SEED OIL PO  Take 1 tablet by mouth 2 (two) times daily.     hydrochlorothiazide 25 MG tablet  Commonly known as:  HYDRODIURIL  Take 1 tablet (25 mg total) by mouth daily.     hyoscyamine 0.125 MG SL tablet  Commonly known as:  LEVSIN/SL  Place 1 tablet (0.125 mg total) under the tongue every 4 (four) hours as needed.     lisinopril 20 MG tablet  Commonly known as:  PRINIVIL,ZESTRIL  TAKE ONE TABLET BY MOUTH TWICE DAILY     LORazepam 1 MG tablet  Commonly known as:  ATIVAN  take one-half to one tablet by mouth 3 times daily as needed     metFORMIN 500 MG tablet  Commonly known as:  GLUCOPHAGE  TAKE ONE TABLET BY MOUTH TWICE DAILY     omeprazole 20 MG capsule  Commonly known as:  PRILOSEC  Take 20 mg by mouth daily.     propranolol 80 MG tablet  Commonly known as:  INDERAL  TAKE ONE TABLET BY MOUTH THREE TIMES DAILY       Allergies  Allergen Reactions  . Peanut-Containing Drug Products Anaphylaxis  . Ceclor [Cefaclor] Hives and Swelling  . Sulfa Antibiotics Hives   Past Medical History  Diagnosis Date  . Hidradenitis suppurativa   . Hypertension   . Depression   . Anxiety   . Prediabetes       Review of Systems  Psychiatric/Behavioral: The patient is nervous/anxious.   All other systems reviewed and are negative.  BP 148/72  Pulse 82  Temp(Src) 98.6 F (37 C) (Temporal)  Resp 18  Ht 6' 4.25" (1.937 m)  Wt 276 lb (125.193 kg)  BMI 33.37 kg/m2     Objective:   Physical Exam  Nursing note and vitals reviewed. Constitutional: He is oriented to person, place, and time. He appears well-developed and well-nourished.  overweight  HENT:  Head: Normocephalic and atraumatic.  Right Ear: External ear normal.  Left Ear: External ear normal.  Nose: Nose normal.  Eyes: Conjunctivae and EOM are normal.  Neck: Normal range of motion. Neck supple. No JVD present. No thyromegaly present.  Cardiovascular: Normal rate, regular rhythm, normal heart sounds and intact distal pulses.   Pulmonary/Chest: Effort normal and breath sounds normal.  Abdominal: Soft. Bowel sounds are normal. He exhibits no distension. There is no tenderness.  Musculoskeletal: Normal range of motion. He exhibits no edema and no tenderness.  Lymphadenopathy:    He has no cervical adenopathy.  Neurological: He is alert and oriented to person, place, and time. He has normal reflexes. No cranial nerve deficit. Coordination  normal.  Skin: Skin is warm and dry.  Psychiatric: He has a normal mood and affect. His behavior is normal. Judgment and thought content normal.          Assessment & Plan:  1.  3 month F/U for HTN, Cholesterol, Pre-Dm, D. Deficient. Needs healthy diet, cardio QD and obtain healthy weight. Check Labs, Check BP if >130/80 call office   2. Anxiety- Controlled currently, continue RX AD w/c if SX increase or ER, recommend counseling if symptoms continue

## 2014-05-25 NOTE — Patient Instructions (Signed)
Hypertension Hypertension is another name for high blood pressure. High blood pressure may mean that your heart needs to work harder to pump blood. Blood pressure consists of two numbers, which includes a higher number over a lower number (example: 110/72). HOME CARE   Make lifestyle changes as told by your doctor. This may include weight loss and exercise.  Take your blood pressure medicine every day.  Limit how much salt you use.  Stop smoking if you smoke.  Do not use drugs.  Talk to your doctor if you are using decongestants or birth control pills. These medicines might make blood pressure higher.  Females should not drink more than 1 alcoholic drink per day. Males should not drink more than 2 alcoholic drinks per day.  See your doctor as told. GET HELP RIGHT AWAY IF:   You have a blood pressure reading with a top number of 180 or higher.  You get a very bad headache.  You get blurred or changing vision.  You feel confused.  You feel weak, numb, or faint.  You get chest or belly (abdominal) pain.  You throw up (vomit).  You cannot breathe very well. MAKE SURE YOU:   Understand these instructions.  Will watch your condition.  Will get help right away if you are not doing well or get worse. Document Released: 06/02/2008 Document Revised: 03/08/2012 Document Reviewed: 06/02/2008 [employer] Patient Information 2014 [employer].  

## 2014-05-26 LAB — BASIC METABOLIC PANEL WITH GFR
BUN: 12 mg/dL (ref 6–23)
CHLORIDE: 101 meq/L (ref 96–112)
CO2: 28 mEq/L (ref 19–32)
CREATININE: 0.8 mg/dL (ref 0.50–1.35)
Calcium: 9.3 mg/dL (ref 8.4–10.5)
GFR, Est Non African American: >89 mL/min
Glucose, Bld: 85 mg/dL (ref 70–99)
Potassium: 3.9 mEq/L (ref 3.5–5.3)
Sodium: 139 mEq/L (ref 135–145)

## 2014-05-26 LAB — HEPATIC FUNCTION PANEL
ALT: 19 U/L (ref 0–53)
AST: 15 U/L (ref 0–37)
Albumin: 4.3 g/dL (ref 3.5–5.2)
Alkaline Phosphatase: 54 U/L (ref 39–117)
BILIRUBIN DIRECT: 0.1 mg/dL (ref 0.0–0.3)
BILIRUBIN INDIRECT: 0.4 mg/dL (ref 0.2–1.2)
Total Bilirubin: 0.5 mg/dL (ref 0.2–1.2)
Total Protein: 6.3 g/dL (ref 6.0–8.3)

## 2014-05-26 LAB — LIPID PANEL
Cholesterol: 169 mg/dL (ref 0–200)
HDL: 22 mg/dL — ABNORMAL LOW (ref >39)
LDL CALC: 94 mg/dL (ref 0–99)
TRIGLYCERIDES: 265 mg/dL — AB (ref <150)
Total CHOL/HDL Ratio: 7.7 Ratio
VLDL: 53 mg/dL — AB (ref 0–40)

## 2014-05-26 LAB — INSULIN, FASTING: Insulin fasting, serum: 152 u[IU]/mL — ABNORMAL HIGH (ref 3–28)

## 2014-06-20 ENCOUNTER — Institutional Professional Consult (permissible substitution): Payer: BC Managed Care – PPO | Admitting: Pulmonary Disease

## 2014-06-21 ENCOUNTER — Ambulatory Visit (INDEPENDENT_AMBULATORY_CARE_PROVIDER_SITE_OTHER): Payer: BC Managed Care – PPO | Admitting: Pulmonary Disease

## 2014-06-21 ENCOUNTER — Encounter: Payer: Self-pay | Admitting: Pulmonary Disease

## 2014-06-21 VITALS — BP 118/72 | HR 82 | Temp 98.4°F | Ht 77.0 in | Wt 284.4 lb

## 2014-06-21 DIAGNOSIS — G4733 Obstructive sleep apnea (adult) (pediatric): Secondary | ICD-10-CM | POA: Insufficient documentation

## 2014-06-21 NOTE — Assessment & Plan Note (Signed)
The patient has only mild obstructive sleep apnea by his recent sleep study, but he is extremely symptomatic based on his history. He also has hypertension that is somewhat difficult to control. I have had a long discussion with him about sleep apnea, including its impact to his quality of life and cardiovascular health. I have outlined various treatment options for mild sleep apnea, but I have highly recommended a trial of CPAP while he is working on weight loss because of his significant symptoms. The patient is agreeable to this approach. I will set the patient up on cpap at a moderate pressure level to allow for desensitization, and will troubleshoot the device over the next 4-6weeks if needed.  The pt is to call me if having issues with tolerance.  Will then optimize the pressure once patient is able to wear cpap on a consistent basis.

## 2014-06-21 NOTE — Progress Notes (Signed)
Subjective:    Patient ID: Spencer Love, male    DOB: 03/14/1986, 28 y.o.   MRN: [id number]  HPI The patient is a 28 year old male who I've been asked to see for management of obstructive sleep apnea. He is a recent sleep study that shows mild OSA, with an AHI of 11 events per hour. The patient has been noted to have loud snoring , but there is no consistent bed partner to comment on an abnormal breathing pattern during sleep.  The patient denies frequent awakenings during the night, but is not rested in the mornings upon arising. He tells me that he will fall asleep anytime he sits down, and this includes during the day and also in the evening while trying to watch television or movies. However, he denies sleepiness with driving. He tells me that his weight has recently increased 40 pounds, and his Epworth score today is 19. It should also be noted that he has somewhat difficult to control hypertension.  Sleep Questionnaire What time do you typically go to bed?( Between what hours) 11pm 11pm at 1002 on 06/21/14 by Darrell JewelJennifer R Castillo, CMA How long does it take you to fall asleep? 15 minutes 15 minutes at 1002 on 06/21/14 by Darrell JewelJennifer R Castillo, CMA How many times during the night do you wake up? 1 1 at 1002 on 06/21/14 by Darrell JewelJennifer R Castillo, CMA What time do you get out of bed to start your day? 0800 0800 at 1002 on 06/21/14 by Darrell JewelJennifer R Castillo, CMA Do you drive or operate heavy machinery in your occupation? No No at 1002 on 06/21/14 by Darrell JewelJennifer R Castillo, CMA How much has your weight changed (up or down) over the past two years? (In pounds) 40 lb (18.144 kg) 40 lb (18.144 kg) at 1002 on 06/21/14 by Darrell JewelJennifer R Castillo, CMA Have you ever had a sleep study before? Yes Yes at 1002 on 06/21/14 by Darrell JewelJennifer R Castillo, CMA If yes, location of study? [place] at 1002 on 06/21/14 by Darrell JewelJennifer R Castillo, CMA If yes, date of study? [id number] at 1002 on  06/21/14 by Darrell JewelJennifer R Castillo, CMA Do you currently use CPAP? No No at 1002 on 06/21/14 by Darrell JewelJennifer R Castillo, CMA Do you wear oxygen at any time? No No at 1002 on 06/21/14 by Darrell JewelJennifer R Castillo, CMA   Review of Systems  Constitutional: Negative for fever and unexpected weight change.  HENT: Negative for congestion, dental problem, ear pain, nosebleeds, postnasal drip, rhinorrhea, sinus pressure, sneezing, sore throat and trouble swallowing.   Eyes: Negative for redness and itching.  Respiratory: Positive for shortness of breath. Negative for cough, chest tightness and wheezing.   Cardiovascular: Positive for chest pain. Negative for palpitations and leg swelling.  Gastrointestinal: Positive for abdominal pain. Negative for nausea and vomiting.  Genitourinary: Negative for dysuria.  Musculoskeletal: Negative for joint swelling.  Skin: Negative for rash.  Neurological: Negative for headaches.  Hematological: Does not bruise/bleed easily.  Psychiatric/Behavioral: Negative for dysphoric mood. The patient is nervous/anxious.        Objective:   Physical Exam Constitutional:  Obese male, no acute distress  HENT:  Nares patent without discharge, but narrowed bilat.  Oropharynx without exudate, palate and uvula are thick and elongated, +sidewall narrowing  Eyes:  Perrla, eomi, no scleral icterus  Neck:  No JVD, no TMG  Cardiovascular:  Normal rate, regular rhythm, no rubs or gallops.  No murmurs        Intact  distal pulses  Pulmonary :  Normal breath sounds, no stridor or respiratory distress   No rales, rhonchi, or wheezing  Abdominal:  Soft, nondistended, bowel sounds present.  No tenderness noted.   Musculoskeletal:  No lower extremity edema noted.  Lymph Nodes:  No cervical lymphadenopathy noted  Skin:  No cyanosis noted  Neurologic:  Appears very sleepy but answers questions appropriately, moves all 4 extremities without obvious deficit.         Assessment  & Plan:

## 2014-06-21 NOTE — Patient Instructions (Signed)
Will start you on cpap at a moderate pressure.  Please call if having issues with the device. Work on weight loss followup with me again in 8 weeks.

## 2014-06-22 ENCOUNTER — Other Ambulatory Visit: Payer: Self-pay | Admitting: Emergency Medicine

## 2014-06-22 ENCOUNTER — Encounter: Payer: Self-pay | Admitting: *Deleted

## 2014-06-22 ENCOUNTER — Encounter: Payer: Self-pay | Admitting: Cardiology

## 2014-06-22 ENCOUNTER — Ambulatory Visit (INDEPENDENT_AMBULATORY_CARE_PROVIDER_SITE_OTHER): Payer: BC Managed Care – PPO | Admitting: Cardiology

## 2014-06-22 VITALS — BP 120/70 | HR 72 | Ht 77.0 in | Wt 282.0 lb

## 2014-06-22 DIAGNOSIS — R0989 Other specified symptoms and signs involving the circulatory and respiratory systems: Principal | ICD-10-CM

## 2014-06-22 DIAGNOSIS — R0609 Other forms of dyspnea: Secondary | ICD-10-CM

## 2014-06-22 NOTE — Patient Instructions (Signed)
Your physician wants you to follow-up in: 6 MONTHS WITH DR Antoine PocheHOCHREIN You will receive a reminder letter in the mail two months in advance. If you don't receive a letter, please call our office to schedule the follow-up appointment.   Your physician has requested that you have an echocardiogram. Echocardiography is a painless test that uses sound waves to create images of your heart. It provides your doctor with information about the size and shape of your heart and how well your heart's chambers and valves are working. This procedure takes approximately one hour. There are no restrictions for this procedure.

## 2014-06-22 NOTE — Progress Notes (Signed)
HPI The patient presents for follow up of dyspnea and possible sleep apnea.  Sleep study was ordered and he is to be seen by Dr. Shelle Ironlance.  He is being treated for slight sleep apnea. He continues to smoke cigarettes. He's still getting dizzy as described previously. He's not having any chest pressure, neck or arm discomfort. He's having no new palpitations, presyncope or syncope. He is concerned about the possibility of aortic stenosis given her family history.  Allergies  Allergen Reactions  . Peanut-Containing Drug Products Anaphylaxis  . Ceclor [Cefaclor] Hives and Swelling  . Sulfa Antibiotics Hives    Current Outpatient Prescriptions  Medication Sig Dispense Refill  . aspirin EC 81 MG tablet Take 81 mg by mouth 2 (two) times daily.      . cloNIDine (CATAPRES) 0.1 MG tablet Take 0.05 mg by mouth 3 (three) times daily.      . fish oil-omega-3 fatty acids 1000 MG capsule Take 1 g by mouth 2 (two) times daily.      . Flaxseed, Linseed, (FLAX SEED OIL PO) Take 1 tablet by mouth 2 (two) times daily.      . hydrochlorothiazide (HYDRODIURIL) 25 MG tablet Take 1 tablet (25 mg total) by mouth daily.  90 tablet  1  . lisinopril (PRINIVIL,ZESTRIL) 20 MG tablet TAKE ONE TABLET BY MOUTH TWICE DAILY   180 tablet  1  . LORazepam (ATIVAN) 1 MG tablet take one-half to one tablet by mouth 3 times daily as needed  90 tablet  0  . metFORMIN (GLUCOPHAGE) 500 MG tablet TAKE ONE TABLET BY MOUTH TWICE DAILY  180 tablet  1  . omeprazole (PRILOSEC) 20 MG capsule Take 20 mg by mouth daily.      . propranolol (INDERAL) 80 MG tablet TAKE ONE TABLET BY MOUTH THREE TIMES DAILY   270 tablet  1   No current facility-administered medications for this visit.    Past Medical History  Diagnosis Date  . Hidradenitis suppurativa   . Hypertension   . Depression   . Anxiety   . Prediabetes     Past Surgical History  Procedure Laterality Date  . None      ROS:  As stated in the HPI and negative for all other  systems.  PHYSICAL EXAM BP 120/70  Pulse 72  Ht 6\' 5"  (1.956 m)  Wt 282 lb (127.914 kg)  BMI 33.43 kg/m2 GENERAL:  Well appearing NECK:  No jugular venous distention, waveform within normal limits, carotid upstroke brisk and symmetric, no bruits, no thyromegaly LUNGS:  Clear to auscultation bilaterally CHEST:  Unremarkable HEART:  PMI not displaced or sustained,S1 and S2 within normal limits, no S3, no S4, no clicks, no rubs, no murmurs ABD:  Flat, positive bowel sounds normal in frequency in pitch, no bruits, no rebound, no guarding, no midline pulsatile mass, no hepatomegaly, no splenomegaly EXT:  2 plus pulses throughout, no edema, no cyanosis no clubbing  EKG:  Sinus rhythm, rate 56, axis within normal limits, intervals within normal limits, no acute ST-T wave changes.  06/22/2014   ASSESSMENT AND PLAN  DYSPNEA:   This is probably related to smoking.  I will check an echo as he has a strong family history of bicuspid AoV.  If this is normal but breathing gets worse we will consider a POET (Plain Old Exercise Treadmill)  HTN:  His BP is well controlled.  He will continue the meds as listed.  I will work up sleep apnea  SLEEP APNEA:   Per Dr. Shelle Ironlance  TOBACCO: He understands the need to stop smoking.

## 2014-07-05 ENCOUNTER — Inpatient Hospital Stay (HOSPITAL_COMMUNITY): Admission: RE | Admit: 2014-07-05 | Payer: BC Managed Care – PPO | Source: Ambulatory Visit

## 2014-07-26 ENCOUNTER — Other Ambulatory Visit: Payer: Self-pay | Admitting: Physician Assistant

## 2014-08-14 ENCOUNTER — Telehealth: Payer: Self-pay | Admitting: Pulmonary Disease

## 2014-08-14 NOTE — Telephone Encounter (Signed)
ATC PT but went straight to VM that is not set up yet wcb

## 2014-08-14 NOTE — Telephone Encounter (Signed)
[phone] returning a call

## 2014-08-14 NOTE — Telephone Encounter (Signed)
ATC pt. Heard talking in background, when I said hello, no one said anything. wcb

## 2014-08-14 NOTE — Telephone Encounter (Signed)
ATC PT NA, VM not set up. wcb

## 2014-08-15 NOTE — Telephone Encounter (Signed)
Darl PikesSusan called back and she stated that the pt will come in the office in the morning to be set up with cpap.  appt has been rescheduled for 10-16 with [facility].  [facility]  Nothing further is needed.

## 2014-08-15 NOTE — Telephone Encounter (Signed)
Called and spoke with pt and he stated that he was to be set up with CPAP with [facility] and he stated that he has not received a call back from them and has not started on his CPAP.  Pt wanted to cancel [facility] for [facility] and reschedule after he has started on his CPAP.    Called and spoke with susan from [facility] and she stated that they received the order for the CPAP on 6/29 and they did call the pt to set up cpap and he stated to call back after August 1.  [facility] called the pt back on 8-6 and lmomtcb and the pt has not returned their call.   Darl PikesSusan will call the pt now to see if she can set this up.

## 2014-08-16 ENCOUNTER — Ambulatory Visit: Payer: BC Managed Care – PPO | Admitting: Pulmonary Disease

## 2014-08-23 ENCOUNTER — Other Ambulatory Visit: Payer: Self-pay | Admitting: Internal Medicine

## 2014-09-05 ENCOUNTER — Other Ambulatory Visit (HOSPITAL_COMMUNITY): Payer: Self-pay | Admitting: Cardiology

## 2014-09-05 DIAGNOSIS — R0989 Other specified symptoms and signs involving the circulatory and respiratory systems: Principal | ICD-10-CM

## 2014-09-05 DIAGNOSIS — R0609 Other forms of dyspnea: Secondary | ICD-10-CM

## 2014-09-06 ENCOUNTER — Ambulatory Visit (HOSPITAL_COMMUNITY)
Admission: RE | Admit: 2014-09-06 | Discharge: 2014-09-06 | Disposition: A | Discharge status: Home / Self Care | Payer: BC Managed Care – PPO | Source: Ambulatory Visit | Attending: Cardiology | Admitting: Cardiology

## 2014-09-06 DIAGNOSIS — I080 Rheumatic disorders of both mitral and aortic valves: Secondary | ICD-10-CM | POA: Diagnosis not present

## 2014-09-06 DIAGNOSIS — I079 Rheumatic tricuspid valve disease, unspecified: Secondary | ICD-10-CM | POA: Diagnosis not present

## 2014-09-06 DIAGNOSIS — R0609 Other forms of dyspnea: Secondary | ICD-10-CM | POA: Insufficient documentation

## 2014-09-06 DIAGNOSIS — I10 Essential (primary) hypertension: Secondary | ICD-10-CM | POA: Diagnosis not present

## 2014-09-06 DIAGNOSIS — G4730 Sleep apnea, unspecified: Secondary | ICD-10-CM | POA: Insufficient documentation

## 2014-09-06 DIAGNOSIS — R0602 Shortness of breath: Secondary | ICD-10-CM

## 2014-09-06 DIAGNOSIS — R0989 Other specified symptoms and signs involving the circulatory and respiratory systems: Secondary | ICD-10-CM | POA: Diagnosis present

## 2014-09-06 DIAGNOSIS — R42 Dizziness and giddiness: Secondary | ICD-10-CM | POA: Diagnosis not present

## 2014-09-06 DIAGNOSIS — I359 Nonrheumatic aortic valve disorder, unspecified: Secondary | ICD-10-CM

## 2014-09-06 NOTE — Progress Notes (Signed)
2D Echo Performed 09/06/2014    Roddy Bellamy, RCS  

## 2014-09-11 ENCOUNTER — Telehealth: Payer: Self-pay | Admitting: Cardiology

## 2014-09-11 NOTE — Telephone Encounter (Signed)
Spoke with pt, aware of echo results. 

## 2014-09-11 NOTE — Telephone Encounter (Signed)
Pt called in stating that he had an Echo done on 9/7 and has not received a call in regards to the results. Please call  thanks

## 2014-09-17 ENCOUNTER — Other Ambulatory Visit: Payer: Self-pay | Admitting: Internal Medicine

## 2014-09-27 ENCOUNTER — Ambulatory Visit: Payer: Self-pay | Admitting: Emergency Medicine

## 2014-09-28 ENCOUNTER — Ambulatory Visit (INDEPENDENT_AMBULATORY_CARE_PROVIDER_SITE_OTHER): Payer: BC Managed Care – PPO | Admitting: Internal Medicine

## 2014-09-28 ENCOUNTER — Encounter: Payer: Self-pay | Admitting: Internal Medicine

## 2014-09-28 VITALS — BP 124/70 | HR 84 | Temp 97.8°F | Resp 16 | Ht 76.0 in | Wt 297.8 lb

## 2014-09-28 DIAGNOSIS — R7309 Other abnormal glucose: Secondary | ICD-10-CM

## 2014-09-28 DIAGNOSIS — Z79899 Other long term (current) drug therapy: Secondary | ICD-10-CM

## 2014-09-28 DIAGNOSIS — E782 Mixed hyperlipidemia: Secondary | ICD-10-CM

## 2014-09-28 DIAGNOSIS — A630 Anogenital (venereal) warts: Secondary | ICD-10-CM

## 2014-09-28 DIAGNOSIS — I10 Essential (primary) hypertension: Secondary | ICD-10-CM

## 2014-09-28 DIAGNOSIS — E559 Vitamin D deficiency, unspecified: Secondary | ICD-10-CM

## 2014-09-28 DIAGNOSIS — R7303 Prediabetes: Secondary | ICD-10-CM

## 2014-09-28 DIAGNOSIS — Z5181 Encounter for therapeutic drug level monitoring: Secondary | ICD-10-CM

## 2014-09-28 LAB — CBC WITH DIFFERENTIAL/PLATELET
Basophils Absolute: 0 10*3/uL (ref 0.0–0.1)
Basophils Relative: 0 % (ref 0–1)
Eosinophils Absolute: 0.2 10*3/uL (ref 0.0–0.7)
Eosinophils Relative: 3 % (ref 0–5)
HEMATOCRIT: 39.3 % (ref 39.0–52.0)
HEMOGLOBIN: 14.2 g/dL (ref 13.0–17.0)
LYMPHS PCT: 33 % (ref 12–46)
Lymphs Abs: 2.1 10*3/uL (ref 0.7–4.0)
MCH: 30.2 pg (ref 26.0–34.0)
MCHC: 36.1 g/dL — ABNORMAL HIGH (ref 30.0–36.0)
MCV: 83.6 fL (ref 78.0–100.0)
MONO ABS: 0.3 10*3/uL (ref 0.1–1.0)
Monocytes Relative: 4 % (ref 3–12)
NEUTROS PCT: 60 % (ref 43–77)
Neutro Abs: 3.8 10*3/uL (ref 1.7–7.7)
Platelets: 176 10*3/uL (ref 150–400)
RBC: 4.7 MIL/uL (ref 4.22–5.81)
RDW: 13.4 % (ref 11.5–15.5)
WBC: 6.3 10*3/uL (ref 4.0–10.5)

## 2014-09-28 LAB — HEMOGLOBIN A1C
Hgb A1c MFr Bld: 5.2 % (ref <5.7)
MEAN PLASMA GLUCOSE: 103 mg/dL (ref <117)

## 2014-09-28 MED ORDER — PODOPHYLLUM RESIN 25 % EX SOLN: CUTANEOUS | Status: DC

## 2014-09-28 NOTE — Progress Notes (Signed)
Patient ID: Spencer Love, male   DOB: 07/08/1986, 28 y.o.   MRN: [id number]   This very nice 27 y.o.male presents for 3 month follow up with Hypertension, Hyperlipidemia, Pre-Diabetes and Vitamin D Deficiency.    Patient is treated for HTN & BP has been controlled at home. Today's BP: 124/70 mmHg. Patient has had no complaints of any cardiac type chest pain, palpitations, dyspnea/orthopnea/PND, dizziness, claudication, or dependent edema.   Hyperlipidemia is controlled with diet & meds. Patient denies myalgias or other med SE's. Last Lipids were  Total Chol 169; HDL 22*; LDL  94; Trig 265 on 05/25/2014.   Also, the patient has history of  PreDiabetes/Insulin Resistance and has had no symptoms of reactive hypoglycemia, diabetic polys, paresthesias or visual blurring.  Last A1c was  5.3% on 05/25/2014 and elevated insulin 152 and patient was started on insulin.    Further, the patient also has history of Vitamin D Deficiency and supplements vitamin D without any suspected side-effects.    Medication List   aspirin EC 81 MG tablet  Take 81 mg by mouth 2 (two) times daily.     cloNIDine 0.2 MG tablet  Commonly known as:  CATAPRES  TAKE ONE HALF TO ONE TABLET BY MOUTH THREE TIMES DAILY for blood pressure.     cloNIDine 0.1 MG tablet  Commonly known as:  CATAPRES  Take 0.05 mg by mouth 3 (three) times daily.     fish oil-omega-3 fatty acids 1000 MG capsule  Take 1 g by mouth 2 (two) times daily.     FLAX SEED OIL PO  Take 1 tablet by mouth 2 (two) times daily.     hydrochlorothiazide 25 MG tablet  Commonly known as:  HYDRODIURIL  TAKE ONE TABLET BY MOUTH ONE TIME DAILY     lisinopril 20 MG tablet  Commonly known as:  PRINIVIL,ZESTRIL  TAKE ONE TABLET BY MOUTH TWICE DAILY     LORazepam 1 MG tablet  Commonly known as:  ATIVAN  TAKE ONE HALF TO ONE TABLET BY MOUTH THREE TIMES DAILY AS NEEDED     metFORMIN 500 MG tablet  Commonly known as:  GLUCOPHAGE  TAKE ONE TABLET BY MOUTH  TWICE DAILY     omeprazole 20 MG capsule  Commonly known as:  PRILOSEC  Take 20 mg by mouth daily.     propranolol 80 MG tablet  Commonly known as:  INDERAL  TAKE ONE TABLET BY MOUTH THREE TIMES DAILY     Allergies  Allergen Reactions  . Peanut-Containing Drug Products Anaphylaxis  . Ceclor [Cefaclor] Hives and Swelling  . Sulfa Antibiotics Hives   PMHx:   Past Medical History  Diagnosis Date  . Hidradenitis suppurativa   . Hypertension   . Depression   . Anxiety   . Prediabetes    FHx:    Reviewed / unchanged  SHx:    Reviewed / unchanged   Systems Review:  Constitutional: Denies fever, chills, wt changes, headaches, insomnia, fatigue, night sweats, change in appetite. Eyes: Denies redness, blurred vision, diplopia, discharge, itchy, watery eyes.  ENT: Denies discharge, congestion, post nasal drip, epistaxis, sore throat, earache, hearing loss, dental pain, tinnitus, vertigo, sinus pain, snoring.  CV: Denies chest pain, palpitations, irregular heartbeat, syncope, dyspnea, diaphoresis, orthopnea, PND, claudication or edema. Respiratory: denies cough, dyspnea, DOE, pleurisy, hoarseness, laryngitis, wheezing.  Gastrointestinal: Denies dysphagia, odynophagia, heartburn, reflux, water brash, abdominal pain or cramps, nausea, vomiting, bloating, diarrhea, constipation, hematemesis, melena, hematochezia  or hemorrhoids. Genitourinary: Denies  dysuria, frequency, urgency, nocturia, hesitancy, discharge, hematuria or flank pain. Musculoskeletal: Denies arthralgias, myalgias, stiffness, jt. swelling, pain, limping or strain/sprain.  Skin: Denies pruritus, rash, hives, warts, acne, eczema or change in skin lesion(s). Neuro: No weakness, tremor, incoordination, spasms, paresthesia or pain. Psychiatric: Denies confusion, memory loss or sensory loss. Endo: Denies change in weight, skin or hair change.  Heme/Lymph: No excessive bleeding, bruising or enlarged lymph nodes.  Exam:  BP  124/70  Pulse 84  Temp97.8 F   Resp 16  Ht 6\' 4"    Wt 297 lb 12.8 oz   BMI 36.26   Appears Over nourished and in no distress. Eyes: PERRLA, EOMs, conjunctiva no swelling or erythema. Sinuses: No frontal/maxillary tenderness ENT/Mouth: EAC's clear, TM's nl w/o erythema, bulging. Nares clear w/o erythema, swelling, exudates. Oropharynx clear without erythema or exudates. Oral hygiene is good. Tongue normal, non obstructing. Hearing intact.  Neck: Supple. Thyroid nl. Car 2+/2+ without bruits, nodes or JVD. Chest: Respirations nl with BS clear & equal w/o rales, rhonchi, wheezing or stridor.  Cor: Heart sounds normal w/ regular rate and rhythm without sig. murmurs, gallops, clicks, or rubs. Peripheral pulses normal and equal  without edema.  Abdomen: Soft & bowel sounds normal. Non-tender w/o guarding, rebound, hernias, masses, or organomegaly.  Lymphatics: Unremarkable.  Musculoskeletal: Full ROM all peripheral extremities, joint stability, 5/5 strength, and normal gait.  Skin: Warm, dry without exposed rashes, lesions or ecchymosis apparent. Patient is noted to have some flat genital warts in the right groin & suprapubic area. Neuro: Cranial nerves intact, reflexes equal bilaterally. Sensory-motor testing grossly intact. Tendon reflexes grossly intact.  Pysch: Alert & oriented x 3.  Insight and judgement nl & appropriate. No ideations.  Assessment and Plan:  1. Hypertension - Continue monitor blood pressure at home. Continue diet/meds same.  2. Hyperlipidemia - Continue diet/meds, exercise,& lifestyle modifications. Continue monitor periodic cholesterol/liver & renal functions   3. Pre-Diabetes/Insulin Resistance - Continue diet, exercise, lifestyle modifications. Monitor appropriate labs.  4. Vitamin D Deficiency - Continue supplementation.  5. Genital Warts - Rx Podophilum & instructed in use    Recommended regular exercise, BP monitoring, weight control, and discussed med and  SE's. Recommended labs to assess and monitor clinical status. Further disposition pending results of labs.

## 2014-09-28 NOTE — Patient Instructions (Signed)

## 2014-09-29 LAB — LIPID PANEL
CHOL/HDL RATIO: 6.5 ratio
CHOLESTEROL: 155 mg/dL (ref 0–200)
HDL: 24 mg/dL — AB (ref >39)
LDL CALC: 73 mg/dL (ref 0–99)
Triglycerides: 289 mg/dL — ABNORMAL HIGH (ref <150)
VLDL: 58 mg/dL — AB (ref 0–40)

## 2014-09-29 LAB — HEPATIC FUNCTION PANEL
ALK PHOS: 42 U/L (ref 39–117)
ALT: 29 U/L (ref 0–53)
AST: 20 U/L (ref 0–37)
Albumin: 4 g/dL (ref 3.5–5.2)
BILIRUBIN DIRECT: 0.1 mg/dL (ref 0.0–0.3)
BILIRUBIN TOTAL: 0.4 mg/dL (ref 0.2–1.2)
Indirect Bilirubin: 0.3 mg/dL (ref 0.2–1.2)
TOTAL PROTEIN: 5.9 g/dL — AB (ref 6.0–8.3)

## 2014-09-29 LAB — TSH: TSH: 0.601 u[IU]/mL (ref 0.350–4.500)

## 2014-09-29 LAB — BASIC METABOLIC PANEL WITH GFR
BUN: 18 mg/dL (ref 6–23)
CHLORIDE: 105 meq/L (ref 96–112)
CO2: 25 mEq/L (ref 19–32)
Calcium: 8.7 mg/dL (ref 8.4–10.5)
Creat: 0.8 mg/dL (ref 0.50–1.35)
Glucose, Bld: 124 mg/dL — ABNORMAL HIGH (ref 70–99)
POTASSIUM: 4 meq/L (ref 3.5–5.3)
SODIUM: 140 meq/L (ref 135–145)

## 2014-09-29 LAB — INSULIN, FASTING: Insulin fasting, serum: 115.3 u[IU]/mL — ABNORMAL HIGH (ref 2.0–19.6)

## 2014-09-29 LAB — VITAMIN D 25 HYDROXY (VIT D DEFICIENCY, FRACTURES): Vit D, 25-Hydroxy: 23 ng/mL — ABNORMAL LOW (ref 30–89)

## 2014-09-29 LAB — MAGNESIUM: Magnesium: 1.9 mg/dL (ref 1.5–2.5)

## 2014-10-06 ENCOUNTER — Other Ambulatory Visit: Payer: Self-pay | Admitting: Internal Medicine

## 2014-10-06 ENCOUNTER — Other Ambulatory Visit: Payer: Self-pay | Admitting: Physician Assistant

## 2014-10-11 ENCOUNTER — Other Ambulatory Visit: Payer: Self-pay | Admitting: Physician Assistant

## 2014-10-11 ENCOUNTER — Other Ambulatory Visit: Payer: Self-pay | Admitting: Internal Medicine

## 2014-10-12 ENCOUNTER — Encounter: Payer: Self-pay | Admitting: Cardiology

## 2014-10-12 MED ORDER — CLONIDINE HCL 0.2 MG PO TABS: ORAL_TABLET | ORAL | Status: DC

## 2014-10-12 MED ORDER — HYDROCHLOROTHIAZIDE 25 MG PO TABS: ORAL_TABLET | ORAL | Status: DC

## 2014-10-13 ENCOUNTER — Encounter: Payer: Self-pay | Admitting: Pulmonary Disease

## 2014-10-13 ENCOUNTER — Ambulatory Visit (INDEPENDENT_AMBULATORY_CARE_PROVIDER_SITE_OTHER): Payer: BC Managed Care – PPO | Admitting: Pulmonary Disease

## 2014-10-13 VITALS — BP 120/72 | HR 72 | Temp 97.0°F | Ht 76.0 in | Wt 294.6 lb

## 2014-10-13 DIAGNOSIS — Z23 Encounter for immunization: Secondary | ICD-10-CM

## 2014-10-13 DIAGNOSIS — G4733 Obstructive sleep apnea (adult) (pediatric): Secondary | ICD-10-CM

## 2014-10-13 NOTE — Assessment & Plan Note (Signed)
The patient is doing fairly well with CPAP, and has definitely seen an improvement in his sleep and daytime alertness. His download shows excellent compliance and adequate control of his AHI. He does have a few breakthrough events, and I would like to increase his pressure ranged on the automatic setting. I've also encouraged him to work aggressively on weight loss.

## 2014-10-13 NOTE — Patient Instructions (Signed)
Continue on cpap, and keep up with mask changes and supplies. Work on weight loss Will increase your pressure on cpap to a range of 5-15 Will give you a flu shot today. followup with me in 6mos

## 2014-10-13 NOTE — Progress Notes (Signed)
   Subjective:    Patient ID: Spencer Love, male    DOB: 05/26/1986, 28 y.o.   MRN: [id number]  HPI The patient comes in today for followup of his obstructive sleep apnea.  He is wearing CPAP compliantly, and is having no issues with his mask fit or pressure. His download shows excellent compliance, and only a little bit of breakthrough on his current pressure setting. He feels that he is definitely sleeping better since being on CPAP, with improved daytime alertness.   Review of Systems  Constitutional: Negative for fever and unexpected weight change.  HENT: Negative for congestion, dental problem, ear pain, nosebleeds, postnasal drip, rhinorrhea, sinus pressure, sneezing, sore throat and trouble swallowing.   Eyes: Negative for redness and itching.  Respiratory: Negative for cough, chest tightness, shortness of breath and wheezing.   Cardiovascular: Negative for palpitations and leg swelling.  Gastrointestinal: Negative for nausea and vomiting.  Genitourinary: Negative for dysuria.  Musculoskeletal: Negative for joint swelling.  Skin: Negative for rash.  Neurological: Negative for headaches.  Hematological: Does not bruise/bleed easily.  Psychiatric/Behavioral: Negative for dysphoric mood. The patient is not nervous/anxious.        Objective:   Physical Exam Overweight male in no acute distress Nose without purulence or discharge noted No skin breakdown or pressure necrosis from the CPAP Neck without lymphadenopathy or thyromegaly Lower extremities without edema, no cyanosis Alert and oriented, moves all 4 extremities.       Assessment & Plan:

## 2014-10-23 ENCOUNTER — Other Ambulatory Visit: Payer: Self-pay | Admitting: Internal Medicine

## 2014-10-30 ENCOUNTER — Other Ambulatory Visit: Payer: Self-pay | Admitting: Internal Medicine

## 2014-10-31 MED ORDER — CLONIDINE HCL 0.2 MG PO TABS: ORAL_TABLET | ORAL | Status: DC

## 2014-10-31 MED ORDER — LISINOPRIL 20 MG PO TABS: 20.0000 mg | ORAL_TABLET | Freq: Two times a day (BID) | ORAL | Status: DC

## 2014-10-31 MED ORDER — HYDROCHLOROTHIAZIDE 25 MG PO TABS: ORAL_TABLET | ORAL | Status: DC

## 2014-11-01 ENCOUNTER — Ambulatory Visit (INDEPENDENT_AMBULATORY_CARE_PROVIDER_SITE_OTHER): Payer: BC Managed Care – PPO | Admitting: Physician Assistant

## 2014-11-01 ENCOUNTER — Encounter: Payer: Self-pay | Admitting: Physician Assistant

## 2014-11-01 VITALS — BP 136/64 | HR 84 | Temp 98.0°F | Resp 18 | Ht 76.0 in | Wt 293.0 lb

## 2014-11-01 DIAGNOSIS — F17200 Nicotine dependence, unspecified, uncomplicated: Secondary | ICD-10-CM

## 2014-11-01 DIAGNOSIS — F419 Anxiety disorder, unspecified: Secondary | ICD-10-CM

## 2014-11-01 DIAGNOSIS — Z5181 Encounter for therapeutic drug level monitoring: Secondary | ICD-10-CM

## 2014-11-01 DIAGNOSIS — Z720 Tobacco use: Secondary | ICD-10-CM

## 2014-11-01 DIAGNOSIS — F32A Depression, unspecified: Secondary | ICD-10-CM

## 2014-11-01 DIAGNOSIS — G4733 Obstructive sleep apnea (adult) (pediatric): Secondary | ICD-10-CM

## 2014-11-01 DIAGNOSIS — I10 Essential (primary) hypertension: Secondary | ICD-10-CM

## 2014-11-01 DIAGNOSIS — F329 Major depressive disorder, single episode, unspecified: Secondary | ICD-10-CM

## 2014-11-01 DIAGNOSIS — H6691 Otitis media, unspecified, right ear: Secondary | ICD-10-CM

## 2014-11-01 MED ORDER — CITALOPRAM HYDROBROMIDE 40 MG PO TABS: ORAL_TABLET | ORAL | Status: DC

## 2014-11-01 NOTE — Patient Instructions (Signed)
-Take Celexa 34m- ONLY take 1/2 tablet by mouth daily. -Make sure you are eating healthy and exercising 120 minutes per week. -Keep follow up appointment with Dr. HPercival Spanishin January 2016. -Follow up in one month for recheck on Celexa.  Generalized Anxiety Disorder Generalized anxiety disorder (GAD) is a mental disorder. It interferes with life functions, including relationships, work, and school. GAD is different from normal anxiety, which everyone experiences at some point in their lives in response to specific life events and activities. Normal anxiety actually helps [place] for and get through these life events and activities. Normal anxiety goes away after the event or activity is over.  GAD causes anxiety that is not necessarily related to specific events or activities. It also causes excess anxiety in proportion to specific events or activities. The anxiety associated with GAD is also difficult to control. GAD can vary from mild to severe. People with severe GAD can have intense waves of anxiety with physical symptoms (panic attacks).  SYMPTOMS The anxiety and worry associated with GAD are difficult to control. This anxiety and worry are related to many life events and activities and also occur more days than not for 6 months or longer. People with GAD also have three or more of the following symptoms (one or more in children):  Restlessness.   Fatigue.  Difficulty concentrating.   Irritability.  Muscle tension.  Difficulty sleeping or unsatisfying sleep. DIAGNOSIS GAD is diagnosed through an assessment by your health care provider. Your health care provider will ask you questions aboutyour mood,physical symptoms, and events in your life. Your health care provider may ask you about your medical history and use of alcohol or drugs, including prescription medicines. Your health care provider may also do a physical exam and blood tests. Certain medical conditions and the use of  certain substances can cause symptoms similar to those associated with GAD. Your health care provider may refer you to a mental health specialist for further evaluation. TREATMENT The following therapies are usually used to treat GAD:   Medication. Antidepressant medication usually is prescribed for long-term daily control. Antianxiety medicines may be added in severe cases, especially when panic attacks occur.   Talk therapy (psychotherapy). Certain types of talk therapy can be helpful in treating GAD by providing support, education, and guidance. A form of talk therapy called cognitive behavioral therapy can teach you healthy ways to think about and react to daily life events and activities.  Stress managementtechniques. These include yoga, meditation, and exercise and can be very helpful when they are practiced regularly. A mental health specialist can help determine which treatment is best for you. Some people see improvement with one therapy. However, other people require a combination of therapies. Document Released: 04/11/2013 Document Revised: 05/01/2014 Document Reviewed: 04/11/2013 [facility]Patient Information 2015 [place] This information is not intended to replace advice given to you by your health care provider. Make sure you discuss any questions you have with your health care provider.  Stress and Stress Management Stress is a normal reaction to life events. It is what you feel when life demands more than you are used to or more than you can handle. Some stress can be useful. For example, the stress reaction can help you catch the last bus of the day, study for a test, or meet a deadline at work. But stress that occurs too often or for too long can cause problems. It can affect your emotional health and interfere with relationships and normal  daily activities. Too much stress can weaken your immune system and increase your risk for physical illness. If you already have a  medical problem, stress can make it worse. CAUSES  All sorts of life events may cause stress. An event that causes stress for one person may not be stressful for another person. Major life events commonly cause stress. These may be positive or negative. Examples include losing your job, moving into a new home, getting married, having a baby, or losing a loved one. Less obvious life events may also cause stress, especially if they occur day after day or in combination. Examples include working long hours, driving in traffic, caring for children, being in debt, or being in a difficult relationship. SIGNS AND SYMPTOMS Stress may cause emotional symptoms including, the following:  Anxiety. This is feeling worried, afraid, on edge, overwhelmed, or out of control.  Anger. This is feeling irritated or impatient.  Depression. This is feeling sad, down, helpless, or guilty.  Difficulty focusing, remembering, or making decisions. Stress may cause physical symptoms, including the following:   Aches and pains. These may affect your head, neck, back, stomach, or other areas of your body.  Tight muscles or clenched jaw.  Low energy or trouble sleeping. Stress may cause unhealthy behaviors, including the following:   Eating to feel better (overeating) or skipping meals.  Sleeping too little, too much, or both.  Working too much or putting off tasks (procrastination).  Smoking, drinking alcohol, or using drugs to feel better. DIAGNOSIS  Stress is diagnosed through an assessment by your health care provider. Your health care provider will ask questions about your symptoms and any stressful life events.Your health care provider will also ask about your medical history and may order blood tests or other tests. Certain medical conditions and medicine can cause physical symptoms similar to stress. Mental illness can cause emotional symptoms and unhealthy behaviors similar to stress. Your health care  provider may refer you to a mental health professional for further evaluation.  TREATMENT  Stress management is the recommended treatment for stress.The goals of stress management are reducing stressful life events and coping with stress in healthy ways.  Techniques for reducing stressful life events include the following:  Stress identification. Self-monitor for stress and identify what causes stress for you. These skills may help you to avoid some stressful events.  Time management. Set your priorities, keep a calendar of events, and learn to say "no." These tools can help you avoid making too many commitments. Techniques for coping with stress include the following:  Rethinking the problem. Try to think realistically about stressful events rather than ignoring them or overreacting. Try to find the positives in a stressful situation rather than focusing on the negatives.  Exercise. Physical exercise can release both physical and emotional tension. The key is to find a form of exercise you enjoy and do it regularly.  Relaxation techniques. These relax the body and mind. Examples include yoga, meditation, tai chi, biofeedback, deep breathing, progressive muscle relaxation, listening to music, being out in nature, journaling, and other hobbies. Again, the key is to find one or more that you enjoy and can do regularly.  Healthy lifestyle. Eat a balanced diet, get plenty of sleep, and do not smoke. Avoid using alcohol or drugs to relax.  Strong support network. Spend time with family, friends, or other people you enjoy being around.Express your feelings and talk things over with someone you trust. Counseling or talktherapy with a mental health  professional may be helpful if you are having difficulty managing stress on your own. Medicine is typically not recommended for the treatment of stress.Talk to your health care provider if you think you need medicine for symptoms of stress. HOME CARE  INSTRUCTIONS  Keep all follow-up visits as directed by your health care provider.  Take all medicines as directed by your health care provider. SEEK MEDICAL CARE IF:  Your symptoms get worse or you start having new symptoms.  You feel overwhelmed by your problems and can no longer manage them on your own. SEEK IMMEDIATE MEDICAL CARE IF:  You feel like hurting yourself or someone else. Document Released: 06/10/2001 Document Revised: 05/01/2014 Document Reviewed: 08/09/2013 [facility] Patient Information 2015 [place], [place]. This information is not intended to replace advice given to you by your health care provider. Make sure you discuss any questions you have with your health care provider.

## 2014-11-01 NOTE — Progress Notes (Signed)
Subjective:    Patient ID: Spencer Love, male    DOB: 04/12/1986, 28 y.o.   MRN: [id number]  Chief Complaint  Patient presents with  . Acute Visit    Patient presents with c/o elevated PR intermittent times 1 month.  Requesting medication review.  Notes BP running fine, no increase in anxiety, just notes increased PR.   HPI A 28yo Caucasian male presents to the office due to having an "uncomfortable feeling" for about a month and states his HR has been increased.  He states his HR has been in the 100s.  He thinks it could be from the Clonidine or Propranolol.  He has been on the Propranolol 80mg  since 04/06/14 and Clonidine 0.2mg  for months.  Patient is also on Lisinopril 20mg .  He also reports "feeling heart beating" like palpitations.  He states there is no particular time they occur.  Does not notice any aggravating or alleviating factors.  He reports that it "feels hard to breath" and has dizzy spells with lightheadedness and no tinnitus.  He reports anxiety and denied stress and depression at first, but later told me that he has stress due to taking care of his sick aunt.  Patient does have sleep apnea and states he is wearing his CPAP machine and feels OK with that.  Patient denies any pain right now.  Patient denies headaches, weight change, chest pain, SOB, chest tightness, wheezing, cough, abdominal pain, nausea, vomiting, diarrhea and constipation.  Tobacco Use- cigerettes- smokes less than pack a day.  Has been smoking for 10 years.   Was on Celexa 40mg  (1/2 tablet QDaily) previously.   Saw Cardiologist on 06/22/14 and has follow up in January 2016 (still needs to schedule appt).  States cardiologist never gave him results of ECHO. Saw Pulmonologist on 06/21/14 for CPAP  Illegal Drugs- Denies current use, but states he did drugs when younger.  Would not state which ones.  Review of Systems  Constitutional: Negative.  Negative for fever, chills, diaphoresis, appetite change, fatigue and  unexpected weight change.  HENT: Negative.  Negative for congestion, dental problem, ear discharge, ear pain, postnasal drip, rhinorrhea, sinus pressure, sore throat, tinnitus, trouble swallowing and voice change.   Eyes: Negative.   Respiratory: Negative.  Negative for cough, chest tightness, shortness of breath, wheezing and stridor.   Cardiovascular: Positive for palpitations. Negative for chest pain and leg swelling.  Gastrointestinal: Negative.  Negative for nausea, vomiting, abdominal pain, diarrhea and constipation.  Genitourinary: Negative.   Musculoskeletal: Negative.   Skin: Negative.   Neurological: Positive for dizziness and light-headedness.  Psychiatric/Behavioral: The patient is nervous/anxious.        Denies stress or depression at first, but does state he is currently taking care of his sick aunt and that causes stress for him.   Past Medical History  Diagnosis Date  . Hidradenitis suppurativa   . Hypertension   . Depression   . Anxiety   . Prediabetes    Current Outpatient Prescriptions on File Prior to Visit  Medication Sig Dispense Refill  . aspirin EC 81 MG tablet Take 81 mg by mouth 2 (two) times daily.    . cloNIDine (CATAPRES) 0.2 MG tablet TAKE ONE HALF TO ONE TABLET BY MOUTH THREE TIMES DAILY for blood pressure. 90 tablet 0  . fish oil-omega-3 fatty acids 1000 MG capsule Take 1 g by mouth 2 (two) times daily.    . Flaxseed, Linseed, (FLAX SEED OIL PO) Take 1 tablet by mouth 2 (two)  times daily.    . hydrochlorothiazide (HYDRODIURIL) 25 MG tablet TAKE ONE TABLET BY MOUTH ONE TIME DAILY 90 tablet 0  . lisinopril (PRINIVIL,ZESTRIL) 20 MG tablet Take 1 tablet (20 mg total) by mouth 2 (two) times daily. 180 tablet 0  . LORazepam (ATIVAN) 1 MG tablet TAKE ONE HALF TO ONE TABLET BY MOUTH THREE TIMES DAILY AS NEEDED  90 tablet 1  . metFORMIN (GLUCOPHAGE) 500 MG tablet TAKE ONE TABLET BY MOUTH TWICE DAILY 180 tablet 1  . omeprazole (PRILOSEC) 20 MG capsule Take 20 mg  by mouth daily. PRN    . podophyllum resin (PODOCON-25) 25 % SOLN Apply sparingly for 30-45 min once weekly 15 mL 1  . propranolol (INDERAL) 80 MG tablet TAKE ONE TABLET BY MOUTH THREE TIMES DAILY  270 tablet 1   No current facility-administered medications on file prior to visit.   Allergies  Allergen Reactions  . Peanut-Containing Drug Products Anaphylaxis  . Ceclor [Cefaclor] Hives and Swelling  . Sulfa Antibiotics Hives     BP 136/64 mmHg  Pulse 84  Temp(Src) 98 F (36.7 C) (Temporal)  Resp 18  Ht 6\' 4"  (1.93 m)  Wt 293 lb (132.904 kg)  BMI 35.68 kg/m2  SpO2 98% Objective:   Physical Exam  Constitutional: He is oriented to person, place, and time. Vital signs are normal. He appears well-developed and well-nourished. He does not have a sickly appearance. He does not appear ill. No distress.  HENT:  Head: Normocephalic and atraumatic.  Right Ear: External ear and ear canal normal. No drainage, swelling or tenderness. Tympanic membrane is injected. Tympanic membrane is not scarred, not perforated, not erythematous, not retracted and not bulging. No middle ear effusion.  Left Ear: Tympanic membrane, external ear and ear canal normal. No drainage, swelling or tenderness. Tympanic membrane is not injected, not scarred, not perforated, not erythematous, not retracted and not bulging.  Nose: Nose normal. No mucosal edema, rhinorrhea or sinus tenderness. Right sinus exhibits no maxillary sinus tenderness and no frontal sinus tenderness. Left sinus exhibits no maxillary sinus tenderness and no frontal sinus tenderness.  Mouth/Throat: Uvula is midline, oropharynx is clear and moist and mucous membranes are normal. Mucous membranes are not pale and not dry. No trismus in the jaw. No uvula swelling. No oropharyngeal exudate, posterior oropharyngeal edema, posterior oropharyngeal erythema or tonsillar abscesses.  Left TM is non-erythematous and non edematous.  Clear fluid behind left TM. Right  TM is injected with decreased cone of light, non-erythematous and non-edematous.    Turbinates are erythematous bilaterally.  Eyes: Conjunctivae and lids are normal. Pupils are equal, round, and reactive to light. Right eye exhibits no discharge. Left eye exhibits no discharge. No scleral icterus.  Neck: Trachea normal and normal range of motion. Neck supple. No tracheal tenderness present. No tracheal deviation present. No thyromegaly present.  Cardiovascular: Normal rate, regular rhythm, S1 normal, S2 normal, normal heart sounds, intact distal pulses and normal pulses.  Exam reveals no gallop, no distant heart sounds and no friction rub.   No murmur heard. Pulmonary/Chest: Effort normal and breath sounds normal. No accessory muscle usage or stridor. No tachypnea and no bradypnea. No respiratory distress. He has no decreased breath sounds. He has no wheezes. He has no rhonchi. He has no rales. He exhibits no tenderness.  Abdominal: Soft. Bowel sounds are normal. He exhibits no distension and no mass. There is no tenderness. There is no rebound and no guarding.  Musculoskeletal: Normal range of motion.  Lymphadenopathy:       Head (right side): No submental, no submandibular, no tonsillar, no preauricular, no posterior auricular and no occipital adenopathy present.       Head (left side): No submental, no submandibular, no tonsillar, no preauricular, no posterior auricular and no occipital adenopathy present.    He has no cervical adenopathy.       Right: No supraclavicular adenopathy present.       Left: No supraclavicular adenopathy present.  Neurological: He is alert and oriented to person, place, and time. He has normal strength.  Skin: Skin is warm, dry and intact. No rash noted. He is not diaphoretic. No cyanosis. Nails show no clubbing.  Psychiatric: He has a normal mood and affect. His speech is normal and behavior is normal. Judgment and thought content normal. Cognition and memory are  normal.  Vitals reviewed.     Assessment & Plan:  1. Essential hypertension Continue Clonidine, HCTZ, Lisinopril, Propranolol as prescribed.  Schedule and Keep your follow up appointment in January 2016 with cardiologist Dr. Antoine PocheHochrein.  I encourage you to discuss these palpitations with him and get his opinion. - DASH diet, exercise and monitor at home. Call if greater than 130/80.  -If you are having CP, SOB, jaw pain, numbness and tingling in extremity, change in speech or vision, abdominal pain with nausea and vomiting, then please good to the ER immediately.  2. Depression Start taking Celexa 1/2 tablet (20mg ) by mouth once a day- citalopram (CELEXA) 40 MG tablet; Start taking 1/2 tablet (20mg ) by mouth QDaily.  Dispense: 30 tablet; Refill: 0 Please follow up in one mouth for medication check and possible labs.  3. Anxiety Start taking Celexa 1/2 tablet (20mg ) by mouth once a day- citalopram (CELEXA) 40 MG tablet; Start taking 1/2 tablet (20mg ) by mouth QDaily.  Dispense: 30 tablet; Refill: 0 Continue taking Ativan 1mg  - 1/2 to 1 tablet PO TID for anxiety.  4. Acute right otitis media, recurrence not specified, unspecified otitis media type Patient refuses antibiotic for otitis media right now due to no symptoms and will call the office if the ear pain starts.  5. OSA (obstructive sleep apnea) Continue using CPAP.   Keep your follow up appointments with Dr. Marcelyn BruinsKeith Clance.  6. Medication monitoring encounter Labs done on 09/28/14 and kidney and liver function was normal.  7. Tobacco Use Disorder -Discussed smoking cessation and patient does not want to quit right now.  8. Morbid Obesity- BMI 35.68 Will discuss diet and exercise at follow up visit.  Discussed medication effects and SE's with pt.  Pt agreed with treatment plan.  Please follow up in one month for medication (celexa) recheck. Please schedule and keep your follow up appointments with Dr. Antoine PocheHochrein in January 2016 and  discuss symptoms with him to get his opinion.  Semir Brill, Lise AuerJennifer L, PA-C 10:59 AM [facility]

## 2014-11-11 ENCOUNTER — Other Ambulatory Visit: Payer: Self-pay | Admitting: Emergency Medicine

## 2014-11-13 ENCOUNTER — Other Ambulatory Visit: Payer: Self-pay | Admitting: *Deleted

## 2014-11-13 MED ORDER — METFORMIN HCL 500 MG PO TABS: ORAL_TABLET | ORAL | Status: DC

## 2014-11-29 ENCOUNTER — Ambulatory Visit: Payer: Self-pay | Admitting: Physician Assistant

## 2014-11-30 ENCOUNTER — Ambulatory Visit: Payer: BC Managed Care – PPO | Admitting: Physician Assistant

## 2014-12-05 ENCOUNTER — Encounter: Payer: Self-pay | Admitting: Physician Assistant

## 2014-12-05 ENCOUNTER — Ambulatory Visit (INDEPENDENT_AMBULATORY_CARE_PROVIDER_SITE_OTHER): Payer: BC Managed Care – PPO | Admitting: Physician Assistant

## 2014-12-05 VITALS — BP 122/84 | HR 62 | Temp 98.4°F | Resp 18 | Ht 76.0 in | Wt 283.0 lb

## 2014-12-05 DIAGNOSIS — M26609 Unspecified temporomandibular joint disorder, unspecified side: Secondary | ICD-10-CM

## 2014-12-05 DIAGNOSIS — M2660 Temporomandibular joint disorder, unspecified: Secondary | ICD-10-CM

## 2014-12-05 DIAGNOSIS — A630 Anogenital (venereal) warts: Secondary | ICD-10-CM | POA: Insufficient documentation

## 2014-12-05 DIAGNOSIS — F329 Major depressive disorder, single episode, unspecified: Secondary | ICD-10-CM

## 2014-12-05 DIAGNOSIS — F419 Anxiety disorder, unspecified: Secondary | ICD-10-CM

## 2014-12-05 DIAGNOSIS — F32A Depression, unspecified: Secondary | ICD-10-CM

## 2014-12-05 MED ORDER — CITALOPRAM HYDROBROMIDE 40 MG PO TABS: 40.0000 mg | ORAL_TABLET | Freq: Every day | ORAL | Status: DC

## 2014-12-05 NOTE — Patient Instructions (Addendum)
Increase Celexa to 1 whole tablet daily. Continue all other medications as prescribed. Please ask dentist about night guard for grinding of teeth. Please follow up in 1-2 months for regular follow up with labs and cryo therapy.  What is the TMJ? The temporomandibular (tem-PUH-ro-man-DIB-yoo-ler) joint, or the TMJ, connects the upper and lower jawbones. This joint allows the jaw to open wide and move back and forth when you chew, talk, or yawn.There are also several muscles that help this joint move. There can be muscle tightness and pain in the muscle that can cause several symptoms.  What causes TMJ pain? There are many causes of TMJ pain. Repeated chewing (for example, chewing gum) and clenching your teeth can cause pain in the joint. Some TMJ pain has no obvious cause. What can I do to ease the pain? There are many things you can do to help your pain get better. When you have pain:  Eat soft foods and stay away from chewy foods (for example, taffy) Try to use both sides of your mouth to chew Don't chew gum Massage Don't open your mouth wide (for example, during yawning or singing) Don't bite your cheeks or fingernails Lower your amount of stress and worry Applying a warm, damp washcloth to the joint may help. Over-the-counter pain medicines such as ibuprofen (one brand: Advil) or acetaminophen (one brand: Tylenol) might also help. Do not use these medicines if you are allergic to them or if your doctor told you not to use them. How can I stop the pain from coming back? When your pain is better, you can do these exercises to make your muscles stronger and to keep the pain from coming back:  Resisted mouth opening: Place your thumb or two fingers under your chin and open your mouth slowly, pushing up lightly on your chin with your thumb. Hold for three to six seconds. Close your mouth slowly. Resisted mouth closing: Place your thumbs under your chin and your two index fingers on the ridge  between your mouth and the bottom of your chin. Push down lightly on your chin as you close your mouth. Tongue up: Slowly open and close your mouth while keeping the tongue touching the roof of the mouth. Side-to-side jaw movement: Place an object about one fourth of an inch thick (for example, two tongue depressors) between your front teeth. Slowly move your jaw from side to side. Increase the thickness of the object as the exercise becomes easier Forward jaw movement: Place an object about one fourth of an inch thick between your front teeth and move the bottom jaw forward so that the bottom teeth are in front of the top teeth. Increase the thickness of the object as the exercise becomes easier. These exercises should not be painful. If it hurts to do these exercises, stop doing them and talk to your family doctor.    Generalized Anxiety Disorder Generalized anxiety disorder (GAD) is a mental disorder. It interferes with life functions, including relationships, work, and school. GAD is different from normal anxiety, which everyone experiences at some point in their lives in response to specific life events and activities. Normal anxiety actually helps us prepare for and get through these life events and activities. Normal anxiety goes away after the event or activity is over.  GAD causes anxiety that is not necessarily related to specific events or activities. It also causes excess anxiety in proportion to specific events or activities. The anxiety associated with GAD is also difficult to  control. GAD can vary from mild to severe. People with severe GAD can have intense waves of anxiety with physical symptoms (panic attacks).  SYMPTOMS The anxiety and worry associated with GAD are difficult to control. This anxiety and worry are related to many life events and activities and also occur more days than not for 6 months or longer. People with GAD also have three or more of the following symptoms (one or  more in children):  Restlessness.   Fatigue.  Difficulty concentrating.   Irritability.  Muscle tension.  Difficulty sleeping or unsatisfying sleep. DIAGNOSIS GAD is diagnosed through an assessment by your health care provider. Your health care provider will ask you questions aboutyour mood,physical symptoms, and events in your life. Your health care provider may ask you about your medical history and use of alcohol or drugs, including prescription medicines. Your health care provider may also do a physical exam and blood tests. Certain medical conditions and the use of certain substances can cause symptoms similar to those associated with GAD. Your health care provider may refer you to a mental health specialist for further evaluation. TREATMENT The following therapies are usually used to treat GAD:   Medication. Antidepressant medication usually is prescribed for long-term daily control. Antianxiety medicines may be added in severe cases, especially when panic attacks occur.   Talk therapy (psychotherapy). Certain types of talk therapy can be helpful in treating GAD by providing support, education, and guidance. A form of talk therapy called cognitive behavioral therapy can teach you healthy ways to think about and react to daily life events and activities.  Stress managementtechniques. These include yoga, meditation, and exercise and can be very helpful when they are practiced regularly. A mental health specialist can help determine which treatment is best for you. Some people see improvement with one therapy. However, other people require a combination of therapies. Document Released: 04/11/2013 Document Revised: 05/01/2014 Document Reviewed: 04/11/2013 [facility] Patient Information 2015 [employer] [place]. This information is not intended to replace advice given to you by your health care provider. Make sure you discuss any questions you have with your health care provider.

## 2014-12-05 NOTE — Progress Notes (Signed)
HPI A Caucasian 28 y.o.male presents for one month follow up for depression and anxiety medication.  Patient currently takes Ativan 1mg  (1/2 to 1 tablet TID) and was started on Celexa 40mg  (1/2 tablet daily) at last visit on 11/01/14.  Patient states he does not notice a difference from Celexa right now.  States he had to put his sick aunt into a nursing home since last visit, and is having more stress about her not being happy there.  He does admit to clenching his teeth all the time when sleeping  Genital Warts- Patient states Dr. Oneta RackMcKeown told him he has genital warts and is on Podocon-25 right now.  States it is not helping and would like to do cryotherapy for genital warts at next visit with Dr. Oneta RackMcKeown.  Patient states there is a large (1-2cm) genital wart on the right side of groin that will not go away.  He states his partner had them and was given topical medication and his went away.  Past Medical History  Diagnosis Date  . Hidradenitis suppurativa   . Hypertension   . Depression   . Anxiety   . Prediabetes     Allergies  Allergen Reactions  . Peanut-Containing Drug Products Anaphylaxis  . Ceclor [Cefaclor] Hives and Swelling  . Sulfa Antibiotics Hives    Current Outpatient Prescriptions on File Prior to Visit  Medication Sig Dispense Refill  . aspirin EC 81 MG tablet Take 81 mg by mouth 2 (two) times daily.    . citalopram (CELEXA) 40 MG tablet Start taking 1/2 tablet (20mg ) by mouth QDaily. 30 tablet 0  . cloNIDine (CATAPRES) 0.2 MG tablet TAKE ONE HALF TO ONE TABLET BY MOUTH THREE TIMES DAILY for blood pressure. 90 tablet 0  . fish oil-omega-3 fatty acids 1000 MG capsule Take 1 g by mouth 2 (two) times daily.    . Flaxseed, Linseed, (FLAX SEED OIL PO) Take 1 tablet by mouth 2 (two) times daily.    . hydrochlorothiazide (HYDRODIURIL) 25 MG tablet TAKE ONE TABLET BY MOUTH ONE TIME DAILY 90 tablet 0  . lisinopril (PRINIVIL,ZESTRIL) 20 MG tablet Take 1 tablet (20 mg total) by mouth  2 (two) times daily. 180 tablet 0  . LORazepam (ATIVAN) 1 MG tablet TAKE ONE HALF TO ONE TABLET BY MOUTH THREE TIMES DAILY AS NEEDED  90 tablet 1  . metFORMIN (GLUCOPHAGE) 500 MG tablet TAKE ONE TABLET BY MOUTH TWICE DAILY 180 tablet 1  . omeprazole (PRILOSEC) 20 MG capsule Take 20 mg by mouth daily. PRN    . podophyllum resin (PODOCON-25) 25 % SOLN Apply sparingly for 30-45 min once weekly 15 mL 1  . propranolol (INDERAL) 80 MG tablet TAKE ONE TABLET BY MOUTH THREE TIMES DAILY  270 tablet 1   No current facility-administered medications on file prior to visit.   ROS:  Review of Systems  Constitutional: Positive for malaise/fatigue. Negative for fever, chills and diaphoresis.  HENT: Negative.   Eyes: Negative.   Respiratory: Negative.   Cardiovascular: Negative.  Negative for chest pain.       No chest pain right now.  States chest pain comes and goes.  Sometimes sharp and then will go on a little bit and then stop.  States it might be the anxiety.  Gastrointestinal: Negative.   Genitourinary: Negative.   Musculoskeletal: Negative.   Skin: Negative.   Neurological: Negative.   Psychiatric/Behavioral: The patient is nervous/anxious.        Stress.  Had to put  aunt into nursing home since last visit.     Physical Exam: BP 122/84 mmHg  Pulse 62  Temp(Src) 98.4 F (36.9 C) (Temporal)  Resp 18  Ht 6\' 4"  (1.93 m)  Wt 283 lb (128.368 kg)  BMI 34.46 kg/m2  SpO2 98%  Wt Readings from Last 3 Encounters:  12/05/14 283 lb (128.368 kg)  11/01/14 293 lb (132.904 kg)  10/13/14 294 lb 9.6 oz (133.63 kg)  Vitals Reviewed. General Appearance: Well nourished, in no apparent distress and had pleasant demeanor. Obese.  Eyes:  PERRLA. Conjunctiva is pink without swelling or redness.  No Scleral icterus.  Respiratory: Chest wall expansion symmetrical.  CTAB,  r/r/w, No stridor.  No increased effort of breathing. Cardio: RRR.   m/r/g.  S1S2nl.   Abdomen: Symmetrical, distended, soft, and  nontender.  +BS nl x4.   Rebound.  Guarding.  Skin: Warm, dry, intact without ecchymosis, yellowing, cyanosis.  Neuro: Alert and oriented X3, cooperative.  Mood and affect appropriate to situation.   Psych: Insight and Judgment appropriate.   Assessment and Plan: 1. Depression Increase Celexa 40mg  to 1 whole tablet daily.  2. Anxiety Continue Ativan as prescribed.  3. TMJ Please contact dentist for night guard. Please do recommended exercises for TMJ on handout.  4. Genital Warts Continue Podocon-25 as prescribed. Will do Cryo therapy at your follow up appt in January 2016.  Discussed medication effects and SE's.  Pt agreed to treatment plan. Please follow up in January 2016 for regular follow up with labs and cyrotherapy.     Krisi Azua, Lise AuerJennifer L, PA-C 9:35 AM [facility]

## 2014-12-08 ENCOUNTER — Other Ambulatory Visit: Payer: Self-pay | Admitting: Internal Medicine

## 2014-12-08 MED ORDER — LORAZEPAM 1 MG PO TABS: ORAL_TABLET | ORAL | Status: DC

## 2015-01-01 ENCOUNTER — Other Ambulatory Visit: Payer: Self-pay | Admitting: Physician Assistant

## 2015-01-09 ENCOUNTER — Ambulatory Visit (INDEPENDENT_AMBULATORY_CARE_PROVIDER_SITE_OTHER): Payer: BC Managed Care – PPO | Admitting: Cardiology

## 2015-01-09 ENCOUNTER — Encounter: Payer: Self-pay | Admitting: Cardiology

## 2015-01-09 VITALS — BP 130/92 | HR 59 | Ht 77.0 in | Wt 282.1 lb

## 2015-01-09 DIAGNOSIS — I10 Essential (primary) hypertension: Secondary | ICD-10-CM

## 2015-01-09 DIAGNOSIS — I351 Nonrheumatic aortic (valve) insufficiency: Secondary | ICD-10-CM

## 2015-01-09 NOTE — Patient Instructions (Addendum)
Dr Antoine PocheHochrein has ordered the following test(s) to be done: 1. Echocardiogram in September - Echocardiography is a painless test that uses sound waves to create images of your heart. It provides your doctor with information about the size and shape of your heart and how well your heart's chambers and valves are working. This procedure takes approximately one hour. There are no restrictions for this procedure.  Dr Antoine PocheHochrein wants you to follow-up in 1 year. You will receive a reminder letter in the mail one months in advance. If you don't receive a letter, please call our office to schedule the follow-up appointment.

## 2015-01-09 NOTE — Progress Notes (Signed)
HPI The patient presents for follow up of dyspnea .  He was noted on the previous evaluation to have an echo with mild to moderate aortic insufficiency. There were no significant other abnormalities. He thinks his dyspnea is at baseline. He is using CPAP to treat sleep apnea. He's not having any new PND or orthopnea. He's not having any new palpitations, presyncope or syncope. He denies any chest pressure, neck or arm discomfort. He walks quite a bit at work.  Allergies  Allergen Reactions  . Peanut-Containing Drug Products Anaphylaxis  . Ceclor [Cefaclor] Hives and Swelling  . Sulfa Antibiotics Hives    Current Outpatient Prescriptions  Medication Sig Dispense Refill  . aspirin EC 81 MG tablet Take 81 mg by mouth 2 (two) times daily.    . citalopram (CELEXA) 40 MG tablet TAKE HALF TABLET BY MOUTH DAILY  30 tablet 2  . cloNIDine (CATAPRES) 0.2 MG tablet TAKE ONE HALF TO ONE TABLET BY MOUTH THREE TIMES DAILY for blood pressure. 90 tablet 0  . fish oil-omega-3 fatty acids 1000 MG capsule Take 1 g by mouth 2 (two) times daily.    . Flaxseed, Linseed, (FLAX SEED OIL PO) Take 1 tablet by mouth 2 (two) times daily.    . hydrochlorothiazide (HYDRODIURIL) 25 MG tablet TAKE ONE TABLET BY MOUTH ONE TIME DAILY 90 tablet 0  . lisinopril (PRINIVIL,ZESTRIL) 20 MG tablet Take 1 tablet (20 mg total) by mouth 2 (two) times daily. 180 tablet 0  . LORazepam (ATIVAN) 1 MG tablet TAKE ONE HALF TO ONE TABLET BY MOUTH THREE TIMES DAILY AS NEEDED 90 tablet 2  . metFORMIN (GLUCOPHAGE) 500 MG tablet TAKE ONE TABLET BY MOUTH TWICE DAILY 180 tablet 1  . omeprazole (PRILOSEC) 20 MG capsule Take 20 mg by mouth daily. PRN    . podophyllum resin (PODOCON-25) 25 % SOLN Apply sparingly for 30-45 min once weekly 15 mL 1  . propranolol (INDERAL) 80 MG tablet TAKE ONE TABLET BY MOUTH THREE TIMES DAILY  270 tablet 1   No current facility-administered medications for this visit.    Past Medical History  Diagnosis Date  .  Hidradenitis suppurativa   . Hypertension   . Depression   . Anxiety   . Prediabetes     Past Surgical History  Procedure Laterality Date  . None      ROS:  As stated in the HPI and negative for all other systems.  PHYSICAL EXAM BP 130/92 mmHg  Pulse 59  Ht  (1.956 m)  Wt 282 lb 1.6 oz (127.96 kg)  BMI 33.45 kg/m2 GENERAL:  Well appearing NECK:  No jugular venous distention, waveform within normal limits, carotid upstroke brisk and symmetric, no bruits, no thyromegaly LUNGS:  Clear to auscultation bilaterally CHEST:  Unremarkable HEART:  PMI not displaced or sustained,S1 and S2 within normal limits, no S3, no S4, no clicks, no rubs, no murmurs ABD:  Flat, positive bowel sounds normal in frequency in pitch, no bruits, no rebound, no guarding, no midline pulsatile mass, no hepatomegaly, no splenomegaly EXT:  2 plus pulses throughout, no edema, no cyanosis no clubbing  EKG:  Sinus rhythm, rate 58, axis within normal limits, intervals within normal limits, no acute ST-T wave changes.  01/09/2015   ASSESSMENT AND PLAN  DYSPNEA:   This seems to be at baseline and not likely related to the aortic insufficiency at this point. BNP was normal. It is at baseline. No further workup is suggested.  HTN:  His Blood pressure is slightly elevated. However, like to control this with weight loss and treatment of his sleep apnea. He's not significant blood pressure regimen.  SLEEP APNEA:   Per Dr. Shelle Ironlance  TOBACCO: He understands the need to stop smoking.  We talked about this again today.  AI: I will follow up an echocardiogram in September. No change in therapy is indicated at this point.

## 2015-01-18 ENCOUNTER — Other Ambulatory Visit: Payer: Self-pay | Admitting: Physician Assistant

## 2015-01-19 ENCOUNTER — Encounter: Payer: Self-pay | Admitting: Internal Medicine

## 2015-01-26 ENCOUNTER — Ambulatory Visit: Payer: Self-pay | Admitting: Internal Medicine

## 2015-01-31 ENCOUNTER — Telehealth: Payer: Self-pay | Admitting: *Deleted

## 2015-01-31 NOTE — Telephone Encounter (Signed)
[facility].  [facility] but area not draining and patient in severe pain.  Appointment made for today at 3:45 but patient will reschedule.

## 2015-02-14 ENCOUNTER — Other Ambulatory Visit: Payer: Self-pay | Admitting: Internal Medicine

## 2015-02-14 MED ORDER — CLONIDINE HCL 0.2 MG PO TABS: ORAL_TABLET | ORAL | Status: DC

## 2015-02-14 MED ORDER — LISINOPRIL 20 MG PO TABS
20.0000 mg | ORAL_TABLET | Freq: Two times a day (BID) | ORAL | Status: DC
Start: 2015-02-14 — End: 2015-07-09

## 2015-02-14 MED ORDER — HYDROCHLOROTHIAZIDE 25 MG PO TABS: ORAL_TABLET | ORAL | Status: DC

## 2015-02-15 ENCOUNTER — Encounter: Payer: Self-pay | Admitting: Internal Medicine

## 2015-02-15 MED ORDER — CITALOPRAM HYDROBROMIDE 40 MG PO TABS: 40.0000 mg | ORAL_TABLET | Freq: Every day | ORAL | Status: DC

## 2015-03-09 ENCOUNTER — Encounter: Payer: Self-pay | Admitting: Internal Medicine

## 2015-03-09 ENCOUNTER — Ambulatory Visit (INDEPENDENT_AMBULATORY_CARE_PROVIDER_SITE_OTHER): Payer: BLUE CROSS/BLUE SHIELD | Admitting: Internal Medicine

## 2015-03-09 VITALS — BP 122/74 | HR 64 | Temp 97.7°F | Resp 16 | Ht 76.0 in | Wt 275.0 lb

## 2015-03-09 DIAGNOSIS — Z79899 Other long term (current) drug therapy: Secondary | ICD-10-CM

## 2015-03-09 DIAGNOSIS — R7303 Prediabetes: Secondary | ICD-10-CM

## 2015-03-09 DIAGNOSIS — I10 Essential (primary) hypertension: Secondary | ICD-10-CM

## 2015-03-09 DIAGNOSIS — E559 Vitamin D deficiency, unspecified: Secondary | ICD-10-CM

## 2015-03-09 DIAGNOSIS — K219 Gastro-esophageal reflux disease without esophagitis: Secondary | ICD-10-CM

## 2015-03-09 DIAGNOSIS — R7309 Other abnormal glucose: Secondary | ICD-10-CM

## 2015-03-09 DIAGNOSIS — E782 Mixed hyperlipidemia: Secondary | ICD-10-CM

## 2015-03-09 LAB — LIPID PANEL
Cholesterol: 162 mg/dL (ref 0–200)
HDL: 21 mg/dL — ABNORMAL LOW (ref >=40)
LDL Cholesterol: 96 mg/dL (ref 0–99)
Total CHOL/HDL Ratio: 7.7 Ratio
Triglycerides: 223 mg/dL — ABNORMAL HIGH (ref <150)
VLDL: 45 mg/dL — ABNORMAL HIGH (ref 0–40)

## 2015-03-09 LAB — CBC WITH DIFFERENTIAL/PLATELET
Basophils Absolute: 0 10*3/uL (ref 0.0–0.1)
Basophils Relative: 0 % (ref 0–1)
EOS ABS: 0.2 10*3/uL (ref 0.0–0.7)
Eosinophils Relative: 2 % (ref 0–5)
HCT: 39.9 % (ref 39.0–52.0)
HEMOGLOBIN: 13.9 g/dL (ref 13.0–17.0)
LYMPHS ABS: 2 10*3/uL (ref 0.7–4.0)
LYMPHS PCT: 27 % (ref 12–46)
MCH: 30.2 pg (ref 26.0–34.0)
MCHC: 34.8 g/dL (ref 30.0–36.0)
MCV: 86.7 fL (ref 78.0–100.0)
MPV: 10.3 fL (ref 8.6–12.4)
Monocytes Absolute: 0.4 10*3/uL (ref 0.1–1.0)
Monocytes Relative: 5 % (ref 3–12)
NEUTROS ABS: 5 10*3/uL (ref 1.7–7.7)
NEUTROS PCT: 66 % (ref 43–77)
Platelets: 161 10*3/uL (ref 150–400)
RBC: 4.6 MIL/uL (ref 4.22–5.81)
RDW: 13.8 % (ref 11.5–15.5)
WBC: 7.5 10*3/uL (ref 4.0–10.5)

## 2015-03-09 LAB — BASIC METABOLIC PANEL WITH GFR
BUN: 15 mg/dL (ref 6–23)
CO2: 27 mEq/L (ref 19–32)
Calcium: 9 mg/dL (ref 8.4–10.5)
Chloride: 101 mEq/L (ref 96–112)
Creat: 0.98 mg/dL (ref 0.50–1.35)
GLUCOSE: 93 mg/dL (ref 70–99)
POTASSIUM: 4 meq/L (ref 3.5–5.3)
Sodium: 137 mEq/L (ref 135–145)

## 2015-03-09 LAB — HEPATIC FUNCTION PANEL
ALBUMIN: 4.4 g/dL (ref 3.5–5.2)
ALT: 20 U/L (ref 0–53)
AST: 15 U/L (ref 0–37)
Alkaline Phosphatase: 46 U/L (ref 39–117)
BILIRUBIN TOTAL: 0.5 mg/dL (ref 0.2–1.2)
Bilirubin, Direct: 0.1 mg/dL (ref 0.0–0.3)
Indirect Bilirubin: 0.4 mg/dL (ref 0.2–1.2)
TOTAL PROTEIN: 6.4 g/dL (ref 6.0–8.3)

## 2015-03-09 LAB — TSH: TSH: 0.679 u[IU]/mL (ref 0.350–4.500)

## 2015-03-09 LAB — MAGNESIUM: MAGNESIUM: 2 mg/dL (ref 1.5–2.5)

## 2015-03-09 NOTE — Patient Instructions (Signed)

## 2015-03-09 NOTE — Progress Notes (Signed)
Patient ID: Spencer Love, male   DOB: April 06, 1986, 29 y.o.   MRN: [id number]   This very nice 29 y.o. SINGLE WM presents for 3 month follow up with Hypertension, Hyperlipidemia, Pre-Diabetes and Vitamin D Deficiency.    Patient is treated for HTN since Jan 2014 & BP has been controlled at home. Today's BP: 122/74 mmHg. Patient has had no complaints of any cardiac type chest pain, palpitations, dyspnea/orthopnea/PND, dizziness, claudication, or dependent edema.   Hyperlipidemia is controlled with diet & meds. Patient denies myalgias or other med SE's. Last Lipids were at goal  - Total Chol  155; HDL-C 24; LDL  73;with elevated  Trig 289 on 09/28/2014.   Also, the patient has history of PreDiabetes and Insulin Resistance and has been started on Metformin to hopefully help with weight loss by decreasing his insulin resistance. He's  has had no symptoms of reactive hypoglycemia, diabetic polys, paresthesias or visual blurring.  Last A1c was  5.2% and elevated insulin 115 on 09/28/2014.   Further, the patient also has history of Vitamin D Deficiency and does not currently supplement vitamin D. Last vitamin D was  23 on 09/28/2014.   Medication Sig  . aspirin EC 81 MG tablet Take 81 mg by mouth 2 (two) times daily.  . citalopram (CELEXA) 40 MG tablet Take 1 tablet (40 mg total) by mouth daily.  . cloNIDine (CATAPRES) 0.2 MG tablet TAKE ONE HALF TO ONE TABLET BY MOUTH THREE TIMES DAILY for blood pressure.  . fish oil 1000 MG cap Take 1 g by mouth 2 (two) times daily.  Marland Kitchen FLAX SEED OIL  Take 1 tablet by mouth 2 (two) times daily.  . hctz  25 MG tablet TAKE ONE TABLET BY MOUTH ONE TIME DAILY  . lisinopril (PRINIVIL,ZESTRIL) 20 MG tablet Take 1 tablet (20 mg total) by mouth 2 (two) times daily.  Marland Kitchen LORazepam (ATIVAN) 1 MG tablet TAKE ONE HALF TO ONE TABLET BY MOUTH THREE TIMES DAILY AS NEEDED  . metFORMIN  500 MG tablet TAKE ONE TABLET BY MOUTH TWICE DAILY  . omeprazole  20 MG capsule Take 20 mg by mouth  daily. PRN  . propranolol (INDERAL) 80 MG tablet TAKE ONE TABLET BY MOUTH THREE TIMES DAILY   . podophyllum resin (PODOCON-25) 25 % SOLN Apply sparingly for 30-45 min once weekly (Patient not taking: Reported on 03/09/2015)   Allergies  Allergen Reactions  . Peanut-Containing Drug Products Anaphylaxis  . Ceclor [Cefaclor] Hives and Swelling  . Sulfa Antibiotics Hives   PMHx:   Past Medical History  Diagnosis Date  . Hidradenitis suppurativa   . Hypertension   . Depression   . Anxiety   . Prediabetes    Immunization History  Administered Date(s) Administered  . Influenza,inj,Quad PF,36+ Mos 10/13/2014   Past Surgical History  Procedure Laterality Date  . None     FHx:    Reviewed / unchanged  SHx:    Reviewed / unchanged  Systems Review:  Constitutional: Denies fever, chills, wt changes, headaches, insomnia, fatigue, night sweats, change in appetite. Eyes: Denies redness, blurred vision, diplopia, discharge, itchy, watery eyes.  ENT: Denies discharge, congestion, post nasal drip, epistaxis, sore throat, earache, hearing loss, dental pain, tinnitus, vertigo, sinus pain, snoring.  CV: Denies chest pain, palpitations, irregular heartbeat, syncope, dyspnea, diaphoresis, orthopnea, PND, claudication or edema. Respiratory: denies cough, dyspnea, DOE, pleurisy, hoarseness, laryngitis, wheezing.  Gastrointestinal: Denies dysphagia, odynophagia, heartburn, reflux, water brash, abdominal pain or cramps, nausea, vomiting, bloating, diarrhea, constipation,  hematemesis, melena, hematochezia  or hemorrhoids. Genitourinary: Denies dysuria, frequency, urgency, nocturia, hesitancy, discharge, hematuria or flank pain. Musculoskeletal: Denies arthralgias, myalgias, stiffness, jt. swelling, pain, limping or strain/sprain.  Skin: Denies pruritus, rash, hives, warts, acne, eczema or change in skin lesion(s). Neuro: No weakness, tremor, incoordination, spasms, paresthesia or pain. Psychiatric:  Denies confusion, memory loss or sensory loss. Endo: Denies change in weight, skin or hair change.  Heme/Lymph: No excessive bleeding, bruising or enlarged lymph nodes.  Physical Exam  BP 122/74   Pulse 64  Temp(Src) 97.7 F   Resp 16  Ht 6\' 4"    Wt 275 lb     BMI 33.49   Appears well nourished and in no distress. Eyes: PERRLA, EOMs, conjunctiva no swelling or erythema. Sinuses: No frontal/maxillary tenderness ENT/Mouth: EAC's clear, TM's nl w/o erythema, bulging. Nares clear w/o erythema, swelling, exudates. Oropharynx clear without erythema or exudates. Oral hygiene is good. Tongue normal, non obstructing. Hearing intact.  Neck: Supple. Thyroid nl. Car 2+/2+ without bruits, nodes or JVD. Chest: Respirations nl with BS clear & equal w/o rales, rhonchi, wheezing or stridor.  Cor: Heart sounds normal w/ regular rate and rhythm without sig. murmurs, gallops, clicks, or rubs. Peripheral pulses normal and equal  without edema.  Abdomen: Soft & bowel sounds normal. Non-tender w/o guarding, rebound, hernias, masses, or organomegaly.  Lymphatics: Unremarkable.  Musculoskeletal: Full ROM all peripheral extremities, joint stability, 5/5 strength, and normal gait.  Skin: Warm, dry without exposed rashes, lesions or ecchymosis apparent.  Neuro: Cranial nerves intact, reflexes equal bilaterally. Sensory-motor testing grossly intact. Tendon reflexes grossly intact.  Pysch: Alert & oriented x 3.  Insight and judgement nl & appropriate. No ideations.  Assessment and Plan:  1. Essential hypertension  - TSH  2. Mixed hyperlipidemia  - Lipid panel  3. Prediabetes  - Hemoglobin A1c - Insulin, random  4. Vitamin D deficiency  - Vit D  25 hydroxy (rtn osteoporosis monitoring)  5. Encounter for long-term (current) use of medications  - CBC with Differential/Platelet - BASIC METABOLIC PANEL WITH GFR - Hepatic function panel - Magnesium   Recommended regular exercise, BP monitoring,  weight control, and discussed med and SE's. Recommended labs to assess and monitor clinical status. Further disposition pending results of labs.

## 2015-03-10 LAB — HEMOGLOBIN A1C
Hgb A1c MFr Bld: 5.1 % (ref <5.7)
Mean Plasma Glucose: 100 mg/dL (ref <117)

## 2015-03-10 LAB — VITAMIN D 25 HYDROXY (VIT D DEFICIENCY, FRACTURES): Vit D, 25-Hydroxy: 69 ng/mL (ref 30–100)

## 2015-03-10 LAB — INSULIN, RANDOM: Insulin: 10.1 u[IU]/mL (ref 2.0–19.6)

## 2015-03-11 ENCOUNTER — Encounter: Payer: Self-pay | Admitting: Internal Medicine

## 2015-03-11 DIAGNOSIS — K219 Gastro-esophageal reflux disease without esophagitis: Secondary | ICD-10-CM | POA: Insufficient documentation

## 2015-03-22 ENCOUNTER — Other Ambulatory Visit: Payer: Self-pay | Admitting: Internal Medicine

## 2015-04-17 ENCOUNTER — Ambulatory Visit: Payer: BC Managed Care – PPO | Admitting: Pulmonary Disease

## 2015-04-24 ENCOUNTER — Encounter: Payer: Self-pay | Admitting: Internal Medicine

## 2015-05-25 ENCOUNTER — Other Ambulatory Visit: Payer: Self-pay | Admitting: Physician Assistant

## 2015-06-05 ENCOUNTER — Other Ambulatory Visit: Payer: Self-pay | Admitting: Internal Medicine

## 2015-06-13 ENCOUNTER — Other Ambulatory Visit: Payer: Self-pay

## 2015-06-13 MED ORDER — METFORMIN HCL 500 MG PO TABS: ORAL_TABLET | ORAL | Status: DC

## 2015-06-14 ENCOUNTER — Encounter: Payer: Self-pay | Admitting: Internal Medicine

## 2015-06-14 ENCOUNTER — Ambulatory Visit (INDEPENDENT_AMBULATORY_CARE_PROVIDER_SITE_OTHER): Payer: BLUE CROSS/BLUE SHIELD | Admitting: Internal Medicine

## 2015-06-14 VITALS — BP 124/62 | HR 86 | Temp 98.2°F | Resp 18 | Ht 76.0 in | Wt 275.0 lb

## 2015-06-14 DIAGNOSIS — R7309 Other abnormal glucose: Secondary | ICD-10-CM

## 2015-06-14 DIAGNOSIS — E559 Vitamin D deficiency, unspecified: Secondary | ICD-10-CM

## 2015-06-14 DIAGNOSIS — Z79899 Other long term (current) drug therapy: Secondary | ICD-10-CM

## 2015-06-14 DIAGNOSIS — I10 Essential (primary) hypertension: Secondary | ICD-10-CM

## 2015-06-14 DIAGNOSIS — E782 Mixed hyperlipidemia: Secondary | ICD-10-CM

## 2015-06-14 DIAGNOSIS — R7303 Prediabetes: Secondary | ICD-10-CM

## 2015-06-14 LAB — BASIC METABOLIC PANEL WITH GFR
BUN: 15 mg/dL (ref 6–23)
CHLORIDE: 98 meq/L (ref 96–112)
CO2: 29 meq/L (ref 19–32)
Calcium: 9.2 mg/dL (ref 8.4–10.5)
Creat: 0.96 mg/dL (ref 0.50–1.35)
GFR, Est African American: >89 mL/min
Glucose, Bld: 82 mg/dL (ref 70–99)
POTASSIUM: 3.9 meq/L (ref 3.5–5.3)
Sodium: 136 mEq/L (ref 135–145)

## 2015-06-14 LAB — LIPID PANEL
CHOLESTEROL: 137 mg/dL (ref 0–200)
HDL: 18 mg/dL — ABNORMAL LOW (ref >=40)
LDL CALC: 57 mg/dL (ref 0–99)
TRIGLYCERIDES: 312 mg/dL — AB (ref <150)
Total CHOL/HDL Ratio: 7.6 Ratio
VLDL: 62 mg/dL — ABNORMAL HIGH (ref 0–40)

## 2015-06-14 LAB — CBC WITH DIFFERENTIAL/PLATELET
BASOS ABS: 0.1 10*3/uL (ref 0.0–0.1)
BASOS PCT: 1 % (ref 0–1)
Eosinophils Absolute: 0.2 10*3/uL (ref 0.0–0.7)
Eosinophils Relative: 3 % (ref 0–5)
HCT: 41.9 % (ref 39.0–52.0)
HEMOGLOBIN: 15.3 g/dL (ref 13.0–17.0)
Lymphocytes Relative: 28 % (ref 12–46)
Lymphs Abs: 2 10*3/uL (ref 0.7–4.0)
MCH: 31.7 pg (ref 26.0–34.0)
MCHC: 36.5 g/dL — ABNORMAL HIGH (ref 30.0–36.0)
MCV: 86.7 fL (ref 78.0–100.0)
MPV: 10.8 fL (ref 8.6–12.4)
Monocytes Absolute: 0.4 10*3/uL (ref 0.1–1.0)
Monocytes Relative: 5 % (ref 3–12)
NEUTROS PCT: 63 % (ref 43–77)
Neutro Abs: 4.6 10*3/uL (ref 1.7–7.7)
PLATELETS: 192 10*3/uL (ref 150–400)
RBC: 4.83 MIL/uL (ref 4.22–5.81)
RDW: 13.5 % (ref 11.5–15.5)
WBC: 7.3 10*3/uL (ref 4.0–10.5)

## 2015-06-14 LAB — HEMOGLOBIN A1C
Hgb A1c MFr Bld: 5.2 % (ref <5.7)
Mean Plasma Glucose: 103 mg/dL (ref <117)

## 2015-06-14 LAB — HEPATIC FUNCTION PANEL
ALT: 28 U/L (ref 0–53)
AST: 20 U/L (ref 0–37)
Albumin: 4.4 g/dL (ref 3.5–5.2)
Alkaline Phosphatase: 60 U/L (ref 39–117)
BILIRUBIN INDIRECT: 0.3 mg/dL (ref 0.2–1.2)
Bilirubin, Direct: 0.1 mg/dL (ref 0.0–0.3)
TOTAL PROTEIN: 6.4 g/dL (ref 6.0–8.3)
Total Bilirubin: 0.4 mg/dL (ref 0.2–1.2)

## 2015-06-14 LAB — TSH: TSH: 0.825 u[IU]/mL (ref 0.350–4.500)

## 2015-06-14 LAB — MAGNESIUM: MAGNESIUM: 2.1 mg/dL (ref 1.5–2.5)

## 2015-06-14 MED ORDER — BUPROPION HCL ER (XL) 150 MG PO TB24: 150.0000 mg | ORAL_TABLET | ORAL | Status: DC

## 2015-06-14 NOTE — Patient Instructions (Signed)
Bupropion extended-release tablets (Depression/Mood Disorders) What is this medicine? BUPROPION (byoo PROE pee on) is used to treat depression. This medicine may be used for other purposes; ask your health care provider or pharmacist if you have questions. COMMON BRAND NAME(S): Aplenzin, Budeprion XL, Forfivo XL, Wellbutrin XL What should I tell my health care provider before I take this medicine? They need to know if you have any of these conditions: -an eating disorder, such as anorexia or bulimia -bipolar disorder or psychosis -diabetes or high blood sugar, treated with medication -glaucoma -head injury or brain tumor -heart disease, previous heart attack, or irregular heart beat -high blood pressure -kidney or liver disease -seizures (convulsions) -suicidal thoughts or a previous suicide attempt -Tourette's syndrome -weight loss -an unusual or allergic reaction to bupropion, other medicines, foods, dyes, or preservatives -breast-feeding -pregnant or trying to become pregnant How should I use this medicine? Take this medicine by mouth with a glass of water. Follow the directions on the prescription label. You can take it with or without food. If it upsets your stomach, take it with food. Do not crush, chew, or cut these tablets. This medicine is taken once daily at the same time each day. Do not take your medicine more often than directed. Do not stop taking this medicine suddenly except upon the advice of your doctor. Stopping this medicine too quickly may cause serious side effects or your condition may worsen. A special MedGuide will be given to you by the pharmacist with each prescription and refill. Be sure to read this information carefully each time. Talk to your pediatrician regarding the use of this medicine in children. Special care may be needed. Overdosage: If you think you have taken too much of this medicine contact a poison control center or emergency room at once. NOTE:  This medicine is only for you. Do not share this medicine with others. What if I miss a dose? If you miss a dose, skip the missed dose and take your next tablet at the regular time. Do not take double or extra doses. What may interact with this medicine? Do not take this medicine with any of the following medications: -linezolid -MAOIs like Azilect, Carbex, Eldepryl, Marplan, Nardil, and Parnate -methylene blue (injected into a vein) -other medicines that contain bupropion like Zyban This medicine may also interact with the following medications: -alcohol -certain medicines for anxiety or sleep -certain medicines for blood pressure like metoprolol, propranolol -certain medicines for depression or psychotic disturbances -certain medicines for HIV or AIDS like efavirenz, lopinavir, nelfinavir, ritonavir -certain medicines for irregular heart beat like propafenone, flecainide -certain medicines for Parkinson's disease like amantadine, levodopa -certain medicines for seizures like carbamazepine, phenytoin, phenobarbital -cimetidine -clopidogrel -cyclophosphamide -furazolidone -isoniazid -nicotine -orphenadrine -procarbazine -steroid medicines like prednisone or cortisone -stimulant medicines for attention disorders, weight loss, or to stay awake -tamoxifen -theophylline -thiotepa -ticlopidine -tramadol -warfarin This list may not describe all possible interactions. Give your health care provider a list of all the medicines, herbs, non-prescription drugs, or dietary supplements you use. Also tell them if you smoke, drink alcohol, or use illegal drugs. Some items may interact with your medicine. What should I watch for while using this medicine? Tell your doctor if your symptoms do not get better or if they get worse. Visit your doctor or health care professional for regular checks on your progress. Because it may take several weeks to see the full effects of this medicine, it is  important to continue your treatment as prescribed   by your doctor. Patients and their families should watch out for new or worsening thoughts of suicide or depression. Also watch out for sudden changes in feelings such as feeling anxious, agitated, panicky, irritable, hostile, aggressive, impulsive, severely restless, overly excited and hyperactive, or not being able to sleep. If this happens, especially at the beginning of treatment or after a change in dose, call your health care professional. Avoid alcoholic drinks while taking this medicine. Drinking large amounts of alcoholic beverages, using sleeping or anxiety medicines, or quickly stopping the use of these agents while taking this medicine may increase your risk for a seizure. Do not drive or use heavy machinery until you know how this medicine affects you. This medicine can impair your ability to perform these tasks. Do not take this medicine close to bedtime. It may prevent you from sleeping. Your mouth may get dry. Chewing sugarless gum or sucking hard candy, and drinking plenty of water may help. Contact your doctor if the problem does not go away or is severe. The tablet shell for some brands of this medicine does not dissolve. This is normal. The tablet shell may appear whole in the stool. This is not a cause for concern. What side effects may I notice from receiving this medicine? Side effects that you should report to your doctor or health care professional as soon as possible: -allergic reactions like skin rash, itching or hives, swelling of the face, lips, or tongue -breathing problems -changes in vision -confusion -fast or irregular heartbeat -hallucinations -increased blood pressure -redness, blistering, peeling or loosening of the skin, including inside the mouth -seizures -suicidal thoughts or other mood changes -unusually weak or tired -vomiting Side effects that usually do not require medical attention (report to your  doctor or health care professional if they continue or are bothersome): -change in sex drive or performance -constipation -headache -loss of appetite -nausea -tremors -weight loss This list may not describe all possible side effects. Call your doctor for medical advice about side effects. You may report side effects to FDA at 1-800-FDA-1088. Where should I keep my medicine? Keep out of the reach of children. Store at room temperature between 15 and 30 degrees C (59 and 86 degrees F). Throw away any unused medicine after the expiration date. NOTE: This sheet is a summary. It may not cover all possible information. If you have questions about this medicine, talk to your doctor, pharmacist, or health care provider.  2015, [employer]. (2013-07-08 12:39:42)  

## 2015-06-14 NOTE — Progress Notes (Signed)
Patient ID: Spencer Love, male   DOB: 07-10-1986, 29 y.o.   MRN: [id number]  Assessment and Plan:  Hypertension:  -Continue medication,  -monitor blood pressure at home.  -Continue DASH diet.   -Reminder to go to the ER if any CP, SOB, nausea, dizziness, severe HA, changes vision/speech, left arm numbness and tingling, and jaw pain.  Cholesterol: -Continue diet and exercise.  -Check cholesterol.   Pre-diabetes: -Continue diet and exercise.  -Check A1C  Vitamin D Def: -check level -continue medications.   Depression -wellbutrin -cont celexa  Tobacco Abuse -wellbutrin to try to quit smoking   Continue diet and meds as discussed. Further disposition pending results of labs.  HPI 29 y.o. male  presents for 3 month follow up with hypertension, hyperlipidemia, prediabetes and vitamin D.   His blood pressure has been controlled at home, today their BP is BP: 124/62 mmHg.   He does workout.  He does some cardio and going to the gym a couple times a week.  He walks a lot at his job as well.   He denies chest pain, shortness of breath, dizziness.   He is not on cholesterol medication and denies myalgias. His cholesterol is not at goal. The cholesterol last visit was:   Lab Results  Component Value Date   CHOL 162 03/09/2015   HDL 21* 03/09/2015   LDLCALC 96 03/09/2015   TRIG 223* 03/09/2015   CHOLHDL 7.7 03/09/2015     He has been working on diet and exercise for prediabetes, and denies foot ulcerations, hyperglycemia, hypoglycemia , increased appetite, nausea, paresthesia of the feet, polydipsia, polyuria, visual disturbances, vomiting and weight loss. Last A1C in the office was:  Lab Results  Component Value Date   HGBA1C 5.1 03/09/2015    Patient is on Vitamin D supplement.  Lab Results  Component Value Date   VD25OH 69 03/09/2015      Patient reports that he has been trying to quit smoking but it is tearing up his nerves and he is getting very anxious and  depressed.  He reports that he has been very tired as well.  He would like to try a medicaton to help him quit smoking.    Current Medications:  Current Outpatient Prescriptions on File Prior to Visit  Medication Sig Dispense Refill  . aspirin EC 81 MG tablet Take 81 mg by mouth 2 (two) times daily.    . citalopram (CELEXA) 40 MG tablet TAKE ONE TABLET BY MOUTH ONE TIME DAILY 30 tablet 0  . cloNIDine (CATAPRES) 0.2 MG tablet TAKE ONE HALF TO ONE TABLET BY MOUTH THREE TIMES DAILY for blood pressure. 90 tablet 5  . fish oil-omega-3 fatty acids 1000 MG capsule Take 1 g by mouth 2 (two) times daily.    . Flaxseed, Linseed, (FLAX SEED OIL PO) Take 1 tablet by mouth 2 (two) times daily.    . hydrochlorothiazide (HYDRODIURIL) 25 MG tablet TAKE ONE TABLET BY MOUTH ONE TIME DAILY 90 tablet 1  . lisinopril (PRINIVIL,ZESTRIL) 20 MG tablet Take 1 tablet (20 mg total) by mouth 2 (two) times daily. 180 tablet 1  . LORazepam (ATIVAN) 1 MG tablet TAKE ONE HALF TO ONE TABLET BY MOUTH THREE TIMES DAILY AS NEEDED 90 tablet 0  . metFORMIN (GLUCOPHAGE) 500 MG tablet TAKE ONE TABLET BY MOUTH TWICE DAILY 180 tablet 1  . omeprazole (PRILOSEC) 20 MG capsule Take 20 mg by mouth daily. PRN    . podophyllum resin (PODOCON-25) 25 % SOLN Apply  sparingly for 30-45 min once weekly 15 mL 1  . propranolol (INDERAL) 80 MG tablet TAKE ONE TABLET BY MOUTH THREE TIMES DAILY  270 tablet 1   No current facility-administered medications on file prior to visit.    Medical History:  Past Medical History  Diagnosis Date  . Hidradenitis suppurativa   . Hypertension   . Depression   . Anxiety   . Prediabetes     Allergies:  Allergies  Allergen Reactions  . Peanut-Containing Drug Products Anaphylaxis  . Ceclor [Cefaclor] Hives and Swelling  . Sulfa Antibiotics Hives     Review of Systems:  Review of Systems  Constitutional: Positive for malaise/fatigue. Negative for fever, chills and weight loss.  HENT: Negative for  congestion, ear pain and sore throat.   Eyes: Negative.   Respiratory: Negative for cough, shortness of breath and wheezing.   Cardiovascular: Negative for chest pain, palpitations and leg swelling.  Gastrointestinal: Negative for heartburn, nausea, vomiting, diarrhea, constipation, blood in stool and melena.  Genitourinary: Negative.   Skin: Negative.   Neurological: Negative for dizziness, tingling, sensory change and headaches.  Psychiatric/Behavioral: Positive for depression. The patient is nervous/anxious. The patient does not have insomnia.     Family history- Review and unchanged  Social history- Review and unchanged  Physical Exam: BP 124/62 mmHg  Pulse 86  Temp(Src) 98.2 F (36.8 C) (Temporal)  Resp 18  Ht  (1.93 m)  Wt 275 lb (124.739 kg)  BMI 33.49 kg/m2 Wt Readings from Last 3 Encounters:  06/14/15 275 lb (124.739 kg)  03/09/15 275 lb (124.739 kg)  01/09/15 282 lb 1.6 oz (127.96 kg)    General Appearance: Well nourished well developed, in no apparent distress. Eyes: PERRLA, EOMs, conjunctiva no swelling or erythema ENT/Mouth: Ear canals normal without obstruction, swelling, erythma, discharge.  TMs normal bilaterally.  Oropharynx moist, clear, without exudate, or postoropharyngeal swelling. Neck: Supple, thyroid normal,no cervical adenopathy  Respiratory: Respiratory effort normal, Breath sounds clear A&P without rhonchi, wheeze, or rale.  No retractions, no accessory usage. Cardio: RRR with no MRGs. Brisk peripheral pulses without edema.  Abdomen: Soft, + BS,  Non tender, no guarding, rebound, hernias, masses. Musculoskeletal: Full ROM, 5/5 strength, Normal gait Skin: Warm, dry without rashes, lesions, ecchymosis.  Neuro: Awake and oriented X 3, Cranial nerves intact. Normal muscle tone, no cerebellar symptoms. Psych: Normal affect, Insight and Judgment appropriate.    FORCUCCI, Jaclyne Haverstick, PA-C 8:48 AM [facility]

## 2015-06-15 LAB — VITAMIN D 25 HYDROXY (VIT D DEFICIENCY, FRACTURES): Vit D, 25-Hydroxy: 55 ng/mL (ref 30–100)

## 2015-06-15 LAB — INSULIN, RANDOM: INSULIN: 42.8 u[IU]/mL — AB (ref 2.0–19.6)

## 2015-07-09 ENCOUNTER — Other Ambulatory Visit: Payer: Self-pay | Admitting: Internal Medicine

## 2015-07-13 ENCOUNTER — Other Ambulatory Visit: Payer: Self-pay | Admitting: Internal Medicine

## 2015-07-14 ENCOUNTER — Other Ambulatory Visit: Payer: Self-pay | Admitting: Physician Assistant

## 2015-07-14 DIAGNOSIS — F411 Generalized anxiety disorder: Secondary | ICD-10-CM

## 2015-07-16 ENCOUNTER — Other Ambulatory Visit: Payer: Self-pay | Admitting: Physician Assistant

## 2015-07-16 DIAGNOSIS — F419 Anxiety disorder, unspecified: Secondary | ICD-10-CM

## 2015-07-24 ENCOUNTER — Other Ambulatory Visit: Payer: Self-pay | Admitting: *Deleted

## 2015-07-24 MED ORDER — PROPRANOLOL HCL 80 MG PO TABS: 80.0000 mg | ORAL_TABLET | Freq: Three times a day (TID) | ORAL | Status: DC

## 2015-08-10 ENCOUNTER — Other Ambulatory Visit: Payer: Self-pay

## 2015-08-10 MED ORDER — CITALOPRAM HYDROBROMIDE 40 MG PO TABS: 40.0000 mg | ORAL_TABLET | Freq: Every day | ORAL | Status: DC

## 2015-08-15 ENCOUNTER — Other Ambulatory Visit: Payer: Self-pay | Admitting: Internal Medicine

## 2015-08-16 ENCOUNTER — Other Ambulatory Visit: Payer: Self-pay | Admitting: Internal Medicine

## 2015-08-20 ENCOUNTER — Other Ambulatory Visit: Payer: Self-pay | Admitting: *Deleted

## 2015-08-20 MED ORDER — CLONIDINE HCL 0.2 MG PO TABS: ORAL_TABLET | ORAL | Status: DC

## 2015-09-08 ENCOUNTER — Other Ambulatory Visit: Payer: Self-pay | Admitting: Internal Medicine

## 2015-09-11 ENCOUNTER — Telehealth: Payer: Self-pay | Admitting: Pulmonary Disease

## 2015-09-11 DIAGNOSIS — G4733 Obstructive sleep apnea (adult) (pediatric): Secondary | ICD-10-CM

## 2015-09-11 NOTE — Telephone Encounter (Signed)
lmtcb x1 

## 2015-09-12 ENCOUNTER — Encounter: Payer: Self-pay | Admitting: Internal Medicine

## 2015-09-12 NOTE — Telephone Encounter (Signed)
ATC pt. Unable to leave a VM. WCB.

## 2015-09-13 ENCOUNTER — Telehealth: Payer: Self-pay | Admitting: Pulmonary Disease

## 2015-09-13 NOTE — Telephone Encounter (Signed)
Called and spoke with Larita Fife from [facility] Larita Fife stated needing last OV note and sleep study results to be able to fill order for pt to get new CPAP supplies Informed Larita Fife that pt is an old [facility] pt and has not established with Dr Vassie Loll yet Last OV note from [facility] and sleep study results were faxed to [phone] Received confirmation sheet  Nothing further is needed

## 2015-09-13 NOTE — Telephone Encounter (Signed)
OK to provide. 

## 2015-09-13 NOTE — Telephone Encounter (Signed)
Order has been placed. Pt aware and nothing further needed

## 2015-09-13 NOTE — Telephone Encounter (Signed)
ATC PT NA. Pt last saw [facility] 10/03/14 and has pending appt with RA in [place] 11-01-15. Pt is requesting order for new CPAP mask and tubing. Please advise RA if okay to order? thanks

## 2015-10-11 ENCOUNTER — Encounter: Payer: Self-pay | Admitting: Internal Medicine

## 2015-10-11 ENCOUNTER — Other Ambulatory Visit: Payer: Self-pay | Admitting: Internal Medicine

## 2015-10-11 ENCOUNTER — Ambulatory Visit (INDEPENDENT_AMBULATORY_CARE_PROVIDER_SITE_OTHER): Payer: BLUE CROSS/BLUE SHIELD | Admitting: Internal Medicine

## 2015-10-11 VITALS — BP 116/64 | HR 72 | Temp 97.8°F | Resp 16 | Ht 76.75 in | Wt 295.0 lb

## 2015-10-11 DIAGNOSIS — Z23 Encounter for immunization: Secondary | ICD-10-CM | POA: Diagnosis not present

## 2015-10-11 DIAGNOSIS — Z6833 Body mass index (BMI) 33.0-33.9, adult: Secondary | ICD-10-CM

## 2015-10-11 DIAGNOSIS — I10 Essential (primary) hypertension: Secondary | ICD-10-CM | POA: Diagnosis not present

## 2015-10-11 DIAGNOSIS — Z0000 Encounter for general adult medical examination without abnormal findings: Secondary | ICD-10-CM | POA: Diagnosis not present

## 2015-10-11 DIAGNOSIS — Z79899 Other long term (current) drug therapy: Secondary | ICD-10-CM

## 2015-10-11 DIAGNOSIS — Z0001 Encounter for general adult medical examination with abnormal findings: Secondary | ICD-10-CM

## 2015-10-11 DIAGNOSIS — E782 Mixed hyperlipidemia: Secondary | ICD-10-CM

## 2015-10-11 DIAGNOSIS — R5383 Other fatigue: Secondary | ICD-10-CM

## 2015-10-11 DIAGNOSIS — E669 Obesity, unspecified: Secondary | ICD-10-CM | POA: Insufficient documentation

## 2015-10-11 DIAGNOSIS — K219 Gastro-esophageal reflux disease without esophagitis: Secondary | ICD-10-CM

## 2015-10-11 DIAGNOSIS — E559 Vitamin D deficiency, unspecified: Secondary | ICD-10-CM | POA: Diagnosis not present

## 2015-10-11 DIAGNOSIS — Z111 Encounter for screening for respiratory tuberculosis: Secondary | ICD-10-CM | POA: Diagnosis not present

## 2015-10-11 DIAGNOSIS — F32A Depression, unspecified: Secondary | ICD-10-CM

## 2015-10-11 DIAGNOSIS — Z1212 Encounter for screening for malignant neoplasm of rectum: Secondary | ICD-10-CM

## 2015-10-11 DIAGNOSIS — G4733 Obstructive sleep apnea (adult) (pediatric): Secondary | ICD-10-CM

## 2015-10-11 DIAGNOSIS — R7303 Prediabetes: Secondary | ICD-10-CM

## 2015-10-11 DIAGNOSIS — F329 Major depressive disorder, single episode, unspecified: Secondary | ICD-10-CM

## 2015-10-11 LAB — LIPID PANEL
CHOL/HDL RATIO: 7.6 ratio — AB (ref <=5.0)
CHOLESTEROL: 175 mg/dL (ref 125–200)
HDL: 23 mg/dL — AB (ref >=40)
LDL Cholesterol: 98 mg/dL (ref <130)
TRIGLYCERIDES: 271 mg/dL — AB (ref <150)
VLDL: 54 mg/dL — AB (ref <30)

## 2015-10-11 LAB — BASIC METABOLIC PANEL WITH GFR
BUN: 18 mg/dL (ref 7–25)
CALCIUM: 9.2 mg/dL (ref 8.6–10.3)
CO2: 24 mmol/L (ref 20–31)
Chloride: 103 mmol/L (ref 98–110)
Creat: 0.96 mg/dL (ref 0.60–1.35)
Glucose, Bld: 76 mg/dL (ref 65–99)
POTASSIUM: 4.1 mmol/L (ref 3.5–5.3)
SODIUM: 138 mmol/L (ref 135–146)

## 2015-10-11 LAB — HEPATIC FUNCTION PANEL
ALK PHOS: 42 U/L (ref 40–115)
ALT: 23 U/L (ref 9–46)
AST: 18 U/L (ref 10–40)
Albumin: 4.4 g/dL (ref 3.6–5.1)
BILIRUBIN DIRECT: 0.1 mg/dL (ref <=0.2)
BILIRUBIN TOTAL: 0.4 mg/dL (ref 0.2–1.2)
Indirect Bilirubin: 0.3 mg/dL (ref 0.2–1.2)
TOTAL PROTEIN: 6.4 g/dL (ref 6.1–8.1)

## 2015-10-11 LAB — HEMOGLOBIN A1C
Hgb A1c MFr Bld: 5.3 % (ref <5.7)
MEAN PLASMA GLUCOSE: 105 mg/dL (ref <117)

## 2015-10-11 LAB — CBC WITH DIFFERENTIAL/PLATELET
BASOS PCT: 0 % (ref 0–1)
Basophils Absolute: 0 10*3/uL (ref 0.0–0.1)
EOS PCT: 2 % (ref 0–5)
Eosinophils Absolute: 0.1 10*3/uL (ref 0.0–0.7)
HCT: 39.6 % (ref 39.0–52.0)
Hemoglobin: 13.8 g/dL (ref 13.0–17.0)
LYMPHS PCT: 32 % (ref 12–46)
Lymphs Abs: 2.2 10*3/uL (ref 0.7–4.0)
MCH: 30.3 pg (ref 26.0–34.0)
MCHC: 34.8 g/dL (ref 30.0–36.0)
MCV: 87 fL (ref 78.0–100.0)
MONO ABS: 0.4 10*3/uL (ref 0.1–1.0)
MPV: 10.6 fL (ref 8.6–12.4)
Monocytes Relative: 5 % (ref 3–12)
NEUTROS PCT: 61 % (ref 43–77)
Neutro Abs: 4.3 10*3/uL (ref 1.7–7.7)
PLATELETS: 187 10*3/uL (ref 150–400)
RBC: 4.55 MIL/uL (ref 4.22–5.81)
RDW: 13.9 % (ref 11.5–15.5)
WBC: 7 10*3/uL (ref 4.0–10.5)

## 2015-10-11 LAB — VITAMIN B12: VITAMIN B 12: 466 pg/mL (ref 211–911)

## 2015-10-11 LAB — IRON AND TIBC
%SAT: 27 % (ref 15–60)
IRON: 87 ug/dL (ref 50–195)
TIBC: 321 ug/dL (ref 250–425)
UIBC: 234 ug/dL (ref 125–400)

## 2015-10-11 LAB — MAGNESIUM: Magnesium: 1.8 mg/dL (ref 1.5–2.5)

## 2015-10-11 LAB — TSH: TSH: 1.104 u[IU]/mL (ref 0.350–4.500)

## 2015-10-11 MED ORDER — BUPROPION HCL ER (XL) 300 MG PO TB24: ORAL_TABLET | ORAL | Status: DC

## 2015-10-11 NOTE — Patient Instructions (Signed)
Recommend Adult Low Dose Aspirin or   coated  Aspirin 81 mg daily   To reduce risk of Colon Cancer 20 %,   Skin Cancer 26 % ,   Melanoma 46%   and   Pancreatic cancer 60%   ++++++++++++++++++++++++++++++++++++++++++++++++++++++  Vitamin D goal   is between 70-100.   Please make sure that you are taking your Vitamin D as directed.   It is very important as a natural anti-inflammatory   helping hair, skin, and nails, as well as reducing stroke and heart attack risk.   It helps your bones and helps with mood.  It also decreases numerous cancer risks so please take it as directed.   Low Vit D is associated with a 200-300% higher risk for CANCER   and 200-300% higher risk for HEART   ATTACK  &  STROKE.   ......................................  It is also associated with higher death rate at younger ages,   autoimmune diseases like Rheumatoid arthritis, Lupus, Multiple Sclerosis.     Also many other serious conditions, like depression, Alzheimer's  Dementia, infertility, muscle aches, fatigue, fibromyalgia - just to name a few.  ++++++++++++++++++++++++++++++++++++++++++++++++  Recommend the book "The END of DIETING" by Dr Joel Fuhrman   & the book "The END of DIABETES " by Dr Joel Fuhrman  At [employer] - get book & Audio CD's     Being diabetic has a  300% increased risk for heart attack, stroke, cancer, and alzheimer- type vascular dementia. It is very important that you work harder with diet by avoiding all foods that are white. Avoid white rice (brown & wild rice is OK), white potatoes (sweetpotatoes in moderation is OK), White bread or wheat bread or anything made out of white flour like bagels, donuts, rolls, buns, biscuits, cakes, pastries, cookies, pizza crust, and pasta (made from white flour & egg whites) - vegetarian pasta or spinach or wheat pasta is OK. Multigrain breads like Arnold's or [employer], or multigrain sandwich thins or flatbreads.  Diet,  exercise and weight loss can reverse and cure diabetes in the early stages.  Diet, exercise and weight loss is very important in the control and prevention of complications of diabetes which affects every system in your body, ie. Brain - dementia/stroke, eyes - glaucoma/blindness, heart - heart attack/heart failure, kidneys - dialysis, stomach - gastric paralysis, intestines - malabsorption, nerves - severe painful neuritis, circulation - gangrene & loss of a leg(s), and finally cancer and Alzheimers.    I recommend avoid fried & greasy foods,  sweets/candy, white rice (brown or wild rice or Quinoa is OK), white potatoes (sweet potatoes are OK) - anything made from white flour - bagels, doughnuts, rolls, buns, biscuits,white and wheat breads, pizza crust and traditional pasta made of white flour & egg white(vegetarian pasta or spinach or wheat pasta is OK).  Multi-grain bread is OK - like multi-grain flat bread or sandwich thins. Avoid alcohol in excess. Exercise is also important.    Eat all the vegetables you want - avoid meat, especially red meat and dairy - especially cheese.  Cheese is the most concentrated form of trans-fats which is the worst thing to clog up our arteries. Veggie cheese is OK which can be found in the fresh produce section at [employer] or [employer] or [employer]  ++++++++++++++++++++++++++++++++++++++++++++++++++ DASH Eating Plan  DASH stands for "Dietary Approaches to Stop Hypertension."   The DASH eating plan is a healthy eating plan that has been shown to reduce high   blood pressure (hypertension). Additional health benefits may include reducing the risk of type 2 diabetes mellitus, heart disease, and stroke. The DASH eating plan may also help with weight loss.  WHAT DO I NEED TO KNOW ABOUT THE DASH EATING PLAN? For the DASH eating plan, you will follow these general guidelines:  Choose foods with a percent daily value for sodium of less than 5% (as listed on the food  label).  Use salt-free seasonings or herbs instead of table salt or sea salt.  Check with your health care provider or pharmacist before using salt substitutes.  Eat lower-sodium products, often labeled as "lower sodium" or "no salt added."  Eat fresh foods.  Eat more vegetables, fruits, and low-fat dairy products.    Choose whole grains. Look for the word "whole" as the first word in the ingredient list.  Choose fish   Limit sweets, desserts, sugars, and sugary drinks.  Choose heart-healthy fats.  Eat veggie cheese   Eat more home-cooked food and less restaurant, buffet, and fast food.  Limit fried foods.  Cook foods using methods other than frying.  Limit canned vegetables. If you do use them, rinse them well to decrease the sodium.  When eating at a restaurant, ask that your food be prepared with less salt, or no salt if possible.                      WHAT FOODS CAN I EAT?  Seek help from a dietitian for individual calorie needs. Grains Whole grain or whole wheat bread. Brown rice. Whole grain or whole wheat pasta. Quinoa, bulgur, and whole grain cereals. Low-sodium cereals. Corn or whole wheat flour tortillas. Whole grain cornbread. Whole grain crackers. Low-sodium crackers.  Vegetables Fresh or frozen vegetables (raw, steamed, roasted, or grilled). Low-sodium or reduced-sodium tomato and vegetable juices. Low-sodium or reduced-sodium tomato sauce and paste. Low-sodium or reduced-sodium canned vegetables.   Fruits All fresh, canned (in natural juice), or frozen fruits.  Meat and Other Protein Products  All fish and seafood.  Dried beans, peas, or lentils. Unsalted nuts and seeds. Unsalted canned beans. Dairy Low-fat dairy products, such as skim or 1% milk, 2% or reduced-fat cheeses, low-fat ricotta or cottage cheese, or plain low-fat yogurt. Low-sodium or reduced-sodium cheeses.  Fats and Oils Tub margarines without trans fats. Light or reduced-fat mayonnaise  and salad dressings (reduced sodium). Avocado. Safflower, olive, or canola oils. Natural peanut or almond butter.  Other Unsalted popcorn and pretzels. The items listed above may not be a complete list of recommended foods or beverages. Contact your dietitian for more options.  +++++++++++++++++++++++++++++++++++++++++++  WHAT FOODS ARE NOT RECOMMENDED?  Grains/ White flour or wheat flour  White bread. White pasta. White rice. Refined cornbread. Bagels and croissants. Crackers that contain trans fat.  Vegetables  Creamed or fried vegetables. Vegetables in a . Regular canned vegetables. Regular canned tomato sauce and paste. Regular tomato and vegetable juices.  Fruits Dried fruits. Canned fruit in light or heavy syrup. Fruit juice.  Meat and Other Protein Products Meat in general. Fatty cuts of meat. Ribs, chicken wings, bacon, sausage, bologna, salami, chitterlings, fatback, hot dogs, bratwurst, and packaged luncheon meats. Salted nuts and seeds. Canned beans with salt.  Dairy Whole or 2% milk, cream, half-and-half, and cream cheese. Whole-fat or sweetened yogurt. Full-fat cheeses or blue cheese. Nondairy creamers and whipped toppings. Processed cheese, cheese spreads, or cheese curds.  Condiments Onion and garlic salt, seasoned salt, table salt, and sea  salt. Canned and packaged gravies. Worcestershire sauce. Tartar sauce. Barbecue sauce. Teriyaki sauce. Soy sauce, including reduced sodium. Steak sauce. Fish sauce. Oyster sauce. Cocktail sauce. Horseradish. Ketchup and mustard. Meat flavorings and tenderizers. Bouillon cubes. Hot sauce. Tabasco sauce. Marinades. Taco seasonings. Relishes.  Fats and Oils Butter, stick margarine, lard, shortening, ghee, and bacon fat. Coconut, palm kernel, or palm oils. Regular salad dressings.  Pickles and olives. Salted popcorn and pretzels. The items listed above may not be a complete list of foods and beverages to avoid.   Preventive Care for  Adults  A healthy lifestyle and preventive care can promote health and wellness. Preventive health guidelines for men include the following key practices:  A routine yearly physical is a good way to check with your health care provider about your health and preventative screening. It is a chance to share any concerns and updates on your health and to receive a thorough exam.  Visit your dentist for a routine exam and preventative care every 6 months. Brush your teeth twice a day and floss once a day. Good oral hygiene prevents tooth decay and gum disease.  The frequency of eye exams is based on your age, health, family medical history, use of contact lenses, and other factors. Follow your health care provider's recommendations for frequency of eye exams.  Eat a healthy diet. Foods such as vegetables, fruits, whole grains, low-fat dairy products, and lean protein foods contain the nutrients you need without too many calories. Decrease your intake of foods high in solid fats, added sugars, and salt. Eat the right amount of calories for you.Get information about a proper diet from your health care provider, if necessary.  Regular physical exercise is one of the most important things you can do for your health. Most adults should get at least 150 minutes of moderate-intensity exercise (any activity that increases your heart rate and causes you to sweat) each week. In addition, most adults need muscle-strengthening exercises on 2 or more days a week.  Maintain a healthy weight. The body mass index (BMI) is a screening tool to identify possible weight problems. It provides an estimate of body fat based on height and weight. Your health care provider can find your BMI and can help you achieve or maintain a healthy weight.For adults 20 years and older:  A BMI below 18.5 is considered underweight.  A BMI of 18.5 to 24.9 is normal.  A BMI of 25 to 29.9 is considered overweight.  A BMI of 30 and above  is considered obese.  Maintain normal blood lipids and cholesterol levels by exercising and minimizing your intake of saturated fat. Eat a balanced diet with plenty of fruit and vegetables. Blood tests for lipids and cholesterol should begin at age 72 and be repeated every 5 years. If your lipid or cholesterol levels are high, you are over 50, or you are at high risk for heart disease, you may need your cholesterol levels checked more frequently.Ongoing high lipid and cholesterol levels should be treated with medicines if diet and exercise are not working.  If you smoke, find out from your health care provider how to quit. If you do not use tobacco, do not start.  Lung cancer screening is recommended for adults aged 57-80 years who are at high risk for developing lung cancer because of a history of smoking. A yearly low-dose CT scan of the lungs is recommended for people who have at least a 30-pack-year history of smoking and are a  current smoker or have quit within the past 15 years. A pack year of smoking is smoking an average of 1 pack of cigarettes a day for 1 year (for example: 1 pack a day for 30 years or 2 packs a day for 15 years). Yearly screening should continue until the smoker has stopped smoking for at least 15 years. Yearly screening should be stopped for people who develop a health problem that would prevent them from having lung cancer treatment.  If you choose to drink alcohol, do not have more than 2 drinks per day. One drink is considered to be 12 ounces (355 mL) of beer, 5 ounces (148 mL) of wine, or 1.5 ounces (44 mL) of liquor.  High blood pressure causes heart disease and increases the risk of stroke. Your blood pressure should be checked. Ongoing high blood pressure should be treated with medicines, if weight loss and exercise are not effective.  If you are 45-79 years old, ask your health care provider if you should take aspirin to prevent heart disease.  Diabetes screening  involves taking a blood sample to check your fasting blood sugar level. Testing should be considered at a younger age or be carried out more frequently if you are overweight and have at least 1 risk factor for diabetes.  Colorectal cancer can be detected and often prevented. Most routine colorectal cancer screening begins at the age of 50 and continues through age 75. However, your health care provider may recommend screening at an earlier age if you have risk factors for colon cancer. On a yearly basis, your health care provider may provide home test kits to check for hidden blood in the stool. Use of a small camera at the end of a tube to directly examine the colon (sigmoidoscopy or colonoscopy) can detect the earliest forms of colorectal cancer. Talk to your health care provider about this at age 50, when routine screening begins. Direct exam of the colon should be repeated every 5-10 years through age 75, unless early forms of precancerous polyps or small growths are found.  Screening for abdominal aortic aneurysm (AAA)  are recommended for persons over age 50 who have history of hypertensionor who are current or former smokers.  Talk with your health care provider about prostate cancer screening.  Testicular cancer screening is recommended for adult males. Screening includes self-exam, a health care provider exam, and other screening tests. Consult with your health care provider about any symptoms you have or any concerns you have about testicular cancer.  Use sunscreen. Apply sunscreen liberally and repeatedly throughout the day. You should seek shade when your shadow is shorter than you. Protect yourself by wearing long sleeves, pants, a wide-brimmed hat, and sunglasses year round, whenever you are outdoors.  Once a month, do a whole-body skin exam, using a mirror to look at the skin on your back. Tell your health care provider about new moles, moles that have irregular borders, moles that are  larger than a pencil eraser, or moles that have changed in shape or color.  Stay current with required vaccines (immunizations).  Influenza vaccine. All adults should be immunized every year.  Tetanus, diphtheria, and acellular pertussis (Td, Tdap) vaccine. An adult who has not previously received Tdap or who does not know his vaccine status should receive 1 dose of Tdap. This initial dose should be followed by tetanus and diphtheria toxoids (Td) booster doses every 10 years. Adults with an unknown or incomplete history of completing a 3-dose   immunization series with Td-containing vaccines should begin or complete a primary immunization series including a Tdap dose. Adults should receive a Td booster every 10 years.  Zoster vaccine. One dose is recommended for adults aged 30 years or older unless certain conditions are present.    Pneumococcal 13-valent conjugate (PCV13) vaccine. When indicated, a person who is uncertain of his immunization history and has no record of immunization should receive the PCV13 vaccine. An adult aged 24 years or older who has certain medical conditions and has not been previously immunized should receive 1 dose of PCV13 vaccine. This PCV13 should be followed with a dose of pneumococcal polysaccharide (PPSV23) vaccine. The PPSV23 vaccine dose should be obtained at least 8 weeks after the dose of PCV13 vaccine. An adult aged 81 years or older who has certain medical conditions and previously received 1 or more doses of PPSV23 vaccine should receive 1 dose of PCV13. The PCV13 vaccine dose should be obtained 1 or more years after the last PPSV23 vaccine dose.    Pneumococcal polysaccharide (PPSV23) vaccine. When PCV13 is also indicated, PCV13 should be obtained first. All adults aged 55 years and older should be immunized. An adult younger than age 17 years who has certain medical conditions should be immunized. Any person who resides in a nursing home or long-term care  facility should be immunized. An adult smoker should be immunized. People with an immunocompromised condition and certain other conditions should receive both PCV13 and PPSV23 vaccines. People with human immunodeficiency virus (HIV) infection should be immunized as soon as possible after diagnosis. Immunization during chemotherapy or radiation therapy should be avoided. Routine use of PPSV23 vaccine is not recommended for American Indians, [place] Natives, or people younger than 65 years unless there are medical conditions that require PPSV23 vaccine. When indicated, people who have unknown immunization and have no record of immunization should receive PPSV23 vaccine. One-time revaccination 5 years after the first dose of PPSV23 is recommended for people aged 19-64 years who have chronic kidney failure, nephrotic syndrome, asplenia, or immunocompromised conditions. People who received 1-2 doses of PPSV23 before age 17 years should receive another dose of PPSV23 vaccine at age 63 years or later if at least 5 years have passed since the previous dose. Doses of PPSV23 are not needed for people immunized with PPSV23 at or after age 81 years.  Hepatitis A vaccine. Adults who wish to be protected from this disease, have certain high-risk conditions, work with hepatitis A-infected animals, work in hepatitis A research labs, or travel to or work in countries with a high rate of hepatitis A should be immunized. Adults who were previously unvaccinated and who anticipate close contact with an international adoptee during the first 60 days after arrival in the [place] from a country with a high rate of hepatitis A should be immunized.  Hepatitis B vaccine. Adults should be immunized if they wish to be protected from this disease, have certain high-risk conditions, may be exposed to blood or other infectious body fluids, are household contacts or sex partners of hepatitis B positive people, are clients or workers in  certain care facilities, or travel to or work in countries with a high rate of hepatitis B.  Preventive Service / Frequency  Ages 39 to 26  Blood pressure check.  Lipid and cholesterol check.  Hepatitis C blood test.** / For any individual with known risks for hepatitis C.  Skin self-exam. / Monthly.  Influenza vaccine. / Every year.  Tetanus,  diphtheria, and acellular pertussis (Tdap, Td) vaccine.** / Consult your health care provider. 1 dose of Td every 10 years.  HPV vaccine. / 3 doses over 6 months, if 76 or younger.  Measles, mumps, rubella (MMR) vaccine.** / You need at least 1 dose of MMR if you were born in 1957 or later. You may also need a second dose.  Pneumococcal 13-valent conjugate (PCV13) vaccine.** / Consult your health care provider.  Pneumococcal polysaccharide (PPSV23) vaccine.** / 1 to 2 doses if you smoke cigarettes or if you have certain conditions.  Meningococcal vaccine.** / 1 dose if you are age 39 to 22 years and a Market researcher living in a residence hall, or have one of several medical conditions. You may also need additional booster doses.  Hepatitis A vaccine.** / Consult your health care provider.  Hepatitis B vaccine.** / Consult your health care provider.

## 2015-10-11 NOTE — Progress Notes (Signed)
Patient ID: Spencer Love, male   DOB: 06/15/1986, 29 y.o.   MRN: [id number]   Wellness/Preventative Visit And Comprehensive Examination,Examination & Management  This very nice 29 y.o. Single WM presents for  presents for a Wellness Visit & comprehensive evaluation and management of multiple medical co-morbidities.  Patient has been followed for HTN, Prediabetes, Hyperlipidemia, and Vitamin D Deficiency. Patient also has GERD with sx's controlled with diet & Omeprazole. Patient also has CPAP for his OSA.    HTN predates since Jan 2014. Patient's BP has been controlled at home.Today's BP: 116/64 mmHg. Patient denies any cardiac symptoms as chest pain, palpitations, shortness of breath, dizziness or ankle swelling.   Patient's hyperlipidemia is controlled with diet and medications. Patient denies myalgias or other medication SE's. Last lipids were at goal with  Cholesterol 175; HDL 23*; LDL 98; but with elevated Triglycerides 271 on 10/11/2015.   Patient has Morbid Obesity with BMI 33.49 and consequent prediabetes with Insulin Resistance in Oct 2015 with A1c 5.2%, but elevated insulin 115 and patient has been started on Metformin to help with both his insulin resistance and obesity. He denies reactive hypoglycemic symptoms, visual blurring, diabetic polys or paresthesias.    Finally, patient has history of Vitamin D Deficiency and last vitamin D was 55 on 06/14/2015.      Medication Sig  . aspirin EC 81 MG  Take 81 mg by mouth 2 (two) times daily.  Marland Kitchen. buPROPion  XL 150  TAKE 1 TABLET BY MOUTH EVERY MORNING  . citalopram 40 MG Take 1 tablet (40 mg total) by mouth daily.  . cloNIDine  0.2 MG  TAKE ONE HALF TO ONE TABLET BY MOUTH THREE TIMES DAILY for blood pressure.  . fish oil 1000 MG  Take 1 g by mouth 2 (two) times daily.  Marland Kitchen. FLAX SEED OIL  Take 1 tablet by mouth 2 (two) times daily.  . hctz 25 MG  TAKE 1 TABLET BY MOUTH ONCE DAILY  . lisinopril  20 MG  TAKE ONE TABLET BY MOUTH TWICE DAILY  .  LORazepam  1 MG  TAKE 1/2 TO 1 TABLET BY MOUTH THREE TIMES A DAY  . metFORMIN 500 MG TAKE ONE TABLET BY MOUTH TWICE DAILY  . omeprazole 20 MG  Take 20 mg by mouth daily. PRN  . propranolol  80 MG  TAKE 1 TABLET BY MOUTH 3 TIMES A DAY   Allergies  Allergen Reactions  . Peanut-Containing Drug Products Anaphylaxis  . Ceclor [Cefaclor] Hives and Swelling  . Sulfa Antibiotics Hives   Past Medical History  Diagnosis Date  . Hidradenitis suppurativa   . Hypertension   . Depression   . Anxiety   . Prediabetes    Health Maintenance  Topic Date Due  . INFLUENZA VACCINE  07/30/2015  . TETANUS/TDAP  10/10/2025  . HIV Screening  Completed   Immunization History  Administered Date(s) Administered  . Influenza Split 10/11/2015  . Influenza,inj,Quad PF,36+ Mos 10/13/2014  . PPD Test 10/11/2015  . Tdap 10/11/2015   Past Surgical History  Procedure Laterality Date  . None     Family History  Problem Relation Age of Onset  . Heart disease Mother     Bicuspid aortic valve  . Heart disease Sister     Bicuspid aortic valve  . Colitis Sister   . Heart disease Father     Bicuspid aortic valve  . Heart disease Maternal Grandmother     Died from bypass surgery   Social  History   Social History  . Marital Status: Single    Spouse Name: N/A  . Number of Children: N/A  . Years of Education: N/A   Occupational History  . meat cutter    Social History Main Topics  . Smoking status: Current Every Day Smoker -- 0.60 packs/day for 10 years    Types: Cigarettes  . Smokeless tobacco: Not on file  . Alcohol Use: Yes     Comment: social  . Drug Use: Yes    Special: Marijuana  . Sexual Activity: Same sex   Social History Narrative   Lives alone.      ROS Constitutional: Denies fever, chills, weight loss/gain, headaches, insomnia,  night sweats or change in appetite. Does c/o fatigue. Eyes: Denies redness, blurred vision, diplopia, discharge, itchy or watery eyes.  ENT: Denies  discharge, congestion, post nasal drip, epistaxis, sore throat, earache, hearing loss, dental pain, Tinnitus, Vertigo, Sinus pain or snoring.  Cardio: Denies chest pain, palpitations, irregular heartbeat, syncope, dyspnea, diaphoresis, orthopnea, PND, claudication or edema Respiratory: denies cough, dyspnea, DOE, pleurisy, hoarseness, laryngitis or wheezing.  Gastrointestinal: Denies dysphagia, heartburn, reflux, water brash, pain, cramps, nausea, vomiting, bloating, diarrhea, constipation, hematemesis, melena, hematochezia, jaundice or hemorrhoids Genitourinary: Denies dysuria, frequency, urgency, nocturia, hesitancy, discharge, hematuria or flank pain. (+) anorgasmia. Musculoskeletal: Denies arthralgia, myalgia, stiffness, Jt. Swelling, pain, limp or strain/sprain. Denies Falls. Skin: Denies puritis, rash, hives, warts, acne, eczema or change in skin lesion Neuro: No weakness, tremor, incoordination, spasms, paresthesia or pain Psychiatric: Denies confusion, memory loss or sensory loss. Denies Depression. Endocrine: Denies change in weight, skin, hair change, nocturia, and paresthesia, diabetic polys, visual blurring or hyper / hypo glycemic episodes.  Heme/Lymph: No excessive bleeding, bruising or enlarged lymph nodes.  Physical Exam  BP 116/64 mmHg  Pulse 72  Temp(Src) 97.8 F (36.6 C)  Resp 16  Ht 6' 4.75" (1.949 m)  Wt 295 lb (133.811 kg)  BMI 35.23 kg/m2  General Appearance: Well nourished &  in no apparent distress. Eyes: PERRLA, EOMs, conjunctiva no swelling or erythema, normal fundi and vessels. Sinuses: No frontal/maxillary tenderness ENT/Mouth: EACs patent / TMs  nl. Nares clear without erythema, swelling, mucoid exudates. Oral hygiene is good. No erythema, swelling, or exudate. Tongue normal, non-obstructing. Tonsils not swollen or erythematous. Hearing normal.  Neck: Supple, thyroid normal. No bruits, nodes or JVD. Respiratory: Respiratory effort normal.  BS equal and clear  bilateral without rales, rhonci, wheezing or stridor. Cardio: Heart sounds are normal with regular rate and rhythm and no murmurs, rubs or gallops. Peripheral pulses are normal and equal bilaterally without edema. No aortic or femoral bruits. Chest: symmetric with normal excursions and percussion.  Abdomen: Flat, soft, with bowel sounds. Nontender, no guarding, rebound, hernias, masses, or organomegaly.  Lymphatics: Non tender without lymphadenopathy.  Genitourinary: No hernias.Testes nl. DRE - deferred for age. Musculoskeletal: Full ROM all peripheral extremities, joint stability, 5/5 strength, and normal gait. Skin: Warm and dry without rashes, lesions, cyanosis, clubbing or  ecchymosis.  Neuro: Cranial nerves intact, reflexes equal bilaterally. Normal muscle tone, no cerebellar symptoms. Sensation intact.  Pysch: Alert and oriented X 3 with normal affect, insight and judgment appropriate.   Assessment and Plan  1. Encounter for general adult medical examination with abnormal findings  - Microalbumin / creatinine urine ratio - EKG 12-Lead - POC Hemoccult Bld/Stl  - Vitamin B12 - Testosterone - Iron and TIBC - Urinalysis, Routine w reflex microscopic  - CBC with Differential/Platelet - BASIC METABOLIC PANEL  WITH GFR - Hepatic function panel - Magnesium - Lipid panel - TSH - Hemoglobin A1c - Insulin, random - Vit D  25 hydroxy ( - Tdap vaccine greater than or equal to 7yo IM - Flu vaccine > 3yo with preservative IM (Fluvirin Influenza Split) - PPD  2. Essential hypertension  - Microalbumin / creatinine urine ratio - EKG 12-Lead - TSH  3. Mixed hyperlipidemia  - Lipid panel  4. Prediabetes  - Hemoglobin A1c - Insulin, random  5. Vitamin D deficiency  - Vit D  25 hydroxy   6. OSA (obstructive sleep apnea)   7. Gastroesophageal reflux disease   8. Morbid obesity (BMI 33.49)  (HCC)   9. Screening for rectal cancer  - POC Hemoccult Bld/Stl   10. BMI  33.49,  adult   11. Need for prophylactic vaccination with combined diphtheria-tetanus-pertussis (DTP) vaccine  - Tdap vaccine greater than or equal to 7yo IM  12. Need for prophylactic vaccination and inoculation against influenza  - Flu vaccine > 3yo with preservative IM (Fluvirin Influenza Split)  13. Screening examination for pulmonary tuberculosis  - PPD  14. Other fatigue  - Vitamin B12 - Testosterone - Iron and TIBC - TSH  15. Medication management  - Urinalysis, Routine w reflex microscopic (not at [facility]) - CBC with Differential/Platelet - BASIC METABOLIC PANEL WITH GFR - Hepatic function panel - Magnesium   Continue prudent diet as discussed, weight control, BP monitoring, regular exercise, and medications as discussed.  Discussed med effects and SE's. Routine screening labs and tests as requested with regular follow-up as recommended.  Over 40 minutes of exam, counseling &  chart review was performed. As pt c/o anorgasmia, he was advised to taper his Celexa to 1/2 tab x 1-2 weeks and increase his Wellbutrin to 2 tabs = 300 mg with new Rx sent for same

## 2015-10-12 LAB — INSULIN, RANDOM: Insulin: 12.5 u[IU]/mL (ref 2.0–19.6)

## 2015-10-12 LAB — TESTOSTERONE: TESTOSTERONE: 278 ng/dL — AB (ref 300–890)

## 2015-10-12 LAB — VITAMIN D 25 HYDROXY (VIT D DEFICIENCY, FRACTURES): Vit D, 25-Hydroxy: 24 ng/mL — ABNORMAL LOW (ref 30–100)

## 2015-10-14 ENCOUNTER — Encounter: Payer: Self-pay | Admitting: Internal Medicine

## 2015-10-15 LAB — TB SKIN TEST
Induration: 0 mm
TB Skin Test: NEGATIVE

## 2015-10-22 ENCOUNTER — Telehealth: Payer: Self-pay | Admitting: *Deleted

## 2015-10-22 NOTE — Telephone Encounter (Signed)
Patient called and states he feels tired all the time and questioned if his low testosterone level could cause this.  Per Dr Oneta RackMcKeown, he can recheck his level in mid-November and if it is still low, he can start treatment.  Patient scheduled a lab only.

## 2015-11-01 ENCOUNTER — Ambulatory Visit: Payer: Self-pay | Admitting: Pulmonary Disease

## 2015-11-13 ENCOUNTER — Ambulatory Visit: Payer: Self-pay | Admitting: Adult Health

## 2015-11-21 ENCOUNTER — Other Ambulatory Visit: Payer: Self-pay | Admitting: Internal Medicine

## 2015-11-21 DIAGNOSIS — E119 Type 2 diabetes mellitus without complications: Secondary | ICD-10-CM

## 2015-11-21 DIAGNOSIS — F411 Generalized anxiety disorder: Secondary | ICD-10-CM

## 2015-11-26 ENCOUNTER — Other Ambulatory Visit: Payer: Self-pay | Admitting: Internal Medicine

## 2015-11-26 NOTE — Telephone Encounter (Signed)
Rx called in 

## 2015-11-27 ENCOUNTER — Other Ambulatory Visit: Payer: Managed Care, Other (non HMO)

## 2015-11-27 ENCOUNTER — Other Ambulatory Visit: Payer: Self-pay | Admitting: Internal Medicine

## 2015-11-27 DIAGNOSIS — E349 Endocrine disorder, unspecified: Secondary | ICD-10-CM

## 2015-11-28 LAB — MICROALBUMIN / CREATININE URINE RATIO
Creatinine, Urine: 230 mg/dL (ref 20–370)
MICROALB UR: 3.5 mg/dL
MICROALB/CREAT RATIO: 15 ug/mg{creat} (ref <30)

## 2015-11-28 LAB — FOLLICLE STIMULATING HORMONE: FSH: 5 m[IU]/mL (ref 1.4–18.1)

## 2015-11-28 LAB — URINALYSIS, ROUTINE W REFLEX MICROSCOPIC
Bilirubin Urine: NEGATIVE
Glucose, UA: NEGATIVE
HGB URINE DIPSTICK: NEGATIVE
NITRITE: NEGATIVE
PROTEIN: NEGATIVE
Specific Gravity, Urine: 1.029 (ref 1.001–1.035)
pH: 6 (ref 5.0–8.0)

## 2015-11-28 LAB — URINALYSIS, MICROSCOPIC ONLY
Bacteria, UA: NONE SEEN [HPF]
Casts: NONE SEEN [LPF]
Crystals: NONE SEEN [HPF]
RBC / HPF: NONE SEEN RBC/HPF (ref <=2)
SQUAMOUS EPITHELIAL / LPF: NONE SEEN [HPF] (ref <=5)
Yeast: NONE SEEN [HPF]

## 2015-11-28 LAB — LUTEINIZING HORMONE: LH: 3.8 m[IU]/mL (ref 1.5–9.3)

## 2015-11-28 LAB — PROLACTIN: Prolactin: 4.9 ng/mL (ref 2.1–17.1)

## 2015-12-03 ENCOUNTER — Other Ambulatory Visit: Payer: Self-pay | Admitting: *Deleted

## 2015-12-03 ENCOUNTER — Telehealth: Payer: Self-pay | Admitting: *Deleted

## 2015-12-03 MED ORDER — TESTOSTERONE CYPIONATE 200 MG/ML IM SOLN: INTRAMUSCULAR | Status: DC

## 2015-12-03 MED ORDER — "SYRINGE/NEEDLE (DISP) 21G X 1"" 3 ML MISC": Status: DC

## 2015-12-03 NOTE — Telephone Encounter (Signed)
Patient's RX for Testosterone injection sent in.  Patient will start with 1 1/2 ml IM every 3 weeks and check his testosterone level prior to the third visit.  Patient will need to also give a urine sample since he could not collect at his CPE.

## 2015-12-04 LAB — TESTOSTERONE, TOTAL AND FREE DIRECT MEASURE
FREE TESTOSTERONE, DIRECT MEASURE: 8 pg/mL (ref 3.8–34.2)
TESTOSTERONE: 212 ng/dL — AB (ref 300–890)

## 2015-12-05 ENCOUNTER — Telehealth: Payer: Self-pay | Admitting: *Deleted

## 2015-12-05 NOTE — Telephone Encounter (Signed)
Patient informed [facility] and appointment scheduled to begin injections.

## 2015-12-06 ENCOUNTER — Ambulatory Visit (INDEPENDENT_AMBULATORY_CARE_PROVIDER_SITE_OTHER): Payer: BLUE CROSS/BLUE SHIELD

## 2015-12-06 DIAGNOSIS — E291 Testicular hypofunction: Secondary | ICD-10-CM

## 2015-12-06 NOTE — Progress Notes (Signed)
Pt presents for instructions on how to give himself his testosterone injection. Pt was instructed on how much he needed to pull up in syringe which was 1 and 1/2 cc. Pt cleaned area on right thigh with alcohol wipe and then injected himself w/ testosterone. Pt was told to give himself another injection in 3wks and before he 3rd injection he needed to come into office and have some labs drawn. Pt voiced understand of how much to give, rotating sites of injection, cleaning area that was to be injected, and coming into office BEFORE 3rd injection. Pt was also told that if he had any questions please feel free to call the office.

## 2015-12-07 MED ORDER — TESTOSTERONE CYPIONATE 200 MG/ML IM SOLN
300.0000 mg | INTRAMUSCULAR | Status: DC
  Administered 2015-12-07: 300 mg via INTRAMUSCULAR

## 2015-12-11 ENCOUNTER — Encounter: Payer: Self-pay | Admitting: Internal Medicine

## 2015-12-17 ENCOUNTER — Encounter: Payer: Self-pay | Admitting: Adult Health

## 2015-12-17 ENCOUNTER — Ambulatory Visit (INDEPENDENT_AMBULATORY_CARE_PROVIDER_SITE_OTHER): Payer: BLUE CROSS/BLUE SHIELD | Admitting: Adult Health

## 2015-12-17 VITALS — BP 136/84 | HR 64 | Temp 97.7°F | Ht 77.0 in | Wt 290.0 lb

## 2015-12-17 DIAGNOSIS — G4733 Obstructive sleep apnea (adult) (pediatric): Secondary | ICD-10-CM | POA: Diagnosis not present

## 2015-12-17 NOTE — Assessment & Plan Note (Signed)
Wt loss  

## 2015-12-17 NOTE — Assessment & Plan Note (Signed)
Continue on C Pap at bedtime Goal is  to wear for at least 6 hours each night. Work on weight loss Do not drive if sleepy Try to quit smoking Follow-up with Dr. Craige CottaSood in  1 year and as needed

## 2015-12-17 NOTE — Progress Notes (Signed)
Subjective:    Patient ID: Spencer Love, male    DOB: 09/11/1986, 29 y.o.   MRN: [id number]  HPI 29 yo male with mild OSA followed previously by Dr. Shelle Ironlance   TEST  NPSG 04/2014 11/hr   12/17/2015 Follow up : OSA  Returns for one-year follow-up. Patient has mild sleep apnea. Pt uses CPAP 4-6 hours nightly. Mask fits fine. He would like to discuss pressure settings. Download November 16 through December seen shows good compliance with average usage around 5.5 hours. He is on AutoSet 5-15. AHI 1.3. Minimal leaks.  Feels more rested. No sign daytime sleepiness.  Still smoking, discussed smoking cessation .     Past Medical History  Diagnosis Date  . Hidradenitis suppurativa   . Hypertension   . Depression   . Anxiety   . Prediabetes    Current Outpatient Prescriptions on File Prior to Visit  Medication Sig Dispense Refill  . aspirin EC 81 MG tablet Take 81 mg by mouth 2 (two) times daily.    Marland Kitchen. buPROPion (WELLBUTRIN XL) 300 MG 24 hr tablet Take 1 tablet every morning for mood & ADD 90 tablet 1  . cloNIDine (CATAPRES) 0.2 MG tablet TAKE ONE HALF TO ONE TABLET BY MOUTH THREE TIMES DAILY for blood pressure. 90 tablet 3  . fish oil-omega-3 fatty acids 1000 MG capsule Take 1 g by mouth 2 (two) times daily.    . Flaxseed, Linseed, (FLAX SEED OIL PO) Take 1 tablet by mouth 2 (two) times daily.    . hydrochlorothiazide (HYDRODIURIL) 25 MG tablet TAKE 1 TABLET BY MOUTH ONCE DAILY 90 tablet 1  . lisinopril (PRINIVIL,ZESTRIL) 20 MG tablet TAKE ONE TABLET BY MOUTH TWICE DAILY 180 tablet 3  . LORazepam (ATIVAN) 1 MG tablet TAKE 1/2 TO 1 TABLET BY MOUTH THREE TIMES A DAY 90 tablet 5  . metFORMIN (GLUCOPHAGE) 500 MG tablet TAKE ONE TABLET BY MOUTH TWICE DAILY 180 tablet 1  . omeprazole (PRILOSEC) 20 MG capsule Take 20 mg by mouth daily. PRN    . propranolol (INDERAL) 80 MG tablet TAKE 1 TABLET BY MOUTH 3 TIMES A DAY 270 tablet 1  . SYRINGE-NEEDLE, DISP, 3 ML (BD ECLIPSE SYRINGE) 21G X 1" 3  ML MISC Inject 2 ml IM every 2 weeks. 25 each 1  . testosterone cypionate (DEPOTESTOSTERONE CYPIONATE) 200 MG/ML injection Inject 2 ml IM every 2 weeks 10 mL 1   Current Facility-Administered Medications on File Prior to Visit  Medication Dose Route Frequency Provider Last Rate Last Dose  . testosterone cypionate (DEPOTESTOSTERONE CYPIONATE) injection 300 mg  300 mg Intramuscular Q21 days Lucky CowboyWilliam McKeown, MD   300 mg at 12/07/15 1041      Review of Systems  Constitutional:   No  weight loss, night sweats,  Fevers, chills, fatigue, or  lassitude.  HEENT:   No headaches,  Difficulty swallowing,  Tooth/dental problems, or  Sore throat,                No sneezing, itching, ear ache, nasal congestion, post nasal drip,   CV:  No chest pain,  Orthopnea, PND, swelling in lower extremities, anasarca, dizziness, palpitations, syncope.   GI  No heartburn, indigestion, abdominal pain, nausea, vomiting, diarrhea, change in bowel habits, loss of appetite, bloody stools.   Resp: No shortness of breath with exertion or at rest.  No excess mucus, no productive cough,  No non-productive cough,  No coughing up of blood.  No change in color of mucus.  No wheezing.  No chest wall deformity  Skin: no rash or lesions.  GU: no dysuria, change in color of urine, no urgency or frequency.  No flank pain, no hematuria   MS:  No joint pain or swelling.  No decreased range of motion.  No back pain.  Psych:  No change in mood or affect. No depression or anxiety.  No memory loss.          Objective:   Physical Exam   Filed Vitals:   12/17/15 0925  BP: 136/84  Pulse: 64  Temp: 97.7 F (36.5 C)  TempSrc: Oral  Height:  (1.956 m)  Weight: 290 lb (131.543 kg)  SpO2: 100%    GEN: A/Ox3; pleasant , NAD, obese   HEENT:  /AT,  EACs-clear, TMs-wnl, NOSE-clear, THROAT-clear, no lesions, no postnasal drip or exudate noted. Class 2-3 MP airway   NECK:  Supple w/ fair ROM; no JVD; normal carotid  impulses w/o bruits; no thyromegaly or nodules palpated; no lymphadenopathy.  RESP  Clear  P & A; w/o, wheezes/ rales/ or rhonchi.no accessory muscle use, no dullness to percussion  CARD:  RRR, no m/r/g  , no peripheral edema, pulses intact, no cyanosis or clubbing.  GI:   Soft & nt; nml bowel sounds; no organomegaly or masses detected.  Musco: Warm bil, no deformities or joint swelling noted.   Neuro: alert, no focal deficits noted.    Skin: Warm, no lesions or rashes  '        Assessment & Plan:

## 2015-12-17 NOTE — Patient Instructions (Signed)
Continue on C Pap at bedtime Goal is  to wear for at least 6 hours each night. Work on weight loss Do not drive if sleepy Try to quit smoking Follow-up with Dr. Sood in  1 year and as needed  

## 2015-12-17 NOTE — Progress Notes (Signed)
Reviewed and agree with assessment/plan. 

## 2015-12-18 ENCOUNTER — Other Ambulatory Visit: Payer: Self-pay | Admitting: *Deleted

## 2015-12-18 DIAGNOSIS — Z1212 Encounter for screening for malignant neoplasm of rectum: Secondary | ICD-10-CM

## 2015-12-18 DIAGNOSIS — Z0001 Encounter for general adult medical examination with abnormal findings: Secondary | ICD-10-CM

## 2015-12-18 LAB — POC HEMOCCULT BLD/STL (HOME/3-CARD/SCREEN)
Card #2 Fecal Occult Blod, POC: NEGATIVE
FECAL OCCULT BLD: NEGATIVE
FECAL OCCULT BLD: NEGATIVE

## 2015-12-27 ENCOUNTER — Ambulatory Visit (HOSPITAL_COMMUNITY): Payer: BLUE CROSS/BLUE SHIELD | Attending: Cardiovascular Disease

## 2015-12-27 ENCOUNTER — Other Ambulatory Visit: Payer: Self-pay

## 2015-12-27 DIAGNOSIS — I359 Nonrheumatic aortic valve disorder, unspecified: Secondary | ICD-10-CM | POA: Diagnosis present

## 2015-12-27 DIAGNOSIS — I10 Essential (primary) hypertension: Secondary | ICD-10-CM | POA: Insufficient documentation

## 2015-12-27 DIAGNOSIS — I517 Cardiomegaly: Secondary | ICD-10-CM | POA: Insufficient documentation

## 2015-12-27 DIAGNOSIS — R7303 Prediabetes: Secondary | ICD-10-CM | POA: Diagnosis not present

## 2015-12-27 DIAGNOSIS — I351 Nonrheumatic aortic (valve) insufficiency: Secondary | ICD-10-CM

## 2016-01-05 ENCOUNTER — Other Ambulatory Visit: Payer: Self-pay | Admitting: Internal Medicine

## 2016-01-10 ENCOUNTER — Ambulatory Visit (INDEPENDENT_AMBULATORY_CARE_PROVIDER_SITE_OTHER): Payer: BLUE CROSS/BLUE SHIELD | Admitting: Cardiology

## 2016-01-10 VITALS — BP 118/74 | HR 70 | Ht 77.0 in | Wt 281.0 lb

## 2016-01-10 DIAGNOSIS — R0789 Other chest pain: Secondary | ICD-10-CM

## 2016-01-10 NOTE — Progress Notes (Signed)
HPI The patient presents for follow up of dyspnea .  He was noted on the previous evaluation to have an echo with mild to moderate aortic insufficiency.  However, I repeated this recently and this was mild. There is no mention of whether this is a bicuspid valve he does have some mild LVH and has had some difficult to control hypertension although is well controlled recently. He does have some mild chest discomfort occasionally it happens at rest. It goes away spontaneously. He does not describe associated symptoms. He doesn't have any new shortness of breath, PND or orthopnea. He's not had any new palpitations, presyncope or syncope. He carries 50 pound objects at work. He's being treated with CPAP.  Allergies  Allergen Reactions  . Peanut-Containing Drug Products Anaphylaxis  . Ceclor [Cefaclor] Hives and Swelling  . Sulfa Antibiotics Hives    Current Outpatient Prescriptions  Medication Sig Dispense Refill  . aspirin EC 81 MG tablet Take 81 mg by mouth 2 (two) times daily.    Marland Kitchen. buPROPion (WELLBUTRIN XL) 300 MG 24 hr tablet Take 1 tablet every morning for mood & ADD 90 tablet 1  . Cholecalciferol (VITAMIN D3) 5000 units CAPS Take 1 capsule by mouth 2 (two) times daily.    . cloNIDine (CATAPRES) 0.2 MG tablet TAKE ONE HALF TO ONE TABLET BY MOUTH THREE TIMES DAILY for blood pressure. 90 tablet 3  . fish oil-omega-3 fatty acids 1000 MG capsule Take 1 g by mouth 2 (two) times daily.    . Flaxseed, Linseed, (FLAX SEED OIL PO) Take 1 tablet by mouth 2 (two) times daily.    . hydrochlorothiazide (HYDRODIURIL) 25 MG tablet TAKE 1 TABLET BY MOUTH ONCE DAILY 90 tablet 1  . lisinopril (PRINIVIL,ZESTRIL) 20 MG tablet TAKE ONE TABLET BY MOUTH TWICE DAILY 180 tablet 3  . LORazepam (ATIVAN) 1 MG tablet TAKE 1/2 TO 1 TABLET BY MOUTH THREE TIMES A DAY 90 tablet 5  . metFORMIN (GLUCOPHAGE) 500 MG tablet TAKE ONE TABLET BY MOUTH TWICE DAILY 180 tablet 1  . omeprazole (PRILOSEC) 20 MG capsule Take 20 mg by  mouth daily. PRN    . propranolol (INDERAL) 80 MG tablet TAKE 1 TABLET BY MOUTH 3 TIMES A DAY 270 tablet 1  . SYRINGE-NEEDLE, DISP, 3 ML (BD ECLIPSE SYRINGE) 21G X 1" 3 ML MISC Inject 2 ml IM every 2 weeks. 25 each 1  . testosterone cypionate (DEPOTESTOSTERONE CYPIONATE) 200 MG/ML injection Inject 2 ml IM every 2 weeks 10 mL 1  . zinc gluconate 50 MG tablet Take 50 mg by mouth daily.     Current Facility-Administered Medications  Medication Dose Route Frequency Provider Last Rate Last Dose  . testosterone cypionate (DEPOTESTOSTERONE CYPIONATE) injection 300 mg  300 mg Intramuscular Q21 days Lucky CowboyWilliam McKeown, MD   300 mg at 12/07/15 1041    Past Medical History  Diagnosis Date  . Hidradenitis suppurativa   . Hypertension   . Depression   . Anxiety   . Prediabetes     Past Surgical History  Procedure Laterality Date  . None      ROS:  As stated in the HPI and negative for all other systems.  PHYSICAL EXAM BP 118/74 mmHg  Pulse 70  Ht 6\' 5"  (1.956 m)  Wt 281 lb (127.461 kg)  BMI 33.31 kg/m2 GENERAL:  Well appearing NECK:  No jugular venous distention, waveform within normal limits, carotid upstroke brisk and symmetric, no bruits, no thyromegaly LUNGS:  Clear to auscultation  bilaterally CHEST:  Unremarkable HEART:  PMI not displaced or sustained,S1 and S2 within normal limits, no S3, no S4, no clicks, no rubs, no murmurs ABD:  Flat, positive bowel sounds normal in frequency in pitch, no bruits, no rebound, no guarding, no midline pulsatile mass, no hepatomegaly, no splenomegaly EXT:  2 plus pulses throughout, no edema, no cyanosis no clubbing  EKG:  Sinus rhythm, rate 58, axis within normal limits, intervals within normal limits, no acute ST-T wave changes.  01/10/2016   ASSESSMENT AND PLAN  DYSPNEA:   His BNP was normal. AI is mild. I am going to order a POET (Plain Old Exercise Treadmill) although I think the pretest probability of obstructive coronary disease is  low.  HTN:  The blood pressure is at target. No change in medications is indicated. We will continue with therapeutic lifestyle changes (TLC).;  SLEEP APNEA:   Per Dr. Craige Cotta  TOBACCO: He understands the need to stop smoking.  We talked about this again today.    Cerebral Aneursym:  I have reviewed the literature.  There are no clear recommendations for screening patients with first degree relatives who have had a cerebral aneurysm although some physicians will do this.   The recommendation is stronger if there are more than one family member with an aneurysm.  The patient and his family are requesting screening.  I will scheduled a MRI/MRA to further assess.

## 2016-01-10 NOTE — Patient Instructions (Addendum)
Medication Instructions:  Your physician recommends that you continue on your current medications as directed. Please refer to the Current Medication list given to you today.   Labwork: none  Testing/Procedures: Your physician has requested that you have an exercise tolerance test. For further information please visit [email or web address]. Please also follow instruction sheet, as given.    Follow-Up: Your physician wants you to follow-up in: 12 months with Dr. Antoine PocheHochrein. You will receive a reminder letter in the mail two months in advance. If you don't receive a letter, please call our office to schedule the follow-up appointment.   Any Other Special Instructions Will Be Listed Below (If Applicable).     If you need a refill on your cardiac medications before your next appointment, please call your pharmacy.

## 2016-01-15 ENCOUNTER — Ambulatory Visit (INDEPENDENT_AMBULATORY_CARE_PROVIDER_SITE_OTHER): Payer: BLUE CROSS/BLUE SHIELD | Admitting: Internal Medicine

## 2016-01-15 ENCOUNTER — Encounter: Payer: Self-pay | Admitting: Internal Medicine

## 2016-01-15 VITALS — BP 130/68 | HR 84 | Temp 98.0°F | Resp 18 | Ht 76.75 in | Wt 280.0 lb

## 2016-01-15 DIAGNOSIS — E291 Testicular hypofunction: Secondary | ICD-10-CM

## 2016-01-15 DIAGNOSIS — I10 Essential (primary) hypertension: Secondary | ICD-10-CM | POA: Diagnosis not present

## 2016-01-15 DIAGNOSIS — Z79899 Other long term (current) drug therapy: Secondary | ICD-10-CM

## 2016-01-15 DIAGNOSIS — E349 Endocrine disorder, unspecified: Secondary | ICD-10-CM

## 2016-01-15 DIAGNOSIS — E782 Mixed hyperlipidemia: Secondary | ICD-10-CM | POA: Diagnosis not present

## 2016-01-15 DIAGNOSIS — E559 Vitamin D deficiency, unspecified: Secondary | ICD-10-CM

## 2016-01-15 DIAGNOSIS — R7303 Prediabetes: Secondary | ICD-10-CM | POA: Diagnosis not present

## 2016-01-15 DIAGNOSIS — M545 Low back pain: Secondary | ICD-10-CM

## 2016-01-15 LAB — CBC WITH DIFFERENTIAL/PLATELET
BASOS PCT: 1 % (ref 0–1)
Basophils Absolute: 0.1 10*3/uL (ref 0.0–0.1)
Eosinophils Absolute: 0.1 10*3/uL (ref 0.0–0.7)
Eosinophils Relative: 2 % (ref 0–5)
HEMATOCRIT: 43.1 % (ref 39.0–52.0)
HEMOGLOBIN: 15.2 g/dL (ref 13.0–17.0)
LYMPHS PCT: 33 % (ref 12–46)
Lymphs Abs: 2.2 10*3/uL (ref 0.7–4.0)
MCH: 30 pg (ref 26.0–34.0)
MCHC: 35.3 g/dL (ref 30.0–36.0)
MCV: 85.2 fL (ref 78.0–100.0)
MONO ABS: 0.5 10*3/uL (ref 0.1–1.0)
MONOS PCT: 8 % (ref 3–12)
MPV: 11.5 fL (ref 8.6–12.4)
NEUTROS ABS: 3.8 10*3/uL (ref 1.7–7.7)
NEUTROS PCT: 56 % (ref 43–77)
Platelets: 218 10*3/uL (ref 150–400)
RBC: 5.06 MIL/uL (ref 4.22–5.81)
RDW: 13.3 % (ref 11.5–15.5)
WBC: 6.8 10*3/uL (ref 4.0–10.5)

## 2016-01-15 LAB — HEPATIC FUNCTION PANEL
ALK PHOS: 47 U/L (ref 40–115)
ALT: 20 U/L (ref 9–46)
AST: 18 U/L (ref 10–40)
Albumin: 4.6 g/dL (ref 3.6–5.1)
BILIRUBIN DIRECT: 0.1 mg/dL (ref <=0.2)
BILIRUBIN INDIRECT: 0.4 mg/dL (ref 0.2–1.2)
Total Bilirubin: 0.5 mg/dL (ref 0.2–1.2)
Total Protein: 6.5 g/dL (ref 6.1–8.1)

## 2016-01-15 LAB — BASIC METABOLIC PANEL WITH GFR
BUN: 18 mg/dL (ref 7–25)
CO2: 25 mmol/L (ref 20–31)
Calcium: 9.3 mg/dL (ref 8.6–10.3)
Chloride: 101 mmol/L (ref 98–110)
Creat: 0.86 mg/dL (ref 0.60–1.35)
Glucose, Bld: 79 mg/dL (ref 65–99)
Potassium: 4 mmol/L (ref 3.5–5.3)
SODIUM: 139 mmol/L (ref 135–146)

## 2016-01-15 LAB — LIPID PANEL
Cholesterol: 179 mg/dL (ref 125–200)
HDL: 20 mg/dL — ABNORMAL LOW (ref >=40)
LDL CALC: 85 mg/dL (ref <130)
TRIGLYCERIDES: 369 mg/dL — AB (ref <150)
Total CHOL/HDL Ratio: 9 Ratio — ABNORMAL HIGH (ref <=5.0)
VLDL: 74 mg/dL — AB (ref <30)

## 2016-01-15 MED ORDER — MELOXICAM 15 MG PO TABS: 15.0000 mg | ORAL_TABLET | Freq: Every day | ORAL | Status: DC

## 2016-01-15 NOTE — Patient Instructions (Signed)
Meloxicam tablets What is this medicine? MELOXICAM (mel OX i cam) is a non-steroidal anti-inflammatory drug (NSAID). It is used to reduce swelling and to treat pain. It may be used for osteoarthritis, rheumatoid arthritis, or juvenile rheumatoid arthritis. This medicine may be used for other purposes; ask your health care provider or pharmacist if you have questions. What should I tell my health care provider before I take this medicine? They need to know if you have any of these conditions: -bleeding disorders -cigarette smoker -coronary artery bypass graft (CABG) surgery within the past 2 weeks -drink more than 3 alcohol-containing drinks per day -heart disease -high blood pressure -history of stomach bleeding -kidney disease -liver disease -lung or breathing disease, like asthma -stomach or intestine problems -an unusual or allergic reaction to meloxicam, aspirin, other NSAIDs, other medicines, foods, dyes, or preservatives -pregnant or trying to get pregnant -breast-feeding How should I use this medicine? Take this medicine by mouth with a full glass of water. Follow the directions on the prescription label. You can take it with or without food. If it upsets your stomach, take it with food. Take your medicine at regular intervals. Do not take it more often than directed. Do not stop taking except on your doctor's advice. A special MedGuide will be given to you by the pharmacist with each prescription and refill. Be sure to read this information carefully each time. Talk to your pediatrician regarding the use of this medicine in children. While this drug may be prescribed for selected conditions, precautions do apply. Patients over 65 years old may have a stronger reaction and need a smaller dose. Overdosage: If you think you have taken too much of this medicine contact a poison control center or emergency room at once. NOTE: This medicine is only for you. Do not share this medicine with  others. What if I miss a dose? If you miss a dose, take it as soon as you can. If it is almost time for your next dose, take only that dose. Do not take double or extra doses. What may interact with this medicine? Do not take this medicine with any of the following medications: -cidofovir -ketorolac This medicine may also interact with the following medications: -aspirin and aspirin-like medicines -certain medicines for blood pressure, heart disease, irregular heart beat -certain medicines for depression, anxiety, or psychotic disturbances -certain medicines that treat or prevent blood clots like warfarin, enoxaparin, dalteparin, apixaban, dabigatran, rivaroxaban -cyclosporine -digoxin -diuretics -methotrexate -other NSAIDs, medicines for pain and inflammation, like ibuprofen and naproxen -pemetrexed This list may not describe all possible interactions. Give your health care provider a list of all the medicines, herbs, non-prescription drugs, or dietary supplements you use. Also tell them if you smoke, drink alcohol, or use illegal drugs. Some items may interact with your medicine. What should I watch for while using this medicine? Tell your doctor or healthcare professional if your symptoms do not start to get better or if they get worse. Do not take other medicines that contain aspirin, ibuprofen, or naproxen with this medicine. Side effects such as stomach upset, nausea, or ulcers may be more likely to occur. Many medicines available without a prescription should not be taken with this medicine. This medicine can cause ulcers and bleeding in the stomach and intestines at any time during treatment. This can happen with no warning and may cause death. There is increased risk with taking this medicine for a long time. Smoking, drinking alcohol, older age, and poor health can   also increase risks. Call your doctor right away if you have stomach pain or blood in your vomit or stool. This medicine  does not prevent heart attack or stroke. In fact, this medicine may increase the chance of a heart attack or stroke. The chance may increase with longer use of this medicine and in people who have heart disease. If you take aspirin to prevent heart attack or stroke, talk with your doctor or health care professional. What side effects may I notice from receiving this medicine? Side effects that you should report to your doctor or health care professional as soon as possible: -allergic reactions like skin rash, itching or hives, swelling of the face, lips, or tongue -nausea, vomiting -signs and symptoms of a blood clot such as breathing problems; changes in vision; chest pain; severe, sudden headache; pain, swelling, warmth in the leg; trouble speaking; sudden numbness or weakness of the face, arm, or leg -signs and symptoms of bleeding such as bloody or black, tarry stools; red or dark-brown urine; spitting up blood or brown material that looks like coffee grounds; red spots on the skin; unusual bruising or bleeding from the eye, gums, or nose -signs and symptoms of liver injury like dark yellow or brown urine; general ill feeling or flu-like symptoms; light-colored stools; loss of appetite; nausea; right upper belly pain; unusually weak or tired; yellowing of the eyes or skin -signs and symptoms of stroke like changes in vision; confusion; trouble speaking or understanding; severe headaches; sudden numbness or weakness of the face, arm, or leg; trouble walking; dizziness; loss of balance or coordination Side effects that usually do not require medical attention (report these to your doctor or health care professional if they continue or are bothersome): -constipation -diarrhea -gas This list may not describe all possible side effects. Call your doctor for medical advice about side effects. You may report side effects to FDA at 1-800-FDA-1088. Where should I keep my medicine? Keep out of the reach of  children. Store at room temperature between 15 and 30 degrees C (59 and 86 degrees F). Throw away any unused medicine after the expiration date. NOTE: This sheet is a summary. It may not cover all possible information. If you have questions about this medicine, talk to your doctor, pharmacist, or health care provider.    2016, [employer]vier/Gold Standard. (2015-07-05 13:02:23)  

## 2016-01-15 NOTE — Progress Notes (Signed)
Patient ID: Spencer Love, male   DOB: 12-Oct-1986, 30 y.o.   MRN: [id number]  Assessment and Plan:  Hypertension:  -Continue medication,  -monitor blood pressure at home.  -Continue DASH diet.   -Reminder to go to the ER if any CP, SOB, nausea, dizziness, severe HA, changes vision/speech, left arm numbness and tingling, and jaw pain.  Cholesterol: -Continue diet and exercise.  -Check cholesterol.   Pre-diabetes: -Continue diet and exercise.  -Check A1C  Vitamin D Def: -check level -continue medications.   Back pain -no red flag symptoms -mobic -referral to Dr. Yevette Edwards  Continue diet and meds as discussed. Further disposition pending results of labs.  HPI 30 y.o. male  presents for 3 month follow up with hypertension, hyperlipidemia, prediabetes and vitamin D.   His blood pressure has been controlled at home, today their BP is BP: 130/68 mmHg.   He does workout. He denies chest pain, shortness of breath, dizziness.   He is on cholesterol medication and denies myalgias. His cholesterol is at goal. The cholesterol last visit was:   Lab Results  Component Value Date   CHOL 175 10/11/2015   HDL 23* 10/11/2015   LDLCALC 98 10/11/2015   TRIG 271* 10/11/2015   CHOLHDL 7.6* 10/11/2015     He has been working on diet and exercise for prediabetes, and denies foot ulcerations, hyperglycemia, hypoglycemia , increased appetite, nausea, paresthesia of the feet, polydipsia, polyuria, visual disturbances, vomiting and weight loss. Last A1C in the office was:  Lab Results  Component Value Date   HGBA1C 5.3 10/11/2015    Patient is on Vitamin D supplement.  Lab Results  Component Value Date   VD25OH 24* 10/11/2015     Patient reports that he has been having some low back pain which has been bothering him for the past several days.  He reports that he does a lot of heavy lifting and that it was so bad that he did have to take a hydrocodone.  He reports that the time when he is  bothered the most is early mornings when he is laying down.   Testosterone injection was last done on the 29th of December.     Current Medications:  Current Outpatient Prescriptions on File Prior to Visit  Medication Sig Dispense Refill  . aspirin EC 81 MG tablet Take 81 mg by mouth 2 (two) times daily.    Marland Kitchen buPROPion (WELLBUTRIN XL) 300 MG 24 hr tablet Take 1 tablet every morning for mood & ADD 90 tablet 1  . Cholecalciferol (VITAMIN D3) 5000 units CAPS Take 1 capsule by mouth 2 (two) times daily.    . cloNIDine (CATAPRES) 0.2 MG tablet TAKE ONE HALF TO ONE TABLET BY MOUTH THREE TIMES DAILY for blood pressure. 90 tablet 3  . fish oil-omega-3 fatty acids 1000 MG capsule Take 1 g by mouth 2 (two) times daily.    . Flaxseed, Linseed, (FLAX SEED OIL PO) Take 1 tablet by mouth 2 (two) times daily.    . hydrochlorothiazide (HYDRODIURIL) 25 MG tablet TAKE 1 TABLET BY MOUTH ONCE DAILY 90 tablet 1  . lisinopril (PRINIVIL,ZESTRIL) 20 MG tablet TAKE ONE TABLET BY MOUTH TWICE DAILY 180 tablet 3  . LORazepam (ATIVAN) 1 MG tablet TAKE 1/2 TO 1 TABLET BY MOUTH THREE TIMES A DAY 90 tablet 5  . metFORMIN (GLUCOPHAGE) 500 MG tablet TAKE ONE TABLET BY MOUTH TWICE DAILY 180 tablet 1  . omeprazole (PRILOSEC) 20 MG capsule Take 20 mg by mouth daily.  PRN    . propranolol (INDERAL) 80 MG tablet TAKE 1 TABLET BY MOUTH 3 TIMES A DAY 270 tablet 1  . SYRINGE-NEEDLE, DISP, 3 ML (BD ECLIPSE SYRINGE) 21G X 1" 3 ML MISC Inject 2 ml IM every 2 weeks. 25 each 1  . testosterone cypionate (DEPOTESTOSTERONE CYPIONATE) 200 MG/ML injection Inject 2 ml IM every 2 weeks 10 mL 1  . zinc gluconate 50 MG tablet Take 50 mg by mouth daily.     Current Facility-Administered Medications on File Prior to Visit  Medication Dose Route Frequency Provider Last Rate Last Dose  . testosterone cypionate (DEPOTESTOSTERONE CYPIONATE) injection 300 mg  300 mg Intramuscular Q21 days Lucky Cowboy, MD   300 mg at 12/07/15 1041    Medical  History:  Past Medical History  Diagnosis Date  . Hidradenitis suppurativa   . Hypertension   . Depression   . Anxiety   . Prediabetes     Allergies:  Allergies  Allergen Reactions  . Peanut-Containing Drug Products Anaphylaxis  . Ceclor [Cefaclor] Hives and Swelling  . Sulfa Antibiotics Hives     Review of Systems:  Review of Systems  Constitutional: Negative for fever, chills and malaise/fatigue.  HENT: Negative for congestion, ear pain and sore throat.   Respiratory: Positive for shortness of breath. Negative for cough and wheezing.   Cardiovascular: Positive for chest pain. Negative for palpitations and leg swelling.  Gastrointestinal: Negative for diarrhea, constipation, blood in stool and melena.  Genitourinary: Negative.   Musculoskeletal: Positive for back pain.  Skin: Negative.   Neurological: Negative for dizziness, loss of consciousness and headaches.  Psychiatric/Behavioral: The patient is nervous/anxious and has insomnia (related to back pain).     Family history- Review and unchanged  Social history- Review and unchanged  Physical Exam: BP 130/68 mmHg  Pulse 84  Temp(Src) 98 F (36.7 C) (Temporal)  Resp 18  Ht 6' 4.75" (1.949 m)  Wt 280 lb (127.007 kg)  BMI 33.44 kg/m2 Wt Readings from Last 3 Encounters:  01/15/16 280 lb (127.007 kg)  01/10/16 281 lb (127.461 kg)  12/17/15 290 lb (131.543 kg)    General Appearance: Well nourished well developed, in no apparent distress. Eyes: PERRLA, EOMs, conjunctiva no swelling or erythema ENT/Mouth: Ear canals normal without obstruction, swelling, erythma, discharge.  TMs normal bilaterally.  Oropharynx moist, clear, without exudate, or postoropharyngeal swelling. Neck: Supple, thyroid normal,no cervical adenopathy  Respiratory: Respiratory effort normal, Breath sounds clear A&P without rhonchi, wheeze, or rale.  No retractions, no accessory usage. Cardio: RRR with no MRGs. Brisk peripheral pulses without  edema.  Abdomen: Soft, + BS,  Non tender, no guarding, rebound, hernias, masses. Musculoskeletal: Full ROM, 5/5 strength, Normal gait Skin: Warm, dry without rashes, lesions, ecchymosis.  Neuro: Awake and oriented X 3, Cranial nerves intact. Normal muscle tone, no cerebellar symptoms. Psych: Normal affect, Insight and Judgment appropriate.    Terri Piedra, PA-C 3:17 PM [facility]

## 2016-01-16 LAB — TSH: TSH: 0.96 u[IU]/mL (ref 0.350–4.500)

## 2016-01-16 LAB — HEMOGLOBIN A1C
Hgb A1c MFr Bld: 5.2 % (ref <5.7)
Mean Plasma Glucose: 103 mg/dL (ref <117)

## 2016-01-16 LAB — TESTOSTERONE: TESTOSTERONE: 207 ng/dL — AB (ref 250–827)

## 2016-01-17 ENCOUNTER — Ambulatory Visit: Payer: Self-pay | Admitting: Internal Medicine

## 2016-01-20 ENCOUNTER — Encounter: Payer: Self-pay | Admitting: Cardiology

## 2016-01-24 ENCOUNTER — Encounter: Payer: Self-pay | Admitting: Adult Health

## 2016-01-25 ENCOUNTER — Other Ambulatory Visit: Payer: Self-pay | Admitting: *Deleted

## 2016-01-25 DIAGNOSIS — I10 Essential (primary) hypertension: Secondary | ICD-10-CM

## 2016-01-25 DIAGNOSIS — Z8249 Family history of ischemic heart disease and other diseases of the circulatory system: Secondary | ICD-10-CM

## 2016-01-25 DIAGNOSIS — F17200 Nicotine dependence, unspecified, uncomplicated: Secondary | ICD-10-CM

## 2016-01-29 ENCOUNTER — Telehealth (HOSPITAL_COMMUNITY): Payer: Self-pay

## 2016-01-29 NOTE — Telephone Encounter (Signed)
Encounter complete. 

## 2016-01-31 ENCOUNTER — Ambulatory Visit (HOSPITAL_COMMUNITY)
Admission: RE | Admit: 2016-01-31 | Discharge: 2016-01-31 | Disposition: A | Discharge status: Home / Self Care | Payer: Managed Care, Other (non HMO) | Source: Ambulatory Visit | Attending: Internal Medicine | Admitting: Internal Medicine

## 2016-01-31 DIAGNOSIS — R0789 Other chest pain: Secondary | ICD-10-CM

## 2016-01-31 LAB — EXERCISE TOLERANCE TEST
CHL CUP RESTING HR STRESS: 102 {beats}/min
CHL RATE OF PERCEIVED EXERTION: 16
CSEPEW: 11.8 METS
CSEPHR: 104 %
Exercise duration (min): 10 min
Exercise duration (sec): 4 s
MPHR: 192 {beats}/min
Peak HR: 200 {beats}/min

## 2016-02-01 ENCOUNTER — Other Ambulatory Visit: Payer: Self-pay | Admitting: Internal Medicine

## 2016-02-01 ENCOUNTER — Telehealth: Payer: Self-pay | Admitting: Cardiology

## 2016-02-01 ENCOUNTER — Encounter: Payer: Self-pay | Admitting: Cardiology

## 2016-02-01 ENCOUNTER — Other Ambulatory Visit: Payer: Self-pay | Admitting: Cardiology

## 2016-02-01 DIAGNOSIS — I671 Cerebral aneurysm, nonruptured: Secondary | ICD-10-CM

## 2016-02-01 DIAGNOSIS — F17200 Nicotine dependence, unspecified, uncomplicated: Secondary | ICD-10-CM

## 2016-02-01 DIAGNOSIS — I10 Essential (primary) hypertension: Secondary | ICD-10-CM

## 2016-02-01 NOTE — Telephone Encounter (Signed)
LEFT MESSAGE TO CALL BACK-   PATIENT HAD A GXT TEST 01/31/16   RESULTS WERE NORMAL,  WILL INFORM MOTHER TO HAVE PATIENT TO FOLLOW UP WITH PRIMARY DR Spencer Love

## 2016-02-01 NOTE — Telephone Encounter (Signed)
Follow up  ° ° °Patient calling back to speak with nurse  °

## 2016-02-01 NOTE — Telephone Encounter (Signed)
Returned call to patient. Advice concerning stress test results given. Pt having midsternal "fullness". Similar to what he has been having.  Noted pt encouraged to f/u w/ Dr. Oneta Rack, who may want to change meds. O/w call for any new concerns. Stay on schedule for MRA head.  Pt verb'd understanding.

## 2016-02-01 NOTE — Telephone Encounter (Signed)
New problem    Pt stated he is having a tightness in his chest but not chest pain and want to speak to nurse. Pt stated to call his mom and she will get in touch with him.

## 2016-02-12 ENCOUNTER — Ambulatory Visit (HOSPITAL_COMMUNITY)
Admission: RE | Admit: 2016-02-12 | Discharge: 2016-02-12 | Disposition: A | Discharge status: Home / Self Care | Payer: BLUE CROSS/BLUE SHIELD | Source: Ambulatory Visit | Attending: Cardiology | Admitting: Cardiology

## 2016-02-12 ENCOUNTER — Ambulatory Visit (HOSPITAL_COMMUNITY): Admission: RE | Admit: 2016-02-12 | Payer: BLUE CROSS/BLUE SHIELD | Source: Ambulatory Visit

## 2016-02-12 DIAGNOSIS — I10 Essential (primary) hypertension: Secondary | ICD-10-CM | POA: Diagnosis not present

## 2016-02-12 DIAGNOSIS — F17200 Nicotine dependence, unspecified, uncomplicated: Secondary | ICD-10-CM | POA: Insufficient documentation

## 2016-02-12 DIAGNOSIS — Z8249 Family history of ischemic heart disease and other diseases of the circulatory system: Secondary | ICD-10-CM | POA: Diagnosis not present

## 2016-02-12 DIAGNOSIS — I671 Cerebral aneurysm, nonruptured: Secondary | ICD-10-CM

## 2016-03-24 ENCOUNTER — Other Ambulatory Visit: Payer: Self-pay

## 2016-03-24 DIAGNOSIS — E119 Type 2 diabetes mellitus without complications: Secondary | ICD-10-CM

## 2016-03-24 DIAGNOSIS — M545 Low back pain: Secondary | ICD-10-CM

## 2016-03-24 DIAGNOSIS — F329 Major depressive disorder, single episode, unspecified: Secondary | ICD-10-CM

## 2016-03-24 DIAGNOSIS — F32A Depression, unspecified: Secondary | ICD-10-CM

## 2016-03-24 MED ORDER — HYDROCHLOROTHIAZIDE 25 MG PO TABS: 25.0000 mg | ORAL_TABLET | Freq: Every day | ORAL | Status: DC

## 2016-03-24 MED ORDER — METFORMIN HCL 500 MG PO TABS: 500.0000 mg | ORAL_TABLET | Freq: Two times a day (BID) | ORAL | Status: DC

## 2016-03-24 MED ORDER — PROPRANOLOL HCL 80 MG PO TABS: 80.0000 mg | ORAL_TABLET | Freq: Three times a day (TID) | ORAL | Status: DC

## 2016-03-24 MED ORDER — BUPROPION HCL ER (XL) 300 MG PO TB24: ORAL_TABLET | ORAL | Status: DC

## 2016-03-24 MED ORDER — CLONIDINE HCL 0.2 MG PO TABS: ORAL_TABLET | ORAL | Status: DC

## 2016-03-24 MED ORDER — LISINOPRIL 20 MG PO TABS: 20.0000 mg | ORAL_TABLET | Freq: Two times a day (BID) | ORAL | Status: DC

## 2016-03-24 MED ORDER — LORAZEPAM 1 MG PO TABS: ORAL_TABLET | ORAL | Status: DC

## 2016-03-24 MED ORDER — MELOXICAM 15 MG PO TABS: 15.0000 mg | ORAL_TABLET | Freq: Every day | ORAL | Status: DC

## 2016-04-17 ENCOUNTER — Ambulatory Visit (INDEPENDENT_AMBULATORY_CARE_PROVIDER_SITE_OTHER): Payer: Managed Care, Other (non HMO) | Admitting: Internal Medicine

## 2016-04-17 ENCOUNTER — Encounter: Payer: Self-pay | Admitting: Internal Medicine

## 2016-04-17 ENCOUNTER — Ambulatory Visit: Payer: Self-pay | Admitting: Internal Medicine

## 2016-04-17 VITALS — BP 124/76 | HR 72 | Temp 97.7°F | Resp 16 | Ht 76.75 in | Wt 275.2 lb

## 2016-04-17 DIAGNOSIS — E782 Mixed hyperlipidemia: Secondary | ICD-10-CM | POA: Diagnosis not present

## 2016-04-17 DIAGNOSIS — E291 Testicular hypofunction: Secondary | ICD-10-CM | POA: Diagnosis not present

## 2016-04-17 DIAGNOSIS — Z79899 Other long term (current) drug therapy: Secondary | ICD-10-CM

## 2016-04-17 DIAGNOSIS — E559 Vitamin D deficiency, unspecified: Secondary | ICD-10-CM | POA: Diagnosis not present

## 2016-04-17 DIAGNOSIS — K219 Gastro-esophageal reflux disease without esophagitis: Secondary | ICD-10-CM

## 2016-04-17 DIAGNOSIS — E349 Endocrine disorder, unspecified: Secondary | ICD-10-CM | POA: Insufficient documentation

## 2016-04-17 DIAGNOSIS — I10 Essential (primary) hypertension: Secondary | ICD-10-CM | POA: Diagnosis not present

## 2016-04-17 DIAGNOSIS — R7303 Prediabetes: Secondary | ICD-10-CM

## 2016-04-17 LAB — CBC WITH DIFFERENTIAL/PLATELET
BASOS PCT: 0 %
Basophils Absolute: 0 cells/uL (ref 0–200)
EOS ABS: 243 {cells}/uL (ref 15–500)
EOS PCT: 3 %
HCT: 43.1 % (ref 38.5–50.0)
Hemoglobin: 14.9 g/dL (ref 13.2–17.1)
LYMPHS PCT: 33 %
Lymphs Abs: 2673 cells/uL (ref 850–3900)
MCH: 29.9 pg (ref 27.0–33.0)
MCHC: 34.6 g/dL (ref 32.0–36.0)
MCV: 86.5 fL (ref 80.0–100.0)
MONOS PCT: 6 %
MPV: 11 fL (ref 7.5–12.5)
Monocytes Absolute: 486 cells/uL (ref 200–950)
Neutro Abs: 4698 cells/uL (ref 1500–7800)
Neutrophils Relative %: 58 %
PLATELETS: 156 10*3/uL (ref 140–400)
RBC: 4.98 MIL/uL (ref 4.20–5.80)
RDW: 14.3 % (ref 11.0–15.0)
WBC: 8.1 10*3/uL (ref 3.8–10.8)

## 2016-04-17 LAB — HEPATIC FUNCTION PANEL
ALBUMIN: 4.4 g/dL (ref 3.6–5.1)
ALT: 34 U/L (ref 9–46)
AST: 22 U/L (ref 10–40)
Alkaline Phosphatase: 58 U/L (ref 40–115)
BILIRUBIN TOTAL: 0.5 mg/dL (ref 0.2–1.2)
Bilirubin, Direct: 0.1 mg/dL (ref <=0.2)
Indirect Bilirubin: 0.4 mg/dL (ref 0.2–1.2)
Total Protein: 6.3 g/dL (ref 6.1–8.1)

## 2016-04-17 LAB — BASIC METABOLIC PANEL WITH GFR
BUN: 19 mg/dL (ref 7–25)
CO2: 26 mmol/L (ref 20–31)
CREATININE: 0.91 mg/dL (ref 0.60–1.35)
Calcium: 9.3 mg/dL (ref 8.6–10.3)
Chloride: 100 mmol/L (ref 98–110)
GFR, Est Non African American: >89 mL/min (ref >=60)
Glucose, Bld: 83 mg/dL (ref 65–99)
POTASSIUM: 3.9 mmol/L (ref 3.5–5.3)
Sodium: 137 mmol/L (ref 135–146)

## 2016-04-17 LAB — LIPID PANEL
Cholesterol: 153 mg/dL (ref 125–200)
HDL: 23 mg/dL — AB (ref >=40)
LDL Cholesterol: 88 mg/dL (ref <130)
TRIGLYCERIDES: 211 mg/dL — AB (ref <150)
Total CHOL/HDL Ratio: 6.7 Ratio — ABNORMAL HIGH (ref <=5.0)
VLDL: 42 mg/dL — ABNORMAL HIGH (ref <30)

## 2016-04-17 LAB — TSH: TSH: 1.01 mIU/L (ref 0.40–4.50)

## 2016-04-17 LAB — HEMOGLOBIN A1C
Hgb A1c MFr Bld: 4.9 % (ref <5.7)
Mean Plasma Glucose: 94 mg/dL

## 2016-04-17 LAB — MAGNESIUM: MAGNESIUM: 2.2 mg/dL (ref 1.5–2.5)

## 2016-04-17 MED ORDER — OMEPRAZOLE 40 MG PO CPDR: DELAYED_RELEASE_CAPSULE | ORAL | Status: DC

## 2016-04-17 MED ORDER — GABAPENTIN 100 MG PO CAPS: ORAL_CAPSULE | ORAL | Status: DC

## 2016-04-17 NOTE — Progress Notes (Signed)
Patient ID: Spencer Love, male   DOB: [birth year], 30 y.o.   MRN: [id number]   This very nice 30 y.o. single WM presents for 3 month follow up with Hypertension, Hyperlipidemia, Pre-Diabetes and Vitamin D Deficiency. Patient is on CPAP for OSA . Also he has GERD with break-thru dyspepsia on Omeprazole 20 mg.    Patient is treated for HTN circa Jan 2014 & BP has been controlled on a 4 drug regimen. Today's BP: 124/76 mmHg. Patient has had no complaints of any cardiac type chest pain, palpitations, dyspnea/orthopnea/PND, dizziness, claudication, or dependent edema.   Hyperlipidemia is controlled with diet & supplements. Last Lipids were at goal with Cholesterol 153; HDL 23*; LDL 88; and elevated Triglycerides 211 on 04/17/2016.   Also, the patient has history of Morbid Obesity (BMI 32+) and consequent PreDiabetes with insulin resistance with A1c's 5.3%  And 5.2% and elevated insulins of 152 and 115 inb 2015 and patient wa started on Metformin for his Insulin resistance and in anticipation of assisting weight loss which seems to be helping with a 20# weight loss over the last 6 months. He has had no symptoms of reactive hypoglycemia, diabetic polys, paresthesias or visual blurring.  Last A1c was  5.2% on 01/15/2016.   Further, the patient also has history of Vitamin D Deficiency and supplements vitamin D without any suspected side-effects. Last vitamin D was 24 on 10/11/2015.  Medication Sig  . aspirin EC 81 MG tablet Take 81 mg by mouth 2 (two) times daily.  Marland Kitchen buPROPion-XL) 300 MG  Take 1 tablet every morning for mood & ADD  . VITAMIN D 5000 units CAPS Take 1 capsule by mouth 2 (two) times daily.  . cloNIDine0.2 MG tablet TAKE ONE HALF TO ONE TABLET BY MOUTH THREE TIMES DAILY for blood pressure.  . fish oil-omega-31000 MG capsule Take 1 g by mouth 2 (two) times daily.  Marland Kitchen FLAX SEED OIL  Take 1 tablet by mouth 2 (two) times daily.  . hctz 25 MG tablet Take 1 tablet (25 mg total) by mouth daily.  Marland Kitchen  lisinopril  20 MG tablet Take 1 tablet (20 mg total) by mouth 2 (two) times daily.  Marland Kitchen LORazepam  1 MG tablet TAKE 1/2 TO 1 TABLET BY MOUTH THREE TIMES A DAY  . Magnesium 400 MG TABS Take 400 mg by mouth daily.  . meloxicam (MOBIC) 15 MG tablet Take 1 tablet (15 mg total) by mouth daily.  . metFORMIN  500 MG tablet Take 1 tablet (500 mg total) by mouth 2 (two) times daily.  . propranolol  80 MG tablet Take 1 tablet (80 mg total) by mouth 3 (three) times daily.  Marland Kitchen testosterone cypionate 200 MG/ML inj Inject 2 ml IM every 2 weeks  . zinc 50 MG tablet Take 50 mg by mouth daily.  Marland Kitchen omeprazole  20 MG capsule Take 20 mg by mouth daily. PRN   Allergies  Allergen Reactions  . Peanut-Containing Drug Products Anaphylaxis  . Ceclor [Cefaclor] Hives and Swelling  . Sulfa Antibiotics Hives   PMHx:   Past Medical History  Diagnosis Date  . Hidradenitis suppurativa   . Hypertension   . Depression   . Anxiety   . Prediabetes    Immunization History  Administered Date(s) Administered  . Influenza Split 10/11/2015  . Influenza,inj,Quad PF,36+ Mos 10/13/2014  . PPD Test 10/11/2015  . Tdap 10/11/2015   Past Surgical History  Procedure Laterality Date  . None     FHx:  Reviewed / unchanged  SHx:    Reviewed / unchanged  Systems Review:  Constitutional: Denies fever, chills, wt changes, headaches, insomnia, fatigue, night sweats, change in appetite. Eyes: Denies redness, blurred vision, diplopia, discharge, itchy, watery eyes.  ENT: Denies discharge, congestion, post nasal drip, epistaxis, sore throat, earache, hearing loss, dental pain, tinnitus, vertigo, sinus pain, snoring.  CV: Denies chest pain, palpitations, irregular heartbeat, syncope, dyspnea, diaphoresis, orthopnea, PND, claudication or edema. Respiratory: denies cough, dyspnea, DOE, pleurisy, hoarseness, laryngitis, wheezing.  Gastrointestinal: Denies dysphagia, odynophagia, heartburn, reflux, water brash, abdominal pain or  cramps, nausea, vomiting, bloating, diarrhea, constipation, hematemesis, melena, hematochezia  or hemorrhoids. Genitourinary: Denies dysuria, frequency, urgency, nocturia, hesitancy, discharge, hematuria or flank pain. Musculoskeletal: Denies arthralgias, myalgias, stiffness, jt. swelling, pain, limping or strain/sprain.  Skin: Denies pruritus, rash, hives, warts, acne, eczema or change in skin lesion(s). Neuro: No weakness, tremor, incoordination, spasms, paresthesia or pain. Psychiatric: Denies confusion, memory loss or sensory loss. Endo: Denies change in weight, skin or hair change.  Heme/Lymph: No excessive bleeding, bruising or enlarged lymph nodes.  Physical Exam  BP 124/76 mmHg  Pulse 72  Temp(Src) 97.7 F (36.5 C)  Resp 16  Ht 6' 4.75" (1.949 m)  Wt 275 lb 3.2 oz (124.83 kg)  BMI 32.86 kg/m2  Appears well nourished and in no distress. Eyes: PERRLA, EOMs, conjunctiva no swelling or erythema. Sinuses: No frontal/maxillary tenderness ENT/Mouth: EAC's clear, TM's nl w/o erythema, bulging. Nares clear w/o erythema, swelling, exudates. Oropharynx clear without erythema or exudates. Oral hygiene is good. Tongue normal, non obstructing. Hearing intact.  Neck: Supple. Thyroid nl. Car 2+/2+ without bruits, nodes or JVD. Chest: Respirations nl with BS clear & equal w/o rales, rhonchi, wheezing or stridor.  Cor: Heart sounds normal w/ regular rate and rhythm without sig. murmurs, gallops, clicks, or rubs. Peripheral pulses normal and equal  without edema.  Abdomen: Soft & bowel sounds normal. Non-tender w/o guarding, rebound, hernias, masses, or organomegaly.  Lymphatics: Unremarkable.  Musculoskeletal: Full ROM all peripheral extremities, joint stability, 5/5 strength, and normal gait.  Skin: Warm, dry without exposed rashes, lesions or ecchymosis apparent.  Neuro: Cranial nerves intact, reflexes equal bilaterally. Sensory-motor testing grossly intact. Tendon reflexes grossly intact.   Pysch: Alert & oriented x 3.  Insight and judgement nl & appropriate. No ideations.  Assessment and Plan:  1. Essential hypertension  - TSH  2. Mixed hyperlipidemia  - Lipid panel  3. Prediabetes  - Hemoglobin A1c - Insulin, random  4. Vitamin D deficiency  - VITAMIN D 25 Hydroxy   5. Gastroesophageal reflux disease  - omeprazole (PRILOSEC) 40 MG capsule; Take 1 capsule at bedtime for heartburn and acid reflux  Dispense: 90 capsule; Refill: 1  6. Testosterone deficiency  - Testosterone  7. Medication management  - CBC with Differential/Platelet - BASIC METABOLIC PANEL WITH GFR - Hepatic function panel - Magnesium   Recommended regular exercise, BP monitoring, weight control, and discussed med and SE's. Recommended labs to assess and monitor clinical status. Further disposition pending results of labs. Over 30 minutes of exam, counseling, chart review was performed

## 2016-04-17 NOTE — Patient Instructions (Signed)

## 2016-04-17 NOTE — Progress Notes (Deleted)
   Subjective:    Patient ID: Spencer Love, male    DOB: 08/30/1986, 30 y.o.   MRN: [id number]  HPI    Review of Systems     Objective:   Physical Exam        Assessment & Plan:

## 2016-04-18 LAB — VITAMIN D 25 HYDROXY (VIT D DEFICIENCY, FRACTURES): VIT D 25 HYDROXY: 53 ng/mL (ref 30–100)

## 2016-04-18 LAB — TESTOSTERONE: TESTOSTERONE: 375 ng/dL (ref 250–827)

## 2016-04-18 LAB — INSULIN, RANDOM: INSULIN: 8.4 u[IU]/mL (ref 2.0–19.6)

## 2016-04-20 ENCOUNTER — Other Ambulatory Visit: Payer: Self-pay | Admitting: Internal Medicine

## 2016-04-23 ENCOUNTER — Other Ambulatory Visit: Payer: Self-pay | Admitting: Internal Medicine

## 2016-04-23 DIAGNOSIS — J45901 Unspecified asthma with (acute) exacerbation: Secondary | ICD-10-CM

## 2016-04-23 MED ORDER — AZITHROMYCIN 250 MG PO TABS: ORAL_TABLET | ORAL | Status: DC

## 2016-04-23 MED ORDER — ALBUTEROL SULFATE HFA 108 (90 BASE) MCG/ACT IN AERS: INHALATION_SPRAY | RESPIRATORY_TRACT | Status: DC

## 2016-04-23 MED ORDER — PREDNISONE 20 MG PO TABS: ORAL_TABLET | ORAL | Status: DC

## 2016-05-05 ENCOUNTER — Encounter: Payer: Self-pay | Admitting: Internal Medicine

## 2016-05-05 ENCOUNTER — Other Ambulatory Visit: Payer: Self-pay | Admitting: Internal Medicine

## 2016-05-30 ENCOUNTER — Other Ambulatory Visit: Payer: Self-pay | Admitting: Internal Medicine

## 2016-06-04 ENCOUNTER — Other Ambulatory Visit: Payer: Self-pay | Admitting: *Deleted

## 2016-06-04 MED ORDER — TESTOSTERONE CYPIONATE 200 MG/ML IM SOLN: INTRAMUSCULAR | Status: DC

## 2016-06-17 ENCOUNTER — Other Ambulatory Visit: Payer: Self-pay | Admitting: *Deleted

## 2016-06-17 MED ORDER — GABAPENTIN 100 MG PO CAPS: ORAL_CAPSULE | ORAL | Status: DC

## 2016-06-24 LAB — MICROALBUMIN / CREATININE URINE RATIO

## 2016-07-10 ENCOUNTER — Other Ambulatory Visit: Payer: Self-pay | Admitting: Internal Medicine

## 2016-07-22 ENCOUNTER — Ambulatory Visit: Payer: Self-pay | Admitting: Internal Medicine

## 2016-08-29 ENCOUNTER — Other Ambulatory Visit: Payer: Self-pay | Admitting: Internal Medicine

## 2016-09-29 ENCOUNTER — Ambulatory Visit (INDEPENDENT_AMBULATORY_CARE_PROVIDER_SITE_OTHER): Payer: Managed Care, Other (non HMO) | Admitting: Internal Medicine

## 2016-09-29 ENCOUNTER — Encounter: Payer: Self-pay | Admitting: Internal Medicine

## 2016-09-29 VITALS — BP 164/82 | HR 72 | Temp 97.5°F | Resp 16 | Ht 76.75 in | Wt 271.1 lb

## 2016-09-29 DIAGNOSIS — M25549 Pain in joints of unspecified hand: Secondary | ICD-10-CM | POA: Diagnosis not present

## 2016-09-29 NOTE — Progress Notes (Signed)
  Subjective:    Patient ID: Spencer Love, male    DOB: 04/04/1986, 30 y.o.   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 He is currently on Meloxicam for chronic back pain. Also patent reports anxiety resultant from circumstance from some trouble some foster children (that his mother had raised)have gotten into.    Medication Sig  . albuterol  HFA inhaler 2 inhales 5-10 min apart 4 x day or every 4 hrs   . aspirin EC 81 MG tablet Take 81 mg by mouth 2 (two) times daily.  Marland Kitchen. VITAMIN D 5000 units  Take 1 capsule by mouth 2 (two) times daily.  . cloNIDine  0.2 MG tablet TAKE 1/2-1 TAB 3 TIMES A DAY-  . fish oil-omega-3 1000 MG  Take 1 g by mouth 2 (two) times daily.  Marland Kitchen. FLAX SEED OIL Take 1 tablet by mouth 2 (two) times daily.  Marland Kitchen. gabapentin100 MG capsule Take 1 capsule 3 x / day for back pain  . hctz 25 MG tablet Take 1 tablet (25 mg total) by mouth daily.  Marland Kitchen. lisinopril  20 MG tablet Take 1 tablet (20 mg total) by mouth 2 (two) times daily.  Marland Kitchen. LORazepam  1 MG tablet TAKE 1/2-1 TAB- THREE TIMES A DAY  . Magnesium 400 MG TABS Take 400 mg by mouth daily.  . meloxicam  15 MG tablet TAKE 1 TAB- ONCE A DAY  . metFORMIN 500 MG tablet TAKE ONE TAB- TWICE DAILY  . omeprazole40 MG capsule Take 1 capsule at bedtime -  . propranolol  80 MG tablet Take 1 tab 3  times daily.  Marland Kitchen. testosterone cypio 200 MG/ML injec Inject 2 ml IM every 2 weeks  . zinc gluconate 50 MG tablet Take  daily.  Marland Kitchen. buPROPion- XL 300 MG Take 1 tablet every morning for mood & ADD   Allergies  Allergen Reactions  . Peanut-Containing Drug Products Anaphylaxis  . Ceclor [Cefaclor] Hives and Swelling  . Sulfa Antibiotics Hives   Past Medical History:  Diagnosis Date  . Anxiety   . Depression   . Hidradenitis suppurativa   . Hypertension   . Prediabetes    Review of Systems  10 point systems review negative except as  above.    Objective:   Physical Exam  BP (!) 164/82   Pulse 72   Temp 97.5 F (36.4 C)   Resp 16   Ht 6' 4.75" (1.949 m)   Wt 271 lb 1.6 oz (123 kg)   BMI 32.36 kg/m   HEENT - WNL Neck - supple. Chest - Clear. Cor - Nl HS. RRR w/o sig M. MS- FROM w/o deformities. Muscle power, tone and bulk Nl. Gait Nl. No discrete tenderness of the PIP/DIP if the Index fingers or stigmata of synovitis Neuro - No obvious Cr N abnormalities. Sensory, motor and Cerebellar functions appear Nl w/o focal abnormalities.     Assessment & Plan:   1. Pain in multiple finger joints  - Recc try Tumeric 900 mg w/Bioperine bid

## 2016-10-03 IMAGING — MR MR MRA HEAD W/O CM
9 of 12 series · 31 of 48 positions shown · non-contrast
Comparison: None.

CLINICAL DATA: Family history of bicuspid aortic valve. The patient
is concerned about associated cerebral aneurysms. The patient
requests screening.

EXAM:
MRI HEAD WITHOUT CONTRAST
MRA HEAD WITHOUT CONTRAST
TECHNIQUE: Multiplanar, multiecho pulse sequences of the brain and surrounding
structures were obtained without intravenous contrast. Angiographic
images of the head were obtained using MRA technique without
contrast.

[Series 3: DWI · axial · 3.0mm · 1.09mm/px · z∈[-40,+91]mm · 6 of 90 slices shown (1 of 4)]
[im 1/90]
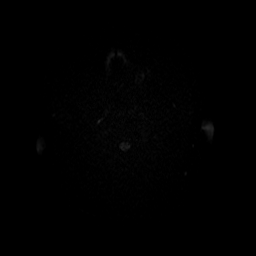
[im 18/90]
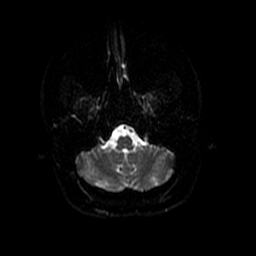
[im 36/90]
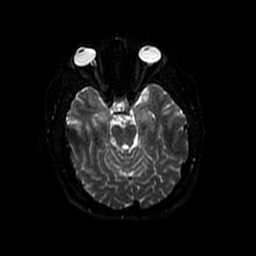
[im 54/90]
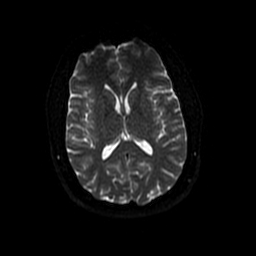
[im 72/90]
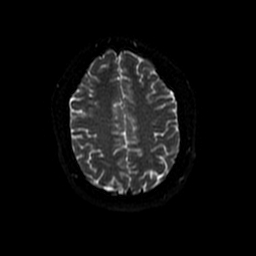
[im 90/90]
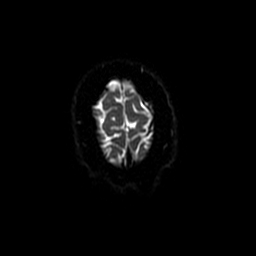

[Series 4: (id) mt fs · axial · 1.4mm · 0.43mm/px · z∈[-50,-9]mm · 4 of 152 slices shown]
[im 1/152]
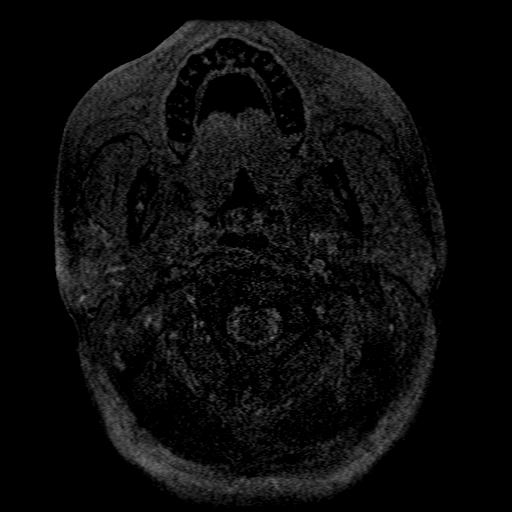
[im 31/152]
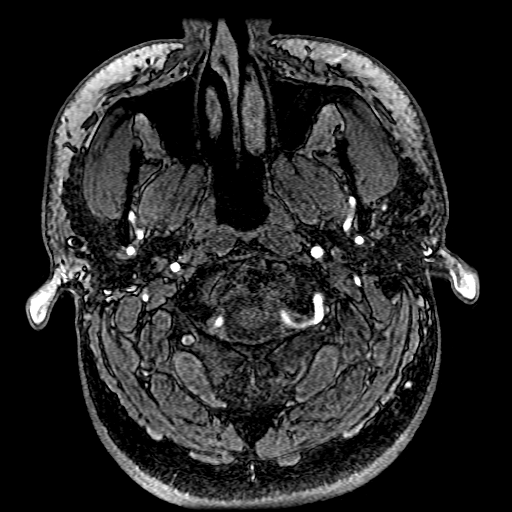
[im 46/152]
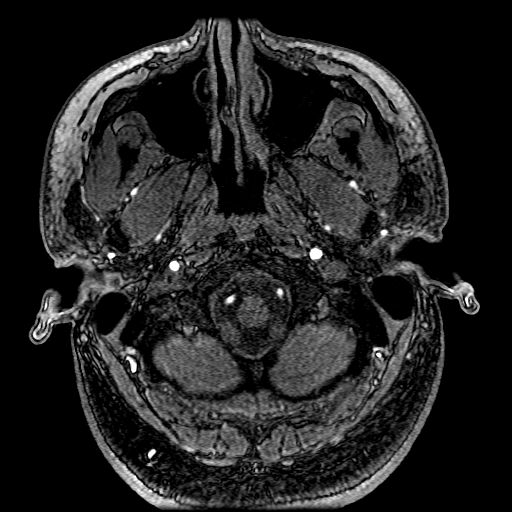
[im 61/152]
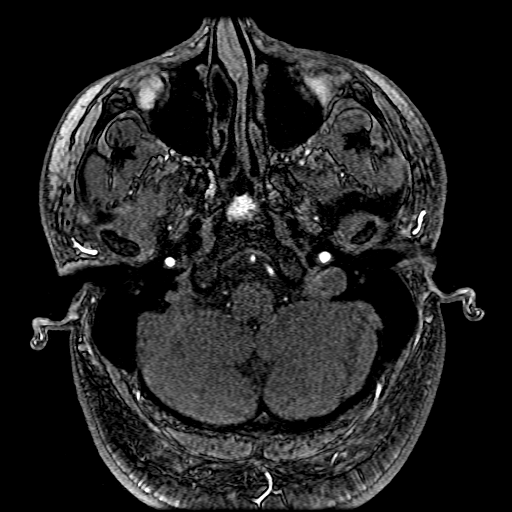

[Series 5: T1 · sagittal · 5.0mm · 0.47mm/px · 2 of 23 slices shown]
[im 1/23]
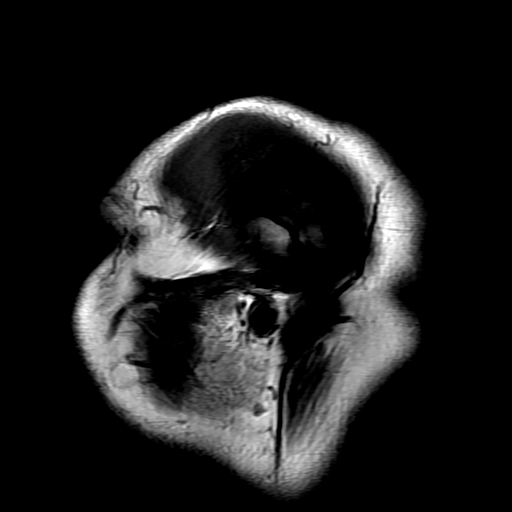
[im 23/23]
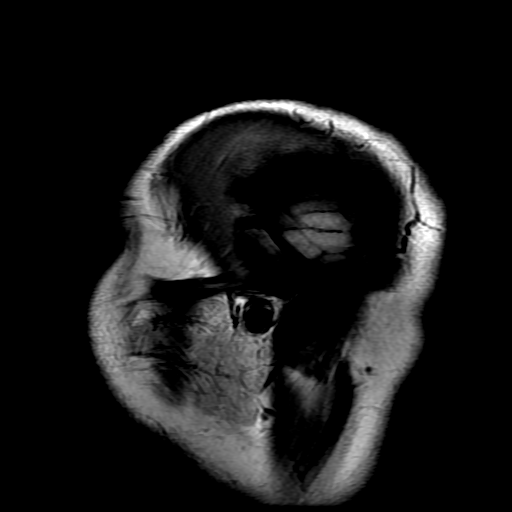

[Series 6: T2 · axial · 5.0mm · 0.45mm/px · z∈[-43,+88]mm · 2 of 23 slices shown (1 of 2)]
[im 1/23]
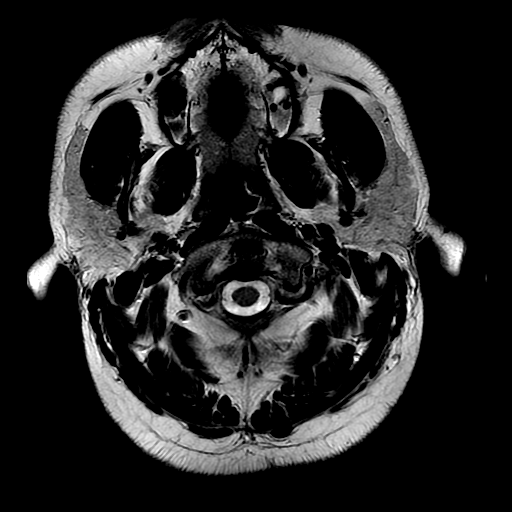
[im 23/23]
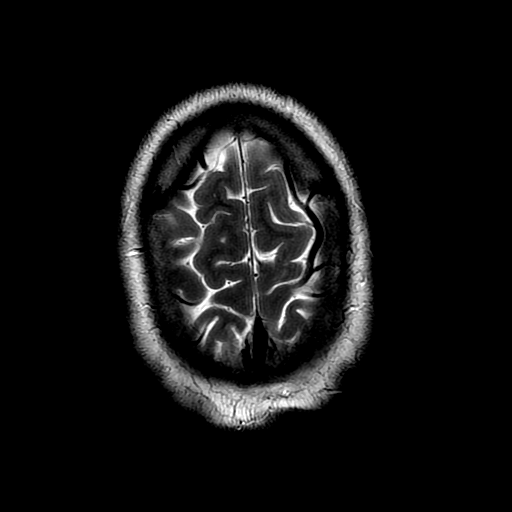

[Series 7: DWI · coronal · 5.0mm · 1.09mm/px · 6 of 70 slices shown (2 of 4)]
[im 1/70]
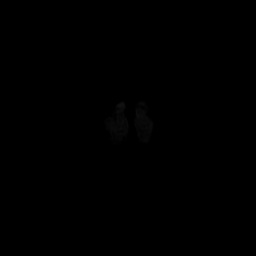
[im 14/70]
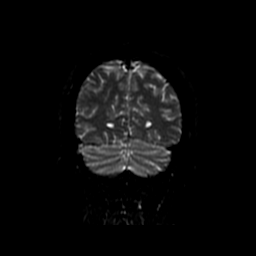
[im 28/70]
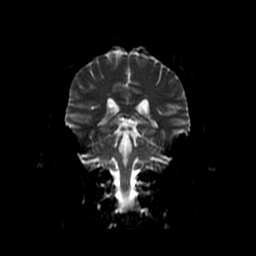
[im 42/70]
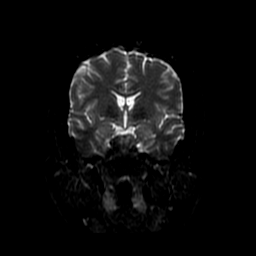
[im 56/70]
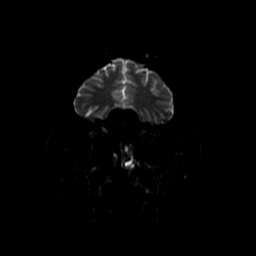
[im 70/70]
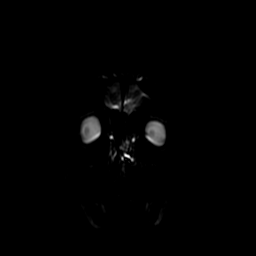

[Series 8: FLAIR · axial · 5.0mm · 0.45mm/px · z∈[-43,+88]mm · 2 of 23 slices shown]
[im 1/23]
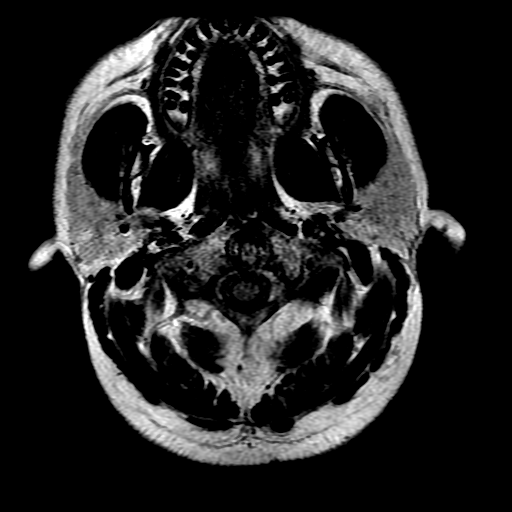
[im 23/23]
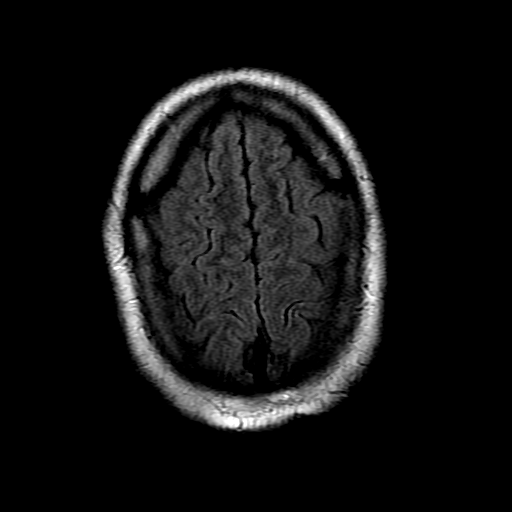

[Series 11: T2 · coronal · 5.0mm · 0.39mm/px · 2 of 27 slices shown (2 of 2)]
[im 1/27]
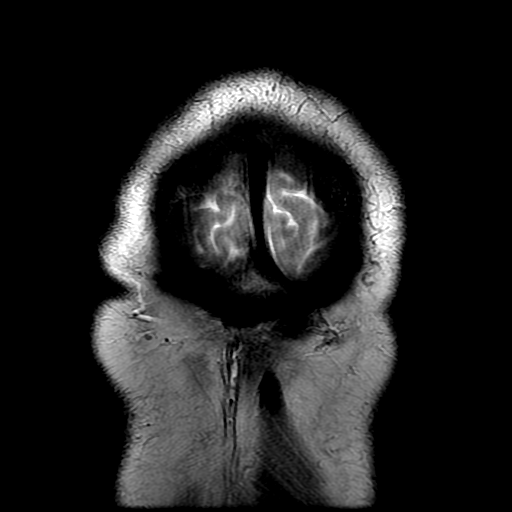
[im 27/27]
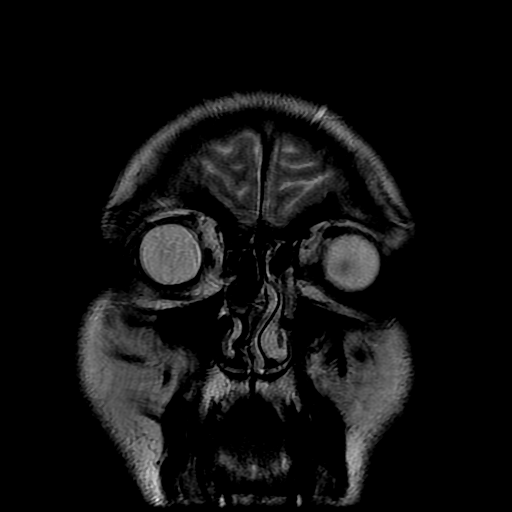

[Series 300: DWI · axial · 3.0mm · 1.09mm/px · z∈[-40,+91]mm · 4 of 45 slices shown (3 of 4)]
[im 1/45]
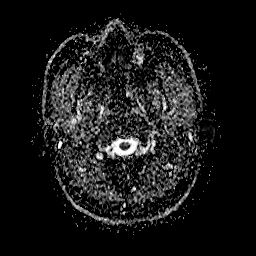
[im 15/45]
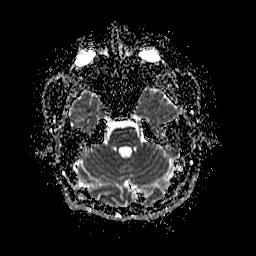
[im 30/45]
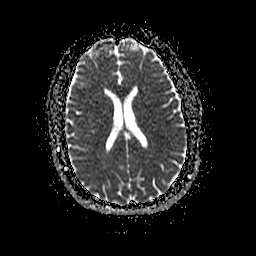
[im 45/45]
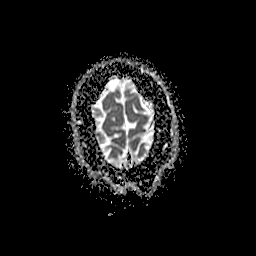

[Series 700: DWI · coronal · 5.0mm · 1.09mm/px · 3 of 35 slices shown (4 of 4)]
[im 1/35]
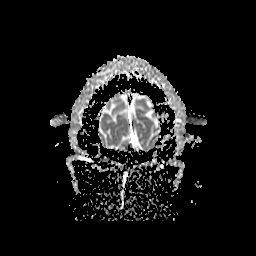
[im 18/35]
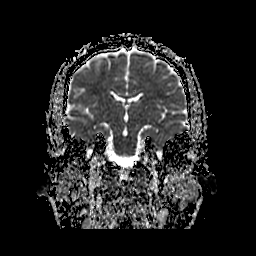
[im 35/35]
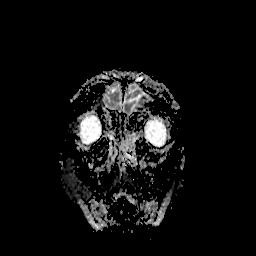

[31 of 48 positions shown; findings below may reference images not displayed]

FINDINGS: MRI HEAD FINDINGS

The study is mildly degraded by patient motion. No acute infarct,
hemorrhage, or mass lesion is present. The ventricles are of normal
size. Insert pass fluid

No significant white matter disease is present. The internal
auditory canals are within normal limits bilaterally. The brainstem
and cerebellum are normal.

Flow is present in the major intracranial arteries. The globes and
orbits are intact. A polyp or mucous retention cyst is present along
the floor of the left maxillary sinus. Rightward nasal septal
spurring inferiorly impacts the right inferior turbinate. A concha
bullosa is present in the middle turbinates bilaterally. The
paranasal sinuses are otherwise clear.

MRA HEAD FINDINGS

The internal carotid arteries are within normal limits from the high
cervical segments through the ICA termini. The right A1 segment is
aplastic, a normal variant. The anterior communicating artery is
patent. Both anterior cerebral arteries fill from the left. The left
A1 segment is normal. The M1 segments are within normal limits. The
MCA bifurcations are unremarkable. ACA and MCA branch vessels are
within normal limits.

The vertebral arteries are codominant. The PICA origins are
visualized and normal. The basilar artery is normal. The left
posterior cerebral artery originates from the basilar artery. The
right posterior cerebral artery is fed by the P1 segment and the
posterior communicating artery. The PCA branch vessels are within
normal limits bilaterally.
IMPRESSION: 1. Normal MRI appearance of the brain.
2. Normal variant circle of Willis without evidence for significant
proximal stenosis, aneurysm, or branch vessel occlusion.

## 2016-10-06 ENCOUNTER — Other Ambulatory Visit: Payer: Self-pay | Admitting: Internal Medicine

## 2016-10-06 DIAGNOSIS — F32A Depression, unspecified: Secondary | ICD-10-CM

## 2016-10-06 DIAGNOSIS — F329 Major depressive disorder, single episode, unspecified: Secondary | ICD-10-CM

## 2016-10-06 DIAGNOSIS — K219 Gastro-esophageal reflux disease without esophagitis: Secondary | ICD-10-CM

## 2016-10-07 ENCOUNTER — Other Ambulatory Visit: Payer: Self-pay | Admitting: *Deleted

## 2016-10-07 MED ORDER — LORAZEPAM 1 MG PO TABS: ORAL_TABLET | ORAL | 3 refills | Status: DC

## 2016-10-20 ENCOUNTER — Encounter: Payer: Self-pay | Admitting: Internal Medicine

## 2016-10-20 ENCOUNTER — Ambulatory Visit (INDEPENDENT_AMBULATORY_CARE_PROVIDER_SITE_OTHER): Payer: Managed Care, Other (non HMO) | Admitting: Internal Medicine

## 2016-10-20 VITALS — BP 126/64 | HR 86 | Temp 98.2°F | Resp 18 | Ht 76.75 in

## 2016-10-20 DIAGNOSIS — J069 Acute upper respiratory infection, unspecified: Secondary | ICD-10-CM | POA: Diagnosis not present

## 2016-10-20 MED ORDER — RANITIDINE HCL 300 MG PO TABS: 300.0000 mg | ORAL_TABLET | Freq: Every day | ORAL | 1 refills | Status: DC

## 2016-10-20 MED ORDER — CETIRIZINE HCL 10 MG PO TABS: 10.0000 mg | ORAL_TABLET | Freq: Every day | ORAL | 0 refills | Status: DC

## 2016-10-20 MED ORDER — AZELASTINE HCL 0.1 % NA SOLN: 2.0000 | Freq: Two times a day (BID) | NASAL | 2 refills | Status: DC

## 2016-10-20 MED ORDER — PREDNISONE 20 MG PO TABS: ORAL_TABLET | ORAL | 0 refills | Status: DC

## 2016-10-20 NOTE — Progress Notes (Signed)
HPI  Patient presents to the office for evaluation of cough.  It has been going on for 1 weeks.  Patient reports wet, barky cough with chest congestion.  They also endorse chills, fever, postnasal drip and nasal congestion, green nasal sputum, sore throat, ear pressure and congestion.  No ear drainage.  .  They have tried zpak, guanfenison, and tylenol.  They report that nothing has worked.  They admits to other sick contacts.  Both his mother and his brother have been sick with similar symptoms.     Review of Systems  Constitutional: Positive for malaise/fatigue. Negative for chills and fever.  HENT: Positive for congestion, ear pain, hearing loss and sore throat.   Respiratory: Positive for cough. Negative for sputum production, shortness of breath and wheezing.   Cardiovascular: Negative for chest pain, palpitations and leg swelling.  Neurological: Positive for headaches.    PE:  General:  Alert and non-toxic, WDWN, NAD HEENT: NCAT, PERLA, EOM normal, no occular discharge or erythema.  Nasal mucosal edema with sinus tenderness to palpation.  Oropharynx clear with minimal oropharyngeal edema and erythema.  Mucous membranes moist and pink. Neck:  Cervical adenopathy Chest:  RRR no MRGs.  Lungs clear to auscultation A&P with no wheezes rhonchi or rales.   Abdomen: +BS x 4 quadrants, soft, non-tender, no guarding, rigidity, or rebound. Skin: warm and dry no rash Neuro: A&Ox4, CN II-XII grossly intact  Assessment and Plan:   1. Acute URI -prednisone -has already finished a zpak -ranitidine -zyrtec -astelin -push fluids -tylenol prn -work not return Wednesday

## 2016-11-04 ENCOUNTER — Encounter: Payer: Self-pay | Admitting: Internal Medicine

## 2016-11-25 ENCOUNTER — Other Ambulatory Visit: Payer: Self-pay | Admitting: Internal Medicine

## 2016-11-26 ENCOUNTER — Other Ambulatory Visit: Payer: Self-pay | Admitting: Internal Medicine

## 2016-12-01 ENCOUNTER — Other Ambulatory Visit: Payer: Self-pay | Admitting: Internal Medicine

## 2016-12-04 ENCOUNTER — Ambulatory Visit (INDEPENDENT_AMBULATORY_CARE_PROVIDER_SITE_OTHER): Payer: Managed Care, Other (non HMO) | Admitting: Internal Medicine

## 2016-12-04 ENCOUNTER — Encounter: Payer: Self-pay | Admitting: Internal Medicine

## 2016-12-04 VITALS — BP 128/66 | HR 94 | Temp 99.0°F | Resp 18 | Ht 76.75 in | Wt 280.0 lb

## 2016-12-04 DIAGNOSIS — R7303 Prediabetes: Secondary | ICD-10-CM

## 2016-12-04 DIAGNOSIS — I10 Essential (primary) hypertension: Secondary | ICD-10-CM

## 2016-12-04 DIAGNOSIS — H538 Other visual disturbances: Secondary | ICD-10-CM

## 2016-12-04 DIAGNOSIS — J3489 Other specified disorders of nose and nasal sinuses: Secondary | ICD-10-CM | POA: Diagnosis not present

## 2016-12-04 DIAGNOSIS — E782 Mixed hyperlipidemia: Secondary | ICD-10-CM | POA: Diagnosis not present

## 2016-12-04 DIAGNOSIS — Z79899 Other long term (current) drug therapy: Secondary | ICD-10-CM | POA: Diagnosis not present

## 2016-12-04 LAB — HEPATIC FUNCTION PANEL
ALBUMIN: 4.7 g/dL (ref 3.6–5.1)
ALT: 31 U/L (ref 9–46)
AST: 19 U/L (ref 10–40)
Alkaline Phosphatase: 37 U/L — ABNORMAL LOW (ref 40–115)
Bilirubin, Direct: 0.1 mg/dL (ref <=0.2)
Indirect Bilirubin: 0.4 mg/dL (ref 0.2–1.2)
TOTAL PROTEIN: 6.5 g/dL (ref 6.1–8.1)
Total Bilirubin: 0.5 mg/dL (ref 0.2–1.2)

## 2016-12-04 LAB — LIPID PANEL
CHOLESTEROL: 164 mg/dL (ref <200)
HDL: 25 mg/dL — ABNORMAL LOW (ref >40)
LDL CALC: 95 mg/dL (ref <100)
TRIGLYCERIDES: 220 mg/dL — AB (ref <150)
Total CHOL/HDL Ratio: 6.6 Ratio — ABNORMAL HIGH (ref <5.0)
VLDL: 44 mg/dL — ABNORMAL HIGH (ref <30)

## 2016-12-04 LAB — BASIC METABOLIC PANEL WITH GFR
BUN: 15 mg/dL (ref 7–25)
CALCIUM: 9.4 mg/dL (ref 8.6–10.3)
CO2: 30 mmol/L (ref 20–31)
CREATININE: 0.96 mg/dL (ref 0.60–1.35)
Chloride: 100 mmol/L (ref 98–110)
GFR, Est Non African American: >89 mL/min (ref >=60)
GLUCOSE: 90 mg/dL (ref 65–99)
Potassium: 4.1 mmol/L (ref 3.5–5.3)
Sodium: 138 mmol/L (ref 135–146)

## 2016-12-04 LAB — TSH: TSH: 0.92 m[IU]/L (ref 0.40–4.50)

## 2016-12-04 LAB — CBC WITH DIFFERENTIAL/PLATELET
BASOS ABS: 0 {cells}/uL (ref 0–200)
BASOS PCT: 0 %
EOS ABS: 160 {cells}/uL (ref 15–500)
Eosinophils Relative: 2 %
HEMATOCRIT: 41.7 % (ref 38.5–50.0)
HEMOGLOBIN: 14.5 g/dL (ref 13.2–17.1)
Lymphocytes Relative: 27 %
Lymphs Abs: 2160 cells/uL (ref 850–3900)
MCH: 29.8 pg (ref 27.0–33.0)
MCHC: 34.8 g/dL (ref 32.0–36.0)
MCV: 85.8 fL (ref 80.0–100.0)
MONO ABS: 560 {cells}/uL (ref 200–950)
MPV: 10.5 fL (ref 7.5–12.5)
Monocytes Relative: 7 %
NEUTROS ABS: 5120 {cells}/uL (ref 1500–7800)
Neutrophils Relative %: 64 %
Platelets: 217 10*3/uL (ref 140–400)
RBC: 4.86 MIL/uL (ref 4.20–5.80)
RDW: 13.8 % (ref 11.0–15.0)
WBC: 8 10*3/uL (ref 3.8–10.8)

## 2016-12-04 MED ORDER — DEXAMETHASONE SODIUM PHOSPHATE 10 MG/ML IJ SOLN
10.0000 mg | Freq: Once | INTRAMUSCULAR | Status: AC
  Administered 2016-12-04: 10 mg via INTRAMUSCULAR

## 2016-12-04 NOTE — Progress Notes (Signed)
Subjective:    Patient ID: Spencer Love, male    DOB: 07/30/1986, [age] y.o.   MRN: [id number]  HPI  Patient presents to the office for evaluation of blurry vision.  He reports that he was having some blurred vision with distance vision.  He reports that this started after he had taken his glasses off.  This was a sudden change.  He feels like he has some changes in his depth perception has changed.  He does feel like he has some pressure behind his eyes.  He reports that he is aggravated with the change.  He reports that he has recently quit smoking.  He never lost vision.  He [facility]  He reports that he has not gotten new glasses in years.  He reports no headaches, no runny nose or congestion.  He does have some sinus pressure.  No ear pain.  No dizziness or lightheadedness.    Patient reports that he is overdue for lab testing for his prediabetes, HTN, HL.  He had to cancel his last visit.  He reports that these have been doing well.  He has no complaints.    Review of Systems  Constitutional: Negative for chills, fatigue and fever.  HENT: Positive for postnasal drip. Negative for congestion, facial swelling, rhinorrhea, sinus pain and sore throat.   Eyes: Positive for visual disturbance. Negative for photophobia, pain, discharge, redness and itching.  Respiratory: Negative for chest tightness and shortness of breath.   Cardiovascular: Negative for chest pain and palpitations.  Neurological: Negative for dizziness, speech difficulty, weakness and headaches.       Objective:   Physical Exam  Constitutional: He is oriented to person, place, and time. He appears well-developed and well-nourished. No distress.  HENT:  Head: Normocephalic.  Mouth/Throat: Oropharynx is clear and moist. No oropharyngeal exudate.  Eyes: Conjunctivae are normal. No scleral icterus.  Neck: Normal range of motion. Neck supple. No JVD present. No  thyromegaly present.  Cardiovascular: Normal rate, regular rhythm, normal heart sounds and intact distal pulses.  Exam reveals no gallop and no friction rub.   No murmur heard. Pulmonary/Chest: Effort normal and breath sounds normal. No respiratory distress. He has no wheezes. He has no rales. He exhibits no tenderness.  Abdominal: Soft. Bowel sounds are normal. He exhibits no distension and no mass. There is no tenderness. There is no rebound and no guarding.  Musculoskeletal: Normal range of motion.  Lymphadenopathy:    He has no cervical adenopathy.  Neurological: He is alert and oriented to person, place, and time. No cranial nerve deficit. Coordination normal.  Skin: Skin is warm and dry. No rash noted. He is not diaphoretic.  Psychiatric: He has a normal mood and affect. His behavior is normal. Judgment and thought content normal.  Nursing note and vitals reviewed.   Vitals:   12/04/16 1554  BP: 128/66  Pulse: 94  Resp: 18  Temp: 99 F (37.2 C)          Assessment & Plan:    1. Sinus pressure  - dexamethasone (DECADRON) injection 10 mg; Inject 1 mL (10 mg total) into the muscle once.  2. Blurry vision -eye exam normal with normal red reflex -may be coming from sinus pressure -no red flags -have asked patient to move up eye exam -may need new prescription as hasn't changed it in several years.  3. Essential hypertension -cont meds -well controlled -dash diet -  monitor - TSH  4. Prediabetes -cont diet and exercise - Hemoglobin A1c  5. Mixed hyperlipidemia -cont diet and exercise - Lipid panel  6. Medication management  - CBC with Differential/Platelet - BASIC METABOLIC PANEL WITH GFR - Hepatic function panel

## 2016-12-04 NOTE — Patient Instructions (Signed)
Blurred Vision Having blurred vision means that you cannot see things clearly. Your vision may seem fuzzy or out of focus. Blurred vision is a very common symptom of an eye or vision problem. Blurred vision is often a gradual blur that occurs in one eye or both eyes. There are many causes of blurred vision, including cataracts, macular degeneration, and diabetic retinopathy. Blurred vision can be diagnosed based on your symptoms and a physical exam. Tell your health care provider about any other health problems you have, any recent eye injury, and any prior surgeries. You may need to see a health care provider who specializes in eye problems (ophthalmologist). Your treatment depends on what is causing your blurred vision.  HOME CARE INSTRUCTIONS  Tell your health care provider about any changes in your blurred vision.  Do not drive or operate heavy machinery if your vision is blurry.  Keep all follow-up visits as directed by your health care provider. This is important. SEEK MEDICAL CARE IF:  Your symptoms get worse.  You have new symptoms.  You have trouble seeing at night.  You have trouble seeing up close or far away.  You have trouble noticing the difference between colors. SEEK IMMEDIATE MEDICAL CARE IF:  You have severe eye pain.  You have a severe headache.  You have flashing lights in your field of vision.  You have a sudden change in vision.  You have a sudden loss of vision.  You have vision change after an injury.  You notice drainage coming from your eyes.  You notice a rash around your eyes. This information is not intended to replace advice given to you by your health care provider. Make sure you discuss any questions you have with your health care provider. Document Released: 12/18/2003 Document Revised: 05/01/2015 Document Reviewed: 11/08/2014 Elsevier Interactive Patient Education  2017 [employer]  

## 2016-12-05 LAB — HEMOGLOBIN A1C
Hgb A1c MFr Bld: 4.9 % (ref <5.7)
Mean Plasma Glucose: 94 mg/dL

## 2016-12-07 ENCOUNTER — Other Ambulatory Visit: Payer: Self-pay | Admitting: Internal Medicine

## 2016-12-07 ENCOUNTER — Encounter: Payer: Self-pay | Admitting: Internal Medicine

## 2016-12-07 MED ORDER — AZITHROMYCIN 250 MG PO TABS: ORAL_TABLET | ORAL | 0 refills | Status: DC

## 2017-01-03 ENCOUNTER — Encounter: Payer: Self-pay | Admitting: *Deleted

## 2017-01-12 ENCOUNTER — Other Ambulatory Visit: Payer: Self-pay | Admitting: Internal Medicine

## 2017-01-12 DIAGNOSIS — K219 Gastro-esophageal reflux disease without esophagitis: Secondary | ICD-10-CM

## 2017-02-14 ENCOUNTER — Other Ambulatory Visit: Payer: Self-pay | Admitting: Internal Medicine

## 2017-02-15 NOTE — Telephone Encounter (Signed)
Please call Lorazepam 

## 2017-02-26 ENCOUNTER — Other Ambulatory Visit: Payer: Self-pay | Admitting: Internal Medicine

## 2017-02-26 NOTE — Telephone Encounter (Signed)
Pleas call Depo-Testosterone

## 2017-03-06 ENCOUNTER — Encounter: Payer: Self-pay | Admitting: Cardiology

## 2017-03-07 NOTE — Progress Notes (Signed)
HPI The patient presents for follow up of dyspnea .  He has mild AI.  He has mild LVH.   At the last appt he had dyspnea and I evaluated him with a POET (Plain Old Exercise Treadmill) that demonstrated no ischemia.  Since I last saw him he stopped smoking.  He works very hard.  The patient denies any new symptoms such as chest discomfort, neck or arm discomfort. There has been no new shortness of breath, PND or orthopnea. There have been no reported palpitations, presyncope or syncope.  Allergies  Allergen Reactions  . Peanut-Containing Drug Products Anaphylaxis  . Ceclor [Cefaclor] Hives and Swelling  . Sulfa Antibiotics Hives    Current Outpatient Prescriptions  Medication Sig Dispense Refill  . aspirin EC 81 MG tablet Take 81 mg by mouth 2 (two) times daily.    Marland Kitchen. b complex vitamins tablet Take 1 tablet by mouth daily.    Marland Kitchen. buPROPion (WELLBUTRIN XL) 300 MG 24 hr tablet TAKE 1 TABLET EVERY MORNING FOR MOOD & ADD 90 tablet 1  . cetirizine (ZYRTEC) 10 MG tablet Take 1 tablet (10 mg total) by mouth daily. 30 tablet 0  . Cholecalciferol (VITAMIN D3) 5000 units CAPS Take 1 capsule by mouth 2 (two) times daily.    . cloNIDine (CATAPRES) 0.2 MG tablet TAKE 1/2 TO 1 TABLET BY MOUTH 3 TIMES A DAY FOR BLOOD PRESSURE 270 tablet 1  . fish oil-omega-3 fatty acids 1000 MG capsule Take 1 g by mouth 2 (two) times daily.    . Flaxseed, Linseed, (FLAX SEED OIL PO) Take 1 tablet by mouth 2 (two) times daily.    Marland Kitchen. gabapentin (NEURONTIN) 100 MG capsule TAKE 1 CAPSULE BY MOUTH 3TIMES A DAY FOR BACK PAIN 270 capsule 1  . hydrochlorothiazide (HYDRODIURIL) 25 MG tablet TAKE 1 TABLET (25 MG TOTAL) BY MOUTH DAILY. 90 tablet 1  . lisinopril (PRINIVIL,ZESTRIL) 20 MG tablet TAKE 1 TABLET BY MOUTH TWICE A DAY 180 tablet 1  . LORazepam (ATIVAN) 1 MG tablet Take 1/2 to 1 tablet up to 2 or 3 x / day only if needed for anxiety attack 90 tablet 0  . Magnesium 400 MG TABS Take 400 mg by mouth daily.    . meloxicam  (MOBIC) 15 MG tablet TAKE 1 TABLET BY MOUTH ONCE A DAY 90 tablet 1  . metFORMIN (GLUCOPHAGE) 500 MG tablet TAKE 1 TABLET BY MOUTH TWICE A DAY 180 tablet 1  . omeprazole (PRILOSEC) 40 MG capsule TAKE 1 CAPSULE BY MOUTH AT BEDTIME FOR HEARTBURN AND ACID REFLUX 90 capsule 1  . omeprazole (PRILOSEC) 40 MG capsule TAKE 1 CAPSULE BY MOUTH AT BEDTIME FOR HEARTBURN AND ACID REFLUX 90 capsule 0  . propranolol (INDERAL) 80 MG tablet TAKE 1 TABLET BY MOUTH 3 TIMES DAILY. 270 tablet 1  . SYRINGE-NEEDLE, DISP, 3 ML (BD ECLIPSE SYRINGE) 21G X 1" 3 ML MISC Inject 2 ml IM every 2 weeks. 25 each 1  . testosterone cypionate (DEPOTESTOSTERONE CYPIONATE) 200 MG/ML injection INJECT 2 ML'S INTO THE MUSCLE EVERY 2 WEEKS 10 mL 2  . zinc gluconate 50 MG tablet Take 50 mg by mouth daily.     No current facility-administered medications for this visit.     Past Medical History:  Diagnosis Date  . Anxiety   . Depression   . Hidradenitis suppurativa   . Hypertension   . Prediabetes     Past Surgical History:  Procedure Laterality Date  . None  ROS:    As stated in the HPI and negative for all other systems.  PHYSICAL EXAM BP 122/70 (BP Location: Right Arm, Patient Position: Sitting, Cuff Size: Large)   Pulse 63   Ht 6\' 5"  (1.956 m)   Wt 278 lb 3.2 oz (126.2 kg)   BMI 32.99 kg/m  GENERAL:  Well appearing NECK:  No jugular venous distention, waveform within normal limits, carotid upstroke brisk and symmetric, no bruits, no thyromegaly LUNGS:  Clear to auscultation bilaterally CHEST:  Unremarkable HEART:  PMI not displaced or sustained,S1 and S2 within normal limits, no S3, no S4, no clicks, no rubs, no murmurs ABD:  Flat, positive bowel sounds normal in frequency in pitch, no bruits, no rebound, no guarding, no midline pulsatile mass, no hepatomegaly, no splenomegaly EXT:  2 plus pulses throughout, no edema, no cyanosis no clubbing  EKG:  Sinus rhythm, rate 63, axis within normal limits, intervals  within normal limits, no acute ST-T wave changes.  03/09/2017   ASSESSMENT AND PLAN  DYSPNEA:   This is mild and has been evaluated.  No change in therapy or further evaluation is indicated.   AI: This was mild. No further imaging is indcated.   HTN:  The blood pressure is at target. No change in medications is indicated. We will continue with therapeutic lifestyle changes (TLC).  SLEEP APNEA:   Per Dr. Craige Cotta  TOBACCO: He quit smoking!!!MRI/MRA to further assess.

## 2017-03-09 ENCOUNTER — Ambulatory Visit (INDEPENDENT_AMBULATORY_CARE_PROVIDER_SITE_OTHER): Payer: Managed Care, Other (non HMO) | Admitting: Cardiology

## 2017-03-09 ENCOUNTER — Encounter: Payer: Self-pay | Admitting: Cardiology

## 2017-03-09 ENCOUNTER — Ambulatory Visit (INDEPENDENT_AMBULATORY_CARE_PROVIDER_SITE_OTHER): Payer: Managed Care, Other (non HMO) | Admitting: Internal Medicine

## 2017-03-09 ENCOUNTER — Encounter: Payer: Self-pay | Admitting: Internal Medicine

## 2017-03-09 VITALS — BP 116/70 | HR 66 | Temp 98.2°F | Resp 18 | Ht 76.75 in | Wt 278.0 lb

## 2017-03-09 VITALS — BP 122/70 | HR 63 | Ht 77.0 in | Wt 278.2 lb

## 2017-03-09 DIAGNOSIS — Z125 Encounter for screening for malignant neoplasm of prostate: Secondary | ICD-10-CM

## 2017-03-09 DIAGNOSIS — E559 Vitamin D deficiency, unspecified: Secondary | ICD-10-CM

## 2017-03-09 DIAGNOSIS — Z0000 Encounter for general adult medical examination without abnormal findings: Secondary | ICD-10-CM

## 2017-03-09 DIAGNOSIS — G4733 Obstructive sleep apnea (adult) (pediatric): Secondary | ICD-10-CM

## 2017-03-09 DIAGNOSIS — I10 Essential (primary) hypertension: Secondary | ICD-10-CM | POA: Diagnosis not present

## 2017-03-09 DIAGNOSIS — Z136 Encounter for screening for cardiovascular disorders: Secondary | ICD-10-CM

## 2017-03-09 DIAGNOSIS — Z130 Encounter for screening for diseases of the blood and blood-forming organs and certain disorders involving the immune mechanism: Secondary | ICD-10-CM

## 2017-03-09 DIAGNOSIS — K219 Gastro-esophageal reflux disease without esophagitis: Secondary | ICD-10-CM

## 2017-03-09 DIAGNOSIS — Z113 Encounter for screening for infections with a predominantly sexual mode of transmission: Secondary | ICD-10-CM

## 2017-03-09 DIAGNOSIS — E349 Endocrine disorder, unspecified: Secondary | ICD-10-CM

## 2017-03-09 DIAGNOSIS — E782 Mixed hyperlipidemia: Secondary | ICD-10-CM

## 2017-03-09 DIAGNOSIS — R7303 Prediabetes: Secondary | ICD-10-CM

## 2017-03-09 DIAGNOSIS — R0602 Shortness of breath: Secondary | ICD-10-CM | POA: Diagnosis not present

## 2017-03-09 DIAGNOSIS — Z79899 Other long term (current) drug therapy: Secondary | ICD-10-CM

## 2017-03-09 LAB — CBC WITH DIFFERENTIAL/PLATELET
BASOS ABS: 0 {cells}/uL (ref 0–200)
BASOS PCT: 0 %
EOS ABS: 180 {cells}/uL (ref 15–500)
Eosinophils Relative: 3 %
HCT: 43.9 % (ref 38.5–50.0)
HEMOGLOBIN: 15.1 g/dL (ref 13.2–17.1)
Lymphocytes Relative: 37 %
Lymphs Abs: 2220 cells/uL (ref 850–3900)
MCH: 29.1 pg (ref 27.0–33.0)
MCHC: 34.4 g/dL (ref 32.0–36.0)
MCV: 84.6 fL (ref 80.0–100.0)
MONOS PCT: 8 %
MPV: 10.8 fL (ref 7.5–12.5)
Monocytes Absolute: 480 cells/uL (ref 200–950)
NEUTROS ABS: 3120 {cells}/uL (ref 1500–7800)
Neutrophils Relative %: 52 %
PLATELETS: 178 10*3/uL (ref 140–400)
RBC: 5.19 MIL/uL (ref 4.20–5.80)
RDW: 13.8 % (ref 11.0–15.0)
WBC: 6 10*3/uL (ref 3.8–10.8)

## 2017-03-09 LAB — HEPATIC FUNCTION PANEL
ALK PHOS: 45 U/L (ref 40–115)
ALT: 29 U/L (ref 9–46)
AST: 19 U/L (ref 10–40)
Albumin: 4.5 g/dL (ref 3.6–5.1)
BILIRUBIN DIRECT: 0.1 mg/dL (ref <=0.2)
BILIRUBIN TOTAL: 0.4 mg/dL (ref 0.2–1.2)
Indirect Bilirubin: 0.3 mg/dL (ref 0.2–1.2)
Total Protein: 6.5 g/dL (ref 6.1–8.1)

## 2017-03-09 LAB — IRON AND TIBC
%SAT: 30 % (ref 15–60)
Iron: 95 ug/dL (ref 50–195)
TIBC: 317 ug/dL (ref 250–425)
UIBC: 222 ug/dL (ref 125–400)

## 2017-03-09 LAB — BASIC METABOLIC PANEL WITH GFR
BUN: 14 mg/dL (ref 7–25)
CHLORIDE: 101 mmol/L (ref 98–110)
CO2: 28 mmol/L (ref 20–31)
Calcium: 9.1 mg/dL (ref 8.6–10.3)
Creat: 0.91 mg/dL (ref 0.60–1.35)
GFR, Est African American: >89 mL/min (ref >=60)
GFR, Est Non African American: >89 mL/min (ref >=60)
Glucose, Bld: 105 mg/dL — ABNORMAL HIGH (ref 65–99)
Potassium: 4.2 mmol/L (ref 3.5–5.3)
SODIUM: 139 mmol/L (ref 135–146)

## 2017-03-09 LAB — LIPID PANEL
CHOL/HDL RATIO: 6.9 ratio — AB (ref <5.0)
Cholesterol: 165 mg/dL (ref <200)
HDL: 24 mg/dL — AB (ref >40)
LDL CALC: 111 mg/dL — AB (ref <100)
Triglycerides: 150 mg/dL — ABNORMAL HIGH (ref <150)
VLDL: 30 mg/dL (ref <30)

## 2017-03-09 LAB — PSA: PSA: 0.9 ng/mL (ref <=4.0)

## 2017-03-09 LAB — VITAMIN B12: VITAMIN B 12: 448 pg/mL (ref 200–1100)

## 2017-03-09 NOTE — Progress Notes (Signed)
Complete Physical  Assessment and Plan:   1. Routine general medical examination at a health care facility -due next year  2. Essential hypertension -well controlled today -cont dash diet -monitor at home -cont current medications -clonidine is taken more for severe anxiety and depression and to help with sleep as opposed to BP control - Urinalysis, Routine w reflex microscopic - Microalbumin / creatinine urine ratio - TSH  3. OSA (obstructive sleep apnea) -cont weight loss -not currently on a CPAP machine per his report  4. Mixed hyperlipidemia -cont diet and exercise -LDL goal < 100 - Lipid panel  5. Prediabetes -can likely stop metformin -cont diet and exercise - Hemoglobin A1c - Insulin, random  6. Morbid obesity (HCC) -cont diet and exercise  7. Medication management  - CBC with Differential/Platelet - BASIC METABOLIC PANEL WITH GFR - Hepatic function panel - Magnesium  8. Gastroesophageal reflux disease, esophagitis presence not specified -cont diet and exercise for weight control -avoid trigger foods -cont omeprazole 40 mg  9. Vitamin D deficiency -cont supplement - VITAMIN D 25 Hydroxy (Vit-D Deficiency, Fractures)  10. Testosterone deficiency -cont injections -increase physical activity -ensure 7-8 hours of sleep per night - Testosterone  11. Screening for prostate cancer  - PSA  12. Screening for deficiency anemia  - Iron and TIBC - Vitamin B12  13. Screening for STDs (sexually transmitted diseases)  - HIV antibody - RPR - GC probe amplification, urine   Discussed med's effects and SE's. Screening labs and tests as requested with regular follow-up as recommended.  HPI Patient presents for a complete physical.   His blood pressure has been controlled at home, today their BP is BP: 116/70 He does not workout. He denies chest pain, shortness of breath, dizziness.  He does still work at [employer] so he does get some lifting and  strenuous activity there.     He is not on cholesterol medication and denies myalgias. His cholesterol is at goal. The cholesterol last visit was:   Lab Results  Component Value Date   CHOL 164 12/04/2016   HDL 25 (L) 12/04/2016   LDLCALC 95 12/04/2016   TRIG 220 (H) 12/04/2016   CHOLHDL 6.6 (H) 12/04/2016    He has been working on diet and exercise for prediabetes, he is on bASA, he is on ACE/ARB and denies foot ulcerations, hyperglycemia, hypoglycemia , increased appetite, nausea, paresthesia of the feet, polydipsia, polyuria, visual disturbances, vomiting and weight loss. Last A1C in the office was:  Lab Results  Component Value Date   HGBA1C 4.9 12/04/2016  he is currently taking metformin twice daily for weight control and blood sugar control.  He is having no trouble with diarrhea.    Patient is on Vitamin D supplement.   Lab Results  Component Value Date   VD25OH 53 04/17/2016      Last PSA was: No results found for: PSA.  Denies BPH symptoms daytime frequency, double voiding, dysuria, hematuria, hesitancy, incontinence, intermittency, nocturia, sensation of incomplete bladder emptying, suprapubic pain, urgency or weak urinary stream.  He reports that he has been doing the testosterone.  He preorts that he has a really low sexual drive.  His last testosterone shot was Saturday.  He does 1 unit every week.  He reports that he does 1 cc every week.  He feels better on this pattern.    [facility] and did get some new glasses. His blurry vision and double vision has  improved.    He has had no recent issues with his allergic rhinitis.  He is taking his daily medications to prevention.    Current Medications:  Current Outpatient Prescriptions on File Prior to Visit  Medication Sig Dispense Refill  . aspirin EC 81 MG tablet Take 81 mg by mouth 2 (two) times daily.    Marland Kitchen b complex vitamins tablet Take 1 tablet by mouth daily.    Marland Kitchen buPROPion  (WELLBUTRIN XL) 300 MG 24 hr tablet TAKE 1 TABLET EVERY MORNING FOR MOOD & ADD 90 tablet 1  . cetirizine (ZYRTEC) 10 MG tablet Take 1 tablet (10 mg total) by mouth daily. 30 tablet 0  . Cholecalciferol (VITAMIN D3) 5000 units CAPS Take 1 capsule by mouth 2 (two) times daily.    . cloNIDine (CATAPRES) 0.2 MG tablet TAKE 1/2 TO 1 TABLET BY MOUTH 3 TIMES A DAY FOR BLOOD PRESSURE 270 tablet 1  . fish oil-omega-3 fatty acids 1000 MG capsule Take 1 g by mouth 2 (two) times daily.    . Flaxseed, Linseed, (FLAX SEED OIL PO) Take 1 tablet by mouth 2 (two) times daily.    Marland Kitchen gabapentin (NEURONTIN) 100 MG capsule TAKE 1 CAPSULE BY MOUTH 3TIMES A DAY FOR BACK PAIN 270 capsule 1  . hydrochlorothiazide (HYDRODIURIL) 25 MG tablet TAKE 1 TABLET (25 MG TOTAL) BY MOUTH DAILY. 90 tablet 1  . lisinopril (PRINIVIL,ZESTRIL) 20 MG tablet TAKE 1 TABLET BY MOUTH TWICE A DAY 180 tablet 1  . LORazepam (ATIVAN) 1 MG tablet Take 1/2 to 1 tablet up to 2 or 3 x / day only if needed for anxiety attack 90 tablet 0  . Magnesium 400 MG TABS Take 400 mg by mouth daily.    . meloxicam (MOBIC) 15 MG tablet TAKE 1 TABLET BY MOUTH ONCE A DAY 90 tablet 1  . metFORMIN (GLUCOPHAGE) 500 MG tablet TAKE 1 TABLET BY MOUTH TWICE A DAY 180 tablet 1  . omeprazole (PRILOSEC) 40 MG capsule TAKE 1 CAPSULE BY MOUTH AT BEDTIME FOR HEARTBURN AND ACID REFLUX 90 capsule 1  . omeprazole (PRILOSEC) 40 MG capsule TAKE 1 CAPSULE BY MOUTH AT BEDTIME FOR HEARTBURN AND ACID REFLUX 90 capsule 0  . propranolol (INDERAL) 80 MG tablet TAKE 1 TABLET BY MOUTH 3 TIMES DAILY. 270 tablet 1  . SYRINGE-NEEDLE, DISP, 3 ML (BD ECLIPSE SYRINGE) 21G X 1" 3 ML MISC Inject 2 ml IM every 2 weeks. 25 each 1  . testosterone cypionate (DEPOTESTOSTERONE CYPIONATE) 200 MG/ML injection INJECT 2 ML'S INTO THE MUSCLE EVERY 2 WEEKS 10 mL 2  . zinc gluconate 50 MG tablet Take 50 mg by mouth daily.     No current facility-administered medications on file prior to visit.     Health  Maintenance:  Immunization History  Administered Date(s) Administered  . Influenza Split 10/11/2015  . Influenza,inj,Quad PF,36+ Mos 10/13/2014  . PPD Test 10/11/2015  . Tdap 10/11/2015   Flu vaccine: 2017  Patient Care Team: Lucky Cowboy, MD as PCP - General (Internal Medicine) Rollene Rotunda, MD as Consulting Physician (Cardiology) [name], MD as Consulting Physician (Pulmonary Disease)  Allergies:  Allergies  Allergen Reactions  . Peanut-Containing Drug Products Anaphylaxis  . Ceclor [Cefaclor] Hives and Swelling  . Sulfa Antibiotics Hives    Medical History:  Past Medical History:  Diagnosis Date  . Anxiety   . Depression   . Hidradenitis suppurativa   . Hypertension   . Prediabetes     Surgical History:  Past Surgical History:  Procedure Laterality Date  . None      Family History:  Family History  Problem Relation Age of Onset  . Heart disease Maternal Grandmother     Died from bypass surgery  . Heart disease Mother     Bicuspid aortic valve  . Heart disease Sister     Bicuspid aortic valve  . Colitis Sister   . Heart disease Father     Bicuspid aortic valve    Social History:   Social History  Substance Use Topics  . Smoking status: Former Smoker    Packs/day: 0.60    Years: 10.00    Types: Cigarettes    Quit date: 09/20/2016  . Smokeless tobacco: Never Used  . Alcohol use Yes     Comment: social    Review of Systems:  Review of Systems  Constitutional: Negative for chills, fever and malaise/fatigue.  HENT: Negative for congestion, ear pain and sore throat.   Eyes: Negative.   Respiratory: Negative for cough, shortness of breath and wheezing.   Cardiovascular: Negative for chest pain, palpitations and leg swelling.  Gastrointestinal: Negative for abdominal pain, blood in stool, constipation, diarrhea, heartburn and melena.  Genitourinary: Negative.   Skin: Negative.   Neurological: Negative for dizziness, sensory change, loss  of consciousness and headaches.  Psychiatric/Behavioral: Negative for depression. The patient is not nervous/anxious and does not have insomnia.     Physical Exam: Estimated body mass index is 33.18 kg/m as calculated from the following:   Height as of this encounter: 6' 4.75" (1.949 m).   Weight as of this encounter: 278 lb (126.1 kg). BP 116/70   Pulse 66   Temp 98.2 F (36.8 C) (Temporal)   Resp 18   Ht 6' 4.75" (1.949 m)   Wt 278 lb (126.1 kg)   BMI 33.18 kg/m   General Appearance: Well nourished, in no apparent distress.  Eyes: PERRLA, EOMs, conjunctiva no swelling or erythema ENT/Mouth: Ear canals clear bilaterally with no erythema, swelling, discharge.  TMs normal bilaterally with no erythema, bulging, or retractions.  Oropharynx clear and moist with no exudate, swelling, or erythema.  Dentition normal.   Neck: Supple, thyroid normal. No bruits, JVD, cervical adenopathy Respiratory: Respiratory effort normal, BS equal bilaterally without rales, rhonchi, wheezing or stridor.  Cardio: RRR without murmurs, rubs or gallops. Brisk peripheral pulses without edema.  Chest: symmetric, with normal excursions Abdomen: Soft, nontender, no guarding, rebound, hernias, masses, or organomegaly. Musculoskeletal: Full ROM all peripheral extremities,5/5 strength, and normal gait.  Skin: Warm, dry without rashes, lesions, ecchymosis. Neuro: A&Ox3, Cranial nerves intact, reflexes equal bilaterally. Normal muscle tone, no cerebellar symptoms. Sensation intact.  Psych: Normal affect, Insight and Judgment appropriate.   Over 40 minutes of exam, counseling, chart review and critical decision making was performed  Terri Piedra 9:51 AM [facility]

## 2017-03-09 NOTE — Patient Instructions (Signed)

## 2017-03-10 LAB — URINALYSIS, ROUTINE W REFLEX MICROSCOPIC

## 2017-03-10 LAB — TSH: TSH: 0.91 mIU/L (ref 0.40–4.50)

## 2017-03-10 LAB — MICROALBUMIN / CREATININE URINE RATIO

## 2017-03-10 LAB — RPR

## 2017-03-10 LAB — INSULIN, RANDOM: Insulin: 25.9 u[IU]/mL — ABNORMAL HIGH (ref 2.0–19.6)

## 2017-03-10 LAB — NEISSERIA GONORRHOEAE, PROBE AMP

## 2017-03-10 LAB — HEMOGLOBIN A1C
HEMOGLOBIN A1C: 4.9 % (ref <5.7)
MEAN PLASMA GLUCOSE: 94 mg/dL

## 2017-03-10 LAB — TESTOSTERONE: TESTOSTERONE: 827 ng/dL (ref 250–827)

## 2017-03-10 LAB — VITAMIN D 25 HYDROXY (VIT D DEFICIENCY, FRACTURES): Vit D, 25-Hydroxy: 79 ng/mL (ref 30–100)

## 2017-03-10 LAB — MAGNESIUM: Magnesium: 1.9 mg/dL (ref 1.5–2.5)

## 2017-03-10 LAB — GC/CHLAMYDIA PROBE AMP

## 2017-03-10 LAB — HIV ANTIBODY (ROUTINE TESTING W REFLEX): HIV: NONREACTIVE

## 2017-03-21 ENCOUNTER — Other Ambulatory Visit: Payer: Self-pay | Admitting: Internal Medicine

## 2017-03-23 ENCOUNTER — Ambulatory Visit (INDEPENDENT_AMBULATORY_CARE_PROVIDER_SITE_OTHER): Payer: Managed Care, Other (non HMO) | Admitting: Physician Assistant

## 2017-03-23 ENCOUNTER — Encounter: Payer: Self-pay | Admitting: Physician Assistant

## 2017-03-23 VITALS — BP 130/70 | HR 95 | Temp 98.4°F | Ht 76.75 in | Wt 280.4 lb

## 2017-03-23 DIAGNOSIS — J0100 Acute maxillary sinusitis, unspecified: Secondary | ICD-10-CM

## 2017-03-23 MED ORDER — AZITHROMYCIN 250 MG PO TABS: ORAL_TABLET | ORAL | 1 refills | Status: AC

## 2017-03-23 MED ORDER — PREDNISONE 20 MG PO TABS: ORAL_TABLET | ORAL | 0 refills | Status: DC

## 2017-03-23 MED ORDER — PROMETHAZINE-DM 6.25-15 MG/5ML PO SYRP: 5.0000 mL | ORAL_SOLUTION | Freq: Four times a day (QID) | ORAL | 1 refills | Status: DC | PRN

## 2017-03-23 NOTE — Progress Notes (Signed)
Subjective:    Patient ID: Spencer Love, male    DOB: 05/16/1986, 31 y.o.   MRN: [id number]  HPI 31 y.o. WM presents for cold symptoms x 5 days.  He has cold symptoms, went to UC got CXR that was negative. He is on tylenol, mucinex, and zyrtec, has albuterol inhaler.   Blood pressure 130/70, pulse 95, temperature 98.4 F (36.9 C), height 6' 4.75" (1.949 m), weight 280 lb 6.4 oz (127.2 kg), SpO2 97 %.  Medications Current Outpatient Prescriptions on File Prior to Visit  Medication Sig  . aspirin EC 81 MG tablet Take 81 mg by mouth 2 (two) times daily.  Marland Kitchen. b complex vitamins tablet Take 1 tablet by mouth daily.  Marland Kitchen. buPROPion (WELLBUTRIN XL) 300 MG 24 hr tablet TAKE 1 TABLET EVERY MORNING FOR MOOD & ADD  . cetirizine (ZYRTEC) 10 MG tablet Take 1 tablet (10 mg total) by mouth daily.  . Cholecalciferol (VITAMIN D3) 5000 units CAPS Take 1 capsule by mouth 2 (two) times daily.  . cloNIDine (CATAPRES) 0.2 MG tablet TAKE 1/2 TO 1 TABLET BY MOUTH 3 TIMES A DAY FOR BLOOD PRESSURE  . fish oil-omega-3 fatty acids 1000 MG capsule Take 1 g by mouth 2 (two) times daily.  . Flaxseed, Linseed, (FLAX SEED OIL PO) Take 1 tablet by mouth 2 (two) times daily.  Marland Kitchen. gabapentin (NEURONTIN) 100 MG capsule TAKE 1 CAPSULE BY MOUTH 3TIMES A DAY FOR BACK PAIN  . hydrochlorothiazide (HYDRODIURIL) 25 MG tablet TAKE 1 TABLET (25 MG TOTAL) BY MOUTH DAILY.  Marland Kitchen. lisinopril (PRINIVIL,ZESTRIL) 20 MG tablet TAKE 1 TABLET BY MOUTH TWICE A DAY  . LORazepam (ATIVAN) 1 MG tablet TAKE 1/2 TO 1 TABLET BY MOUTH UP TO 2 TO 3 TIMES DAILY AS NEEDED FOR ANXIETY   . Magnesium 400 MG TABS Take 400 mg by mouth daily.  . meloxicam (MOBIC) 15 MG tablet TAKE 1 TABLET BY MOUTH ONCE A DAY  . metFORMIN (GLUCOPHAGE) 500 MG tablet TAKE 1 TABLET BY MOUTH TWICE A DAY  . omeprazole (PRILOSEC) 40 MG capsule TAKE 1 CAPSULE BY MOUTH AT BEDTIME FOR HEARTBURN AND ACID REFLUX  . propranolol (INDERAL) 80 MG tablet TAKE 1 TABLET BY MOUTH 3 TIMES DAILY.  .  SYRINGE-NEEDLE, DISP, 3 ML (BD ECLIPSE SYRINGE) 21G X 1" 3 ML MISC Inject 2 ml IM every 2 weeks.  Marland Kitchen. testosterone cypionate (DEPOTESTOSTERONE CYPIONATE) 200 MG/ML injection INJECT 2 ML'S INTO THE MUSCLE EVERY 2 WEEKS  . zinc gluconate 50 MG tablet Take 50 mg by mouth daily.   No current facility-administered medications on file prior to visit.     Problem list He has Essential hypertension; Depression, controlled; Prediabetes; OSA (obstructive sleep apnea); Mixed hyperlipidemia; Medication management; Vitamin D deficiency; Morbid obesity (HCC); Genital warts; GERD; BMI 33.49,  adult; and Testosterone deficiency on his problem list.  Review of Systems  Constitutional: Negative for chills, diaphoresis and fever.  HENT: Positive for congestion, sinus pressure, sneezing and sore throat. Negative for ear pain, trouble swallowing and voice change.   Eyes: Negative.   Respiratory: Positive for cough. Negative for chest tightness, shortness of breath and wheezing.   Cardiovascular: Negative.   Gastrointestinal: Negative.   Genitourinary: Negative.   Musculoskeletal: Negative for neck pain.  Neurological: Negative.  Negative for headaches.       Objective:   Physical Exam  Constitutional: He is oriented to person, place, and time. He appears well-developed and well-nourished.  HENT:  Head: Normocephalic and atraumatic.  Right Ear: Hearing and tympanic membrane normal.  Left Ear: Hearing and tympanic membrane normal.  Nose: Right sinus exhibits maxillary sinus tenderness. Left sinus exhibits maxillary sinus tenderness.  Mouth/Throat: Uvula is midline and mucous membranes are normal. Posterior oropharyngeal erythema present. No oropharyngeal exudate, posterior oropharyngeal edema or tonsillar abscesses.  Eyes: Conjunctivae are normal. Pupils are equal, round, and reactive to light.  Neck: Normal range of motion. Neck supple.  Cardiovascular: Normal rate and regular rhythm.   Pulmonary/Chest:  Effort normal and breath sounds normal.  Abdominal: Soft. Bowel sounds are normal.  Musculoskeletal: Normal range of motion.  Lymphadenopathy:    He has no cervical adenopathy.  Neurological: He is alert and oriented to person, place, and time.  Skin: Skin is warm and dry. No rash noted.      Assessment & Plan:  1. Acute maxillary sinusitis, recurrence not specified Will hold the zpak and take if she is not getting better, increase fluids, rest, cont allergy pill - azithromycin (ZITHROMAX) 250 MG tablet; Take 2 tablets (500 mg) on  Day 1,  followed by 1 tablet (250 mg) once daily on Days 2 through 5.  Dispense: 6 each; Refill: 1 - predniSONE (DELTASONE) 20 MG tablet; 2 tablets daily for 3 days, 1 tablet daily for 4 days.  Dispense: 10 tablet; Refill: 0 - promethazine-dextromethorphan (PROMETHAZINE-DM) 6.25-15 MG/5ML syrup; Take 5 mLs by mouth 4 (four) times daily as needed for cough.  Dispense: 240 mL; Refill: 1  Future Appointments Date Time Provider Department Center  07/14/2017 10:30 AM Lucky Cowboy, MD [id number] None

## 2017-03-23 NOTE — Patient Instructions (Signed)
Make sure you are on an allergy pill, see below for more details. Please take the prednisone as directed below, this is NOT an antibiotic so you do NOT have to finish it. You can take it for a few days and stop it if you are doing better.   Please take the prednisone to help decrease inflammation and therefore decrease symptoms. Take it it with food to avoid GI upset. It can cause increased energy but on the other hand it can make it hard to sleep at night so please take it AT NIGHT WITH DINNER, it takes 8-12 hours to start working so it will NOT affect your sleeping if you take it at night with your food!!  If you are diabetic it will increase your sugars so decrease carbs and monitor your sugars closely.     Your ears and sinuses are connected by the eustachian tube. When your sinuses are inflamed, this can close off the tube and cause fluid to collect in your middle ear. This can then cause dizziness, popping, clicking, ringing, and echoing in your ears. This is often NOT an infection and does NOT require antibiotics, it is caused by inflammation so the treatments help the inflammation. This can take a long time to get better so please be patient.  Here are things you can do to help with this: - Try the Flonase or Nasonex. Remember to spray each nostril twice towards the outer part of your eye.  Do not sniff but instead pinch your nose and tilt your head back to help the medicine get into your sinuses.  The best time to do this is at bedtime.Stop if you get blurred vision or nose bleeds.  -While drinking fluids, pinch and hold nose close and swallow, to help open eustachian tubes to drain fluid behind ear drums. -Please pick one of the over the counter allergy medications below and take it once daily for allergies.  It will also help with fluid behind ear drums. Claritin or loratadine cheapest but likely the weakest  Zyrtec or certizine at night because it can make you sleepy The strongest is allegra  or fexafinadine  Cheapest at [employer], [employer], [employer] -can use decongestant over the counter, please do not use if you have high blood pressure or certain heart conditions.   if worsening HA, changes vision/speech, imbalance, weakness go to the ER  HOW TO TREAT VIRAL COUGH AND COLD SYMPTOMS:  -Symptoms usually last at least 1 week with the worst symptoms being around day 4.  - colds usually start with a sore throat and end with a cough, and the cough can take 2 weeks to get better.  -No antibiotics are needed for colds, flu, sore throats, cough, bronchitis UNLESS symptoms are longer than 7 days OR if you are getting better then get drastically worse.  -There are a lot of combination medications (Dayquil, Nyquil, Vicks 44, tyelnol cold and sinus, ETC). Please look at the ingredients on the back so that you are treating the correct symptoms and not doubling up on medications/ingredients.    Medicines you can use  Nasal congestion  - pseudoephedrine (Sudafed)- behind the counter, do not use if you have high blood pressure, medicine that have -D in them.  - phenylephrine (Sudafed PE) -Dextormethorphan + chlorpheniramine (Coridcidin HBP)- okay if you have high blood pressure -Oxymetazoline (Afrin) nasal spray- LIMIT to 3 days -Saline nasal spray -Neti pot (used distilled or bottled water)  Ear pain/congestion  -pseudoephedrine (sudafed) - Nasonex/flonase   nasal spray  Fever  -Acetaminophen (Tyelnol) -Ibuprofen (Advil, motrin, aleve)  Sore Throat  -Acetaminophen (Tyelnol) -Ibuprofen (Advil, motrin, aleve) -Drink a lot of water -Gargle with salt water - Rest your voice (don't talk) -Throat sprays -Cough drops  Body Aches  -Acetaminophen (Tyelnol) -Ibuprofen (Advil, motrin, aleve)  Headache  -Acetaminophen (Tyelnol) -Ibuprofen (Advil, motrin, aleve) - Exedrin, Exedrin Migraine  Allergy symptoms (cough, sneeze, runny nose, itchy eyes) -Claritin or loratadine cheapest but likely  the weakest  -Zyrtec or certizine at night because it can make you sleepy -The strongest is allegra or fexafinadine  Cheapest at [facility], [employer], [facility]  Cough  -Dextromethorphan (Delsym)- medicine that has DM in it -Guafenesin (Mucinex/Robitussin) - cough drops - drink lots of water  Chest Congestion  -Guafenesin (Mucinex/Robitussin)  Red Itchy Eyes  - Naphcon-A  Upset Stomach  - Bland diet (nothing spicy, greasy, fried, and high acid foods like tomatoes, oranges, berries) -OKAY- cereal, bread, soup, crackers, rice -Eat smaller more frequent meals -reduce caffeine, no alcohol -Loperamide (Imodium-AD) if diarrhea -Prevacid for heart burn  General health when sick  -Hydration -wash your hands frequently -keep surfaces clean -change pillow cases and sheets often -Get fresh air but do not exercise strenuously -Vitamin D, double up on it - Vitamin C -Zinc       

## 2017-03-31 ENCOUNTER — Encounter: Payer: Self-pay | Admitting: Internal Medicine

## 2017-04-01 ENCOUNTER — Encounter: Payer: Self-pay | Admitting: Internal Medicine

## 2017-04-10 ENCOUNTER — Encounter: Payer: Self-pay | Admitting: Internal Medicine

## 2017-04-15 ENCOUNTER — Ambulatory Visit (INDEPENDENT_AMBULATORY_CARE_PROVIDER_SITE_OTHER): Payer: Managed Care, Other (non HMO) | Admitting: Physician Assistant

## 2017-04-15 ENCOUNTER — Encounter: Payer: Self-pay | Admitting: Physician Assistant

## 2017-04-15 VITALS — BP 132/80 | HR 73 | Temp 98.8°F | Resp 14 | Ht 76.75 in | Wt 265.6 lb

## 2017-04-15 DIAGNOSIS — Z113 Encounter for screening for infections with a predominantly sexual mode of transmission: Secondary | ICD-10-CM

## 2017-04-15 NOTE — Progress Notes (Signed)
Subjective:    Patient ID: Spencer Love, male    DOB: 1986-02-04, 31 y.o.   MRN: [id number]  HPI 31 y.o. WM presents with new male sexual partner. Goes every 6 months to the health department. No symptoms at this time. Still with his new partner, dating but states wants testing just in case.   Blood pressure 132/80, pulse 73, temperature 98.8 F (37.1 C), resp. rate 14, height 6' 4.75" (1.949 m), weight 265 lb 9.6 oz (120.5 kg), SpO2 98 %.   Medications Current Outpatient Prescriptions on File Prior to Visit  Medication Sig  . aspirin EC 81 MG tablet Take 81 mg by mouth 2 (two) times daily.  Marland Kitchen b complex vitamins tablet Take 1 tablet by mouth daily.  Marland Kitchen buPROPion (WELLBUTRIN XL) 300 MG 24 hr tablet TAKE 1 TABLET EVERY MORNING FOR MOOD & ADD  . cetirizine (ZYRTEC) 10 MG tablet Take 1 tablet (10 mg total) by mouth daily.  . Cholecalciferol (VITAMIN D3) 5000 units CAPS Take 1 capsule by mouth 2 (two) times daily.  . cloNIDine (CATAPRES) 0.2 MG tablet TAKE 1/2 TO 1 TABLET BY MOUTH 3 TIMES A DAY FOR BLOOD PRESSURE  . fish oil-omega-3 fatty acids 1000 MG capsule Take 1 g by mouth 2 (two) times daily.  . Flaxseed, Linseed, (FLAX SEED OIL PO) Take 1 tablet by mouth 2 (two) times daily.  Marland Kitchen gabapentin (NEURONTIN) 100 MG capsule TAKE 1 CAPSULE BY MOUTH 3TIMES A DAY FOR BACK PAIN  . hydrochlorothiazide (HYDRODIURIL) 25 MG tablet TAKE 1 TABLET (25 MG TOTAL) BY MOUTH DAILY.  Marland Kitchen lisinopril (PRINIVIL,ZESTRIL) 20 MG tablet TAKE 1 TABLET BY MOUTH TWICE A DAY  . LORazepam (ATIVAN) 1 MG tablet TAKE 1/2 TO 1 TABLET BY MOUTH UP TO 2 TO 3 TIMES DAILY AS NEEDED FOR ANXIETY   . Magnesium 400 MG TABS Take 400 mg by mouth daily.  . meloxicam (MOBIC) 15 MG tablet TAKE 1 TABLET BY MOUTH ONCE A DAY  . metFORMIN (GLUCOPHAGE) 500 MG tablet TAKE 1 TABLET BY MOUTH TWICE A DAY  . omeprazole (PRILOSEC) 40 MG capsule TAKE 1 CAPSULE BY MOUTH AT BEDTIME FOR HEARTBURN AND ACID REFLUX  . propranolol (INDERAL) 80 MG tablet  TAKE 1 TABLET BY MOUTH 3 TIMES DAILY.  . SYRINGE-NEEDLE, DISP, 3 ML (BD ECLIPSE SYRINGE) 21G X 1" 3 ML MISC Inject 2 ml IM every 2 weeks.  Marland Kitchen testosterone cypionate (DEPOTESTOSTERONE CYPIONATE) 200 MG/ML injection INJECT 2 ML'S INTO THE MUSCLE EVERY 2 WEEKS  . zinc gluconate 50 MG tablet Take 50 mg by mouth daily.   No current facility-administered medications on file prior to visit.     Problem list He has Essential hypertension; Depression, controlled; Prediabetes; OSA (obstructive sleep apnea); Mixed hyperlipidemia; Medication management; Vitamin D deficiency; Morbid obesity (HCC); Genital warts; GERD; BMI 33.49,  adult; and Testosterone deficiency on his problem list.  Review of Systems  Constitutional: Negative.   HENT: Negative.   Respiratory: Negative.   Cardiovascular: Negative.   Gastrointestinal: Negative.   Genitourinary: Negative.   Musculoskeletal: Negative.   Skin: Negative.   Neurological: Negative.   Psychiatric/Behavioral: Negative.        Objective:   Physical Exam  Constitutional: He is oriented to person, place, and time. He appears well-developed and well-nourished.  HENT:  Head: Normocephalic and atraumatic.  Right Ear: External ear normal.  Left Ear: External ear normal.  Mouth/Throat: Oropharynx is clear and moist.  Eyes: Conjunctivae and EOM are normal. Pupils are  equal, round, and reactive to light.  Neck: Normal range of motion. Neck supple.  Cardiovascular: Normal rate, regular rhythm and normal heart sounds.   Pulmonary/Chest: Effort normal and breath sounds normal.  Abdominal: Soft. Bowel sounds are normal.  Musculoskeletal: Normal range of motion.  Neurological: He is alert and oriented to person, place, and time. No cranial nerve deficit.  Skin: Skin is warm and dry.  Psychiatric: He has a normal mood and affect. His behavior is normal.       Assessment & Plan:  Screening for STDs (sexually transmitted diseases) - RPR - HIV antibody -  HSV(herpes simplex vrs) 1+2 ab-IgG - Hepatitis C antibody - GC/Chlamydia Probe Amp; Future

## 2017-04-16 LAB — RPR

## 2017-04-16 LAB — HIV ANTIBODY (ROUTINE TESTING W REFLEX): HIV: NONREACTIVE

## 2017-04-16 LAB — HSV(HERPES SIMPLEX VRS) I + II AB-IGG: HSV 1 Glycoprotein G Ab, IgG: <0.9 Index (ref <0.90)

## 2017-04-16 LAB — HEPATITIS C ANTIBODY: HCV AB: NEGATIVE

## 2017-04-20 ENCOUNTER — Other Ambulatory Visit: Payer: Self-pay | Admitting: Internal Medicine

## 2017-04-20 MED ORDER — METFORMIN HCL ER 500 MG PO TB24: ORAL_TABLET | ORAL | 1 refills | Status: DC

## 2017-04-20 NOTE — Telephone Encounter (Signed)
Please call Lorazepam 

## 2017-04-22 ENCOUNTER — Encounter: Payer: Self-pay | Admitting: Internal Medicine

## 2017-05-22 ENCOUNTER — Other Ambulatory Visit: Payer: Self-pay | Admitting: Internal Medicine

## 2017-05-22 DIAGNOSIS — F32A Depression, unspecified: Secondary | ICD-10-CM

## 2017-05-22 DIAGNOSIS — K219 Gastro-esophageal reflux disease without esophagitis: Secondary | ICD-10-CM

## 2017-05-22 DIAGNOSIS — F329 Major depressive disorder, single episode, unspecified: Secondary | ICD-10-CM

## 2017-06-09 ENCOUNTER — Other Ambulatory Visit: Payer: Self-pay | Admitting: Internal Medicine

## 2017-06-10 ENCOUNTER — Other Ambulatory Visit: Payer: Self-pay | Admitting: Internal Medicine

## 2017-07-14 ENCOUNTER — Encounter: Payer: Self-pay | Admitting: Internal Medicine

## 2017-07-14 ENCOUNTER — Ambulatory Visit (INDEPENDENT_AMBULATORY_CARE_PROVIDER_SITE_OTHER): Payer: Managed Care, Other (non HMO) | Admitting: Internal Medicine

## 2017-07-14 VITALS — BP 136/82 | HR 80 | Temp 97.2°F | Resp 16 | Ht 76.0 in | Wt 236.8 lb

## 2017-07-14 DIAGNOSIS — R7303 Prediabetes: Secondary | ICD-10-CM

## 2017-07-14 DIAGNOSIS — E559 Vitamin D deficiency, unspecified: Secondary | ICD-10-CM | POA: Diagnosis not present

## 2017-07-14 DIAGNOSIS — Z79899 Other long term (current) drug therapy: Secondary | ICD-10-CM | POA: Diagnosis not present

## 2017-07-14 DIAGNOSIS — I10 Essential (primary) hypertension: Secondary | ICD-10-CM | POA: Diagnosis not present

## 2017-07-14 DIAGNOSIS — E782 Mixed hyperlipidemia: Secondary | ICD-10-CM | POA: Diagnosis not present

## 2017-07-14 LAB — HEPATIC FUNCTION PANEL
ALK PHOS: 46 U/L (ref 40–115)
ALT: 23 U/L (ref 9–46)
AST: 18 U/L (ref 10–40)
Albumin: 4.6 g/dL (ref 3.6–5.1)
BILIRUBIN TOTAL: 0.6 mg/dL (ref 0.2–1.2)
Bilirubin, Direct: 0.1 mg/dL (ref <=0.2)
Indirect Bilirubin: 0.5 mg/dL (ref 0.2–1.2)
TOTAL PROTEIN: 6.6 g/dL (ref 6.1–8.1)

## 2017-07-14 LAB — TSH: TSH: 0.82 mIU/L (ref 0.40–4.50)

## 2017-07-14 LAB — BASIC METABOLIC PANEL WITH GFR
BUN: 18 mg/dL (ref 7–25)
CALCIUM: 9.6 mg/dL (ref 8.6–10.3)
CO2: 26 mmol/L (ref 20–31)
Chloride: 100 mmol/L (ref 98–110)
Creat: 1.01 mg/dL (ref 0.60–1.35)
GFR, Est Non African American: >89 mL/min (ref >=60)
Glucose, Bld: 88 mg/dL (ref 65–99)
Potassium: 4.2 mmol/L (ref 3.5–5.3)
Sodium: 138 mmol/L (ref 135–146)

## 2017-07-14 LAB — LIPID PANEL
CHOLESTEROL: 168 mg/dL (ref <200)
HDL: 26 mg/dL — ABNORMAL LOW (ref >40)
LDL Cholesterol: 105 mg/dL — ABNORMAL HIGH (ref <100)
TRIGLYCERIDES: 184 mg/dL — AB (ref <150)
Total CHOL/HDL Ratio: 6.5 Ratio — ABNORMAL HIGH (ref <5.0)
VLDL: 37 mg/dL — AB (ref <30)

## 2017-07-14 LAB — CBC WITH DIFFERENTIAL/PLATELET
BASOS PCT: 1 %
Basophils Absolute: 65 cells/uL (ref 0–200)
EOS ABS: 130 {cells}/uL (ref 15–500)
Eosinophils Relative: 2 %
HCT: 42.2 % (ref 38.5–50.0)
Hemoglobin: 14.6 g/dL (ref 13.2–17.1)
LYMPHS PCT: 31 %
Lymphs Abs: 2015 cells/uL (ref 850–3900)
MCH: 30.2 pg (ref 27.0–33.0)
MCHC: 34.6 g/dL (ref 32.0–36.0)
MCV: 87.2 fL (ref 80.0–100.0)
MONOS PCT: 9 %
MPV: 10.3 fL (ref 7.5–12.5)
Monocytes Absolute: 585 cells/uL (ref 200–950)
NEUTROS PCT: 57 %
Neutro Abs: 3705 cells/uL (ref 1500–7800)
PLATELETS: 189 10*3/uL (ref 140–400)
RBC: 4.84 MIL/uL (ref 4.20–5.80)
RDW: 13.9 % (ref 11.0–15.0)
WBC: 6.5 10*3/uL (ref 3.8–10.8)

## 2017-07-14 NOTE — Progress Notes (Signed)
This very nice 31 y.o. single WM presents for 3 month follow up with Hypertension, Hyperlipidemia, Pre-Diabetes and Vitamin D Deficiency. Patient has GERD controlled with his meds. Patient also is on CPAP for OSA (and since his 40# weight loss he may be a candidate to stop the CPAP)      Patient is treated for HTN (Jan 2014)& BP has been controlled at home. Today's BP is at goal - 136/82. Patient has had no complaints of any cardiac type chest pain, palpitations, dyspnea/orthopnea/PND, dizziness, claudication, or dependent edema.     Hyperlipidemia is failing control  with diet. Patient has lost 40# over the last 4 months (current weight 237#) with improved dietary choices. Last Lipids were not at goal in March (weight was 278#): Lab Results  Component Value Date   CHOL 165 03/09/2017   HDL 24 (L) 03/09/2017   LDLCALC 111 (H) 03/09/2017   TRIG 150 (H) 03/09/2017   CHOLHDL 6.9 (H) 03/09/2017      Also, the patient has history of PreDiabetes/Insulin Resistance (A1c 5.3% and Insulin 152 in 2015) and patient has been on Metformin for his insulin resistance and to aid with weight loss. He has had no symptoms of reactive hypoglycemia, diabetic polys, paresthesias or visual blurring.  Last A1c was 4.9% with elevated Insulin 25.9.     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 Further, the patient also has history of Vitamin D Deficiency ('13" in Oct 2015 and "24" in Oct 2016) and supplements vitamin D without any suspected side-effects. Last vitamin D was at goal:   Lab Results  Component Value Date   VD25OH 79 03/09/2017   Current Outpatient Prescriptions on File Prior to Visit  Medication Sig  . aspirin EC 81 MG tablet Take 81 mg by mouth 2 (two) times daily.  Marland Kitchen b complex vitamins tablet Take 1 tablet by mouth daily.  Marland Kitchen buPROPion (WELLBUTRIN XL) 300 MG 24 hr tablet TAKE 1  TABLET EVERY MORNING FOR MOOD & ADD  . cetirizine (ZYRTEC) 10 MG tablet Take 1 tablet (10 mg total) by mouth daily.  . Cholecalciferol (VITAMIN D3) 5000 units CAPS Take 1 capsule by mouth 2 (two) times daily.  . cloNIDine (CATAPRES) 0.2 MG tablet TAKE 1/2 TO 1 TABLET BY MOUTH 3 TIMES A DAY FOR BLOOD PRESSURE  . fish oil-omega-3 fatty acids 1000 MG capsule Take 1 g by mouth 2 (two) times daily.  . Flaxseed, Linseed, (FLAX SEED OIL PO) Take 1 tablet by mouth 2 (two) times daily.  Marland Kitchen gabapentin (NEURONTIN) 100 MG capsule TAKE 1 CAPSULE BY MOUTH 3TIMES A DAY FOR BACK PAIN  . hydrochlorothiazide (HYDRODIURIL) 25 MG tablet TAKE 1 TABLET (25 MG TOTAL) BY MOUTH DAILY.  Marland Kitchen lisinopril (PRINIVIL,ZESTRIL) 20 MG tablet TAKE 1 TABLET BY MOUTH TWICE A DAY  . LORazepam (ATIVAN) 1 MG tablet TAKE 1/2 TO 1 TABLET BY MOUTH 2 TO 3 TIMES DAILY AS NEEDED FOR ANXIETY  . Magnesium 400 MG TABS Take 400 mg by mouth daily.  . meloxicam (MOBIC) 15 MG tablet TAKE 1 TABLET BY MOUTH ONCE A DAY  . metFORMIN (GLUCOPHAGE XR) 500 MG 24 hr tablet Take 1 to 2 tablets 2 x / day for Diabetes  . omeprazole (PRILOSEC) 40 MG capsule TAKE 1 CAPSULE BY MOUTH AT BEDTIME FOR HEARTBURN AND ACID REFLUX  . propranolol (  INDERAL) 80 MG tablet TAKE 1 TABLET BY MOUTH 3 TIMES DAILY.  . SYRINGE-NEEDLE, DISP, 3 ML (BD ECLIPSE SYRINGE) 21G X 1" 3 ML MISC Inject 2 ml IM every 2 weeks.  Marland Kitchen testosterone cypionate (DEPOTESTOSTERONE CYPIONATE) 200 MG/ML injection INJECT 2 ML'S INTO THE MUSCLE EVERY 2 WEEKS  . zinc gluconate 50 MG tablet Take 50 mg by mouth daily.   No current facility-administered medications on file prior to visit.    Allergies  Allergen Reactions  . Peanut-Containing Drug Products Anaphylaxis  . Ceclor [Cefaclor] Hives and Swelling  . Sulfa Antibiotics Hives   PMHx:   Past Medical History:  Diagnosis Date  . Anxiety   . Depression   . Hidradenitis suppurativa   . Hypertension   . Prediabetes    Immunization History    Administered Date(s) Administered  . Influenza Split 10/11/2015  . Influenza,inj,Quad PF,36+ Mos 10/13/2014  . PPD Test 10/11/2015  . Tdap 10/11/2015   Past Surgical History:  Procedure Laterality Date  . None     FHx:    Reviewed / unchanged  SHx:    Reviewed / unchanged  Systems Review:  Constitutional: Denies fever, chills, wt changes, headaches, insomnia, fatigue, night sweats, change in appetite. Eyes: Denies redness, blurred vision, diplopia, discharge, itchy, watery eyes.  ENT: Denies discharge, congestion, post nasal drip, epistaxis, sore throat, earache, hearing loss, dental pain, tinnitus, vertigo, sinus pain, snoring.  CV: Denies chest pain, palpitations, irregular heartbeat, syncope, dyspnea, diaphoresis, orthopnea, PND, claudication or edema. Respiratory: denies cough, dyspnea, DOE, pleurisy, hoarseness, laryngitis, wheezing.  Gastrointestinal: Denies dysphagia, odynophagia, heartburn, reflux, water brash, abdominal pain or cramps, nausea, vomiting, bloating, diarrhea, constipation, hematemesis, melena, hematochezia  or hemorrhoids. Genitourinary: Denies dysuria, frequency, urgency, nocturia, hesitancy, discharge, hematuria or flank pain. Musculoskeletal: Denies arthralgias, myalgias, stiffness, jt. swelling, pain, limping or strain/sprain.  Skin: Denies pruritus, rash, hives, warts, acne, eczema or change in skin lesion(s). Neuro: No weakness, tremor, incoordination, spasms, paresthesia or pain. Psychiatric: Denies confusion, memory loss or sensory loss. Endo: Denies change in weight, skin or hair change.  Heme/Lymph: No excessive bleeding, bruising or enlarged lymph nodes.  Physical Exam  BP 136/82   Pulse 80   Temp (!) 97.2 F (36.2 C)   Resp 16   Ht 6\' 4"  (1.93 m)   Wt 236 lb 12.8 oz (107.4 kg)   BMI 28.82 kg/m   Appears well nourished, well groomed  and in no distress.  Eyes: PERRLA, EOMs, conjunctiva no swelling or erythema. Sinuses: No  frontal/maxillary tenderness ENT/Mouth: EAC's clear, TM's nl w/o erythema, bulging. Nares clear w/o erythema, swelling, exudates. Oropharynx clear without erythema or exudates. Oral hygiene is good. Tongue normal, non obstructing. Hearing intact.  Neck: Supple. Thyroid nl. Car 2+/2+ without bruits, nodes or JVD. Chest: Respirations nl with BS clear & equal w/o rales, rhonchi, wheezing or stridor.  Cor: Heart sounds normal w/ regular rate and rhythm without sig. murmurs, gallops, clicks or rubs. Peripheral pulses normal and equal  without edema.  Abdomen: Soft & bowel sounds normal. Non-tender w/o guarding, rebound, hernias, masses or organomegaly.  Lymphatics: Unremarkable.  Musculoskeletal: Full ROM all peripheral extremities, joint stability, 5/5 strength and normal gait.  Skin: Warm, dry without exposed rashes, lesions or ecchymosis apparent.  Neuro: Cranial nerves intact, reflexes equal bilaterally. Sensory-motor testing grossly intact. Tendon reflexes grossly intact.  Pysch: Alert & oriented x 3.  Insight and judgement nl & appropriate. No ideations.  Assessment and Plan:  1. Essential hypertension  -discussed  with patient to try weaning gradually off of his Clonidine.   - Also to check with his [facility] to see if his insurance will cover once/daily Propranolol.  - Continue medication, monitor blood pressure at home.  - Continue DASH diet. Reminder to go to the ER if any CP,  SOB, nausea, dizziness, severe HA, changes vision/speech.  - CBC with Differential/Platelet - BASIC METABOLIC PANEL WITH GFR - Magnesium - TSH  2. Hyperlipidemia, mixed  - Continue diet/meds, exercise,& lifestyle modifications.  - Continue monitor periodic cholesterol/liver & renal functions   - Hepatic function panel - Lipid panel - TSH  3. Prediabetes  - Continue diet, exercise, lifestyle modifications.  - Monitor appropriate labs.  - Hemoglobin A1c - Insulin, random  4. Vitamin D  deficiency  - Continue supplementation.  - VITAMIN D 25 Hydroxy   5. Medication management  - CBC with Differential/Platelet - BASIC METABOLIC PANEL WITH GFR - Hepatic function panel - Magnesium - Lipid panel - TSH - Hemoglobin A1c - Insulin, random - VITAMIN D 25 Hydroxy        Discussed  regular exercise, BP monitoring, weight control to achieve/maintain BMI less than 25 and discussed med and SE's. Recommended labs to assess and monitor clinical status with further disposition pending results of labs. Over 30 minutes of exam, counseling, chart review was performed.

## 2017-07-14 NOTE — Patient Instructions (Signed)

## 2017-07-15 LAB — HEMOGLOBIN A1C
Hgb A1c MFr Bld: 4.7 % (ref <5.7)
MEAN PLASMA GLUCOSE: 88 mg/dL

## 2017-07-15 LAB — MAGNESIUM: MAGNESIUM: 2 mg/dL (ref 1.5–2.5)

## 2017-07-15 LAB — VITAMIN D 25 HYDROXY (VIT D DEFICIENCY, FRACTURES): VIT D 25 HYDROXY: 88 ng/mL (ref 30–100)

## 2017-07-16 LAB — INSULIN, RANDOM: Insulin: 8.8 u[IU]/mL (ref 2.0–19.6)

## 2017-07-17 ENCOUNTER — Encounter: Payer: Self-pay | Admitting: Internal Medicine

## 2017-07-18 ENCOUNTER — Other Ambulatory Visit: Payer: Self-pay | Admitting: Internal Medicine

## 2017-07-31 ENCOUNTER — Other Ambulatory Visit: Payer: Self-pay | Admitting: Internal Medicine

## 2017-07-31 NOTE — Telephone Encounter (Signed)
Please  Call Alpraz 

## 2017-09-30 ENCOUNTER — Other Ambulatory Visit: Payer: Self-pay | Admitting: Internal Medicine

## 2017-09-30 MED ORDER — LORAZEPAM 1 MG PO TABS: ORAL_TABLET | ORAL | 0 refills | Status: DC

## 2017-09-30 NOTE — Telephone Encounter (Signed)
Please call Lorazepam 

## 2017-10-16 ENCOUNTER — Other Ambulatory Visit: Payer: Self-pay | Admitting: Internal Medicine

## 2017-10-16 ENCOUNTER — Encounter: Payer: Self-pay | Admitting: Internal Medicine

## 2017-10-16 MED ORDER — AZITHROMYCIN 250 MG PO TABS: ORAL_TABLET | ORAL | 1 refills | Status: DC

## 2017-11-03 ENCOUNTER — Other Ambulatory Visit: Payer: Self-pay | Admitting: Internal Medicine

## 2017-11-03 NOTE — Telephone Encounter (Signed)
please call Lorazelam

## 2017-11-05 ENCOUNTER — Ambulatory Visit: Payer: Self-pay | Admitting: Internal Medicine

## 2017-11-05 ENCOUNTER — Other Ambulatory Visit: Payer: Self-pay | Admitting: Internal Medicine

## 2017-11-11 NOTE — Progress Notes (Signed)
This very nice 31 y.o. single WM presents for 6 month follow up with Hypertension, Hyperlipidemia, Pre-Diabetes and Vitamin D Deficiency. Pt's GERD is controlled with prudent diet and his PPI. He also has hx/o OSA and after a 40# weight loss, he was able to d/c CPAP.  He has gained back 19# since last OV in July. Wt Readings from Last 3 Encounters:  11/12/17 255 lb (115.7 kg)  07/14/17 236 lb 12.8 oz (107.4 kg)  04/15/17 265 lb 9.6 oz (120.5 kg)      Patient is treated for HTN (12/2012)  & BP has been controlled at home. Today's BP is at goal - 140/74. Patient has had no complaints of any cardiac type chest pain, palpitations, dyspnea / orthopnea / PND, dizziness, claudication, or dependent edema.     Hyperlipidemia is not controlled with diet. Last Lipids were not at goal:  Lab Results  Component Value Date   CHOL 168 07/14/2017   HDL 26 (L) 07/14/2017   LDLCALC 105 (H) 07/14/2017   TRIG 184 (H) 07/14/2017   CHOLHDL 6.5 (H) 07/14/2017      Also, the patient has history of PreDiabetes/Insulin Resistance since 2015 with A1c 5.3% and elevated Insulin 152 and has had no symptoms of reactive hypoglycemia, diabetic polys, paresthesias or visual blurring.  Last A1c was normal & at goal: Lab Results  Component Value Date   HGBA1C 4.7 07/14/2017      Patint also has hx/o Low T "212: / 2016 and "207" / 2017 and has been on D-Testosterone injection, but has stopped due to perceived lack of benefits.        Further, the patient also has history of Vitamin D Deficiency ("23" in 10/2014 and "24" in Oct 2016)  and supplements vitamin D without any suspected side-effects. Last vitamin D was at goal:   Lab Results  Component Value Date   VD25OH 88 07/14/2017   Current Outpatient Medications on File Prior to Visit  Medication Sig  . aspirin EC 81 MG  Take 2 x daily.  Marland Kitchen. b complex vitamins  Take 1 tab daily.  Marland Kitchen. buPROPion- XL 300 MG  TAKE 1 TAB EVERY MORNING  . cetirizine  10 MG  Take 1 tab  daily.  Marland Kitchen. VITAMIN D 5000 units  Take 1 cap 2 x daily.  . cloNIDine 0.2 MG  TAKE 1/2 - 1 TAB 3 TIMES A DAY F  . fish oil-omega-3 1000 MG  Take 1 g  2 x daily.  Marland Kitchen. FLAX SEED  Take 1 tab 2 x daily.  . hctz 25 MG  TAKE 1 TAB DAILY.  Marland Kitchen. lisinopril  20 MG TAKE 1 TAB TWICE A DAY  . LORazepam 1 MG  Take 1/2-1 Tabl  2 - 3 x /  ONLY if  Needed for  Anxiety  . Magnesium 400 MG  Take daily.  . meloxicam  15 MG  TAKE 1 TAB ONCE A DAY  . omeprazole  40 MG  TAKE 1 CAP AT BEDTIME  . propranolol  80 MG  TAKE 1 TAB 3 TIMES DAILY.  Marland Kitchen. zinc 50 MG Take  daily.  . metFORMIN-XR 500  Take 1 to 2 tab 2 x / day for Diabetes    Allergies  Allergen Reactions  . Peanut-Containing Drug Products Anaphylaxis  . Ceclor [Cefaclor] Hives and Swelling  . Sulfa Antibiotics Hives   PMHx:   Past Medical History:  Diagnosis Date  . Anxiety   .  Depression   . Hidradenitis suppurativa   . Hypertension   . Prediabetes    Immunization History  Administered Date(s) Administered  . Influenza Split 10/11/2015  . Influenza,inj,Quad PF,6+ Mos 10/13/2014  . PPD Test 10/11/2015  . Tdap 10/11/2015   Past Surgical History:  Procedure Laterality Date  . None     FHx:    Reviewed / unchanged  SHx:    Reviewed / unchanged  Systems Review:  Constitutional: Denies fever, chills, wt changes, headaches, insomnia, fatigue, night sweats, change in appetite. Eyes: Denies redness, blurred vision, diplopia, discharge, itchy, watery eyes.  ENT: Denies discharge, congestion, post nasal drip, epistaxis, sore throat, earache, hearing loss, dental pain, tinnitus, vertigo, sinus pain, snoring.  CV: Denies chest pain, palpitations, irregular heartbeat, syncope, dyspnea, diaphoresis, orthopnea, PND, claudication or edema. Respiratory: denies cough, dyspnea, DOE, pleurisy, hoarseness, laryngitis, wheezing.  Gastrointestinal: Denies dysphagia, odynophagia, heartburn, reflux, water brash, abdominal pain or cramps, nausea, vomiting, bloating,  diarrhea, constipation, hematemesis, melena, hematochezia  or hemorrhoids. Genitourinary: Denies dysuria, frequency, urgency, nocturia, hesitancy, discharge, hematuria or flank pain. Musculoskeletal: Denies arthralgias, myalgias, stiffness, jt. swelling, pain, limping or strain/sprain.  Skin: Denies pruritus, rash, hives, warts, acne, eczema or change in skin lesion(s). Neuro: No weakness, tremor, incoordination, spasms, paresthesia or pain. Psychiatric: Denies confusion, memory loss or sensory loss. Endo: Denies change in weight, skin or hair change.  Heme/Lymph: No excessive bleeding, bruising or enlarged lymph nodes.  Physical Exam  BP 140/74   Pulse 73   Temp (!) 97.5 F (36.4 C)   Ht 6\' 4"  (1.93 m)   Wt 255 lb (115.7 kg)   SpO2 97%   BMI 31.04 kg/m   Appears well nourished, well groomed  and in no distress.  Eyes: PERRLA, EOMs, conjunctiva no swelling or erythema. Sinuses: No frontal/maxillary tenderness ENT/Mouth: EAC's clear, TM's nl w/o erythema, bulging. Nares clear w/o erythema, swelling, exudates. Oropharynx clear without erythema or exudates. Oral hygiene is good. Tongue normal, non obstructing. Hearing intact.  Neck: Supple. Thyroid nl. Car 2+/2+ without bruits, nodes or JVD. Chest: Respirations nl with BS clear & equal w/o rales, rhonchi, wheezing or stridor.  Cor: Heart sounds normal w/ regular rate and rhythm without sig. murmurs, gallops, clicks or rubs. Peripheral pulses normal and equal  without edema.  Abdomen: Soft & bowel sounds normal. Non-tender w/o guarding, rebound, hernias, masses or organomegaly.  Lymphatics: Unremarkable.  Musculoskeletal: Full ROM all peripheral extremities, joint stability, 5/5 strength and normal gait.  Skin: Warm, dry without exposed rashes, lesions or ecchymosis apparent.  Neuro: Cranial nerves intact, reflexes equal bilaterally. Sensory-motor testing grossly intact. Tendon reflexes grossly intact.  Pysch: Alert & oriented x 3.   Insight and judgement nl & appropriate. No ideations.  Assessment and Plan:  1. Essential hypertension  - Continue medication, monitor blood pressure at home.  - Continue DASH diet. Reminder to go to the ER if any CP,  SOB, nausea, dizziness, severe HA, changes vision/speech.\  - CBC with Differential/Platelet - BASIC METABOLIC PANEL WITH GFR - Magnesium - TSH  2. Hyperlipidemia, mixed  - Continue diet/meds, exercise,& lifestyle modifications.  - Continue monitor periodic cholesterol/liver & renal functions   - Hepatic function panel - Lipid panel - TSH  3. Prediabetes  - Continue diet, exercise, lifestyle modifications.  - Monitor appropriate labs.  - Hemoglobin A1c - Insulin, random  4. Vitamin D deficiency  - Continue supplementation.  - VITAMIN D 25 Hydroxy  5. Testosterone deficiency  - Testosterone  6.  Gastroesophageal reflux disease  - CBC with Differential/Platelet  7. Medication management  - CBC with Differential/Platelet - BASIC METABOLIC PANEL WITH GFR - Hepatic function panel - Magnesium - Lipid panel - TSH - Hemoglobin A1c - Insulin, random - VITAMIN D 25 Hydroxy - Testosterone          Discussed  regular exercise, BP monitoring, weight control to achieve/maintain BMI less than 25 and discussed med and SE's. Recommended labs to assess and monitor clinical status with further disposition pending results of labs. Over 30 minutes of exam, counseling, chart review was performed.

## 2017-11-11 NOTE — Patient Instructions (Signed)

## 2017-11-12 ENCOUNTER — Encounter: Payer: Self-pay | Admitting: Internal Medicine

## 2017-11-12 ENCOUNTER — Ambulatory Visit: Payer: Managed Care, Other (non HMO) | Admitting: Internal Medicine

## 2017-11-12 ENCOUNTER — Other Ambulatory Visit: Payer: Self-pay | Admitting: Internal Medicine

## 2017-11-12 VITALS — BP 140/74 | HR 73 | Temp 97.5°F | Ht 76.0 in | Wt 255.0 lb

## 2017-11-12 DIAGNOSIS — Z79899 Other long term (current) drug therapy: Secondary | ICD-10-CM

## 2017-11-12 DIAGNOSIS — E782 Mixed hyperlipidemia: Secondary | ICD-10-CM | POA: Diagnosis not present

## 2017-11-12 DIAGNOSIS — K219 Gastro-esophageal reflux disease without esophagitis: Secondary | ICD-10-CM

## 2017-11-12 DIAGNOSIS — I10 Essential (primary) hypertension: Secondary | ICD-10-CM | POA: Diagnosis not present

## 2017-11-12 DIAGNOSIS — E559 Vitamin D deficiency, unspecified: Secondary | ICD-10-CM

## 2017-11-12 DIAGNOSIS — E349 Endocrine disorder, unspecified: Secondary | ICD-10-CM | POA: Diagnosis not present

## 2017-11-12 DIAGNOSIS — R7303 Prediabetes: Secondary | ICD-10-CM

## 2017-11-13 LAB — INSULIN, RANDOM: Insulin: 18.2 u[IU]/mL (ref 2.0–19.6)

## 2017-11-13 LAB — CBC WITH DIFFERENTIAL/PLATELET
BASOS ABS: 52 {cells}/uL (ref 0–200)
BASOS PCT: 0.9 %
EOS ABS: 203 {cells}/uL (ref 15–500)
Eosinophils Relative: 3.5 %
HEMATOCRIT: 41.4 % (ref 38.5–50.0)
Hemoglobin: 14.5 g/dL (ref 13.2–17.1)
LYMPHS ABS: 2111 {cells}/uL (ref 850–3900)
MCH: 30.1 pg (ref 27.0–33.0)
MCHC: 35 g/dL (ref 32.0–36.0)
MCV: 86.1 fL (ref 80.0–100.0)
MPV: 11.4 fL (ref 7.5–12.5)
Monocytes Relative: 7.8 %
NEUTROS ABS: 2981 {cells}/uL (ref 1500–7800)
Neutrophils Relative %: 51.4 %
PLATELETS: 159 10*3/uL (ref 140–400)
RBC: 4.81 10*6/uL (ref 4.20–5.80)
RDW: 13 % (ref 11.0–15.0)
Total Lymphocyte: 36.4 %
WBC: 5.8 10*3/uL (ref 3.8–10.8)
WBCMIX: 452 {cells}/uL (ref 200–950)

## 2017-11-13 LAB — HEPATIC FUNCTION PANEL
AG RATIO: 2.1 (calc) (ref 1.0–2.5)
ALT: 22 U/L (ref 9–46)
AST: 18 U/L (ref 10–40)
Albumin: 4.4 g/dL (ref 3.6–5.1)
Alkaline phosphatase (APISO): 49 U/L (ref 40–115)
BILIRUBIN DIRECT: 0.1 mg/dL (ref 0.0–0.2)
BILIRUBIN TOTAL: 0.4 mg/dL (ref 0.2–1.2)
GLOBULIN: 2.1 g/dL (ref 1.9–3.7)
Indirect Bilirubin: 0.3 mg/dL (calc) (ref 0.2–1.2)
Total Protein: 6.5 g/dL (ref 6.1–8.1)

## 2017-11-13 LAB — TESTOSTERONE: Testosterone: 298 ng/dL (ref 250–827)

## 2017-11-13 LAB — HEMOGLOBIN A1C
HEMOGLOBIN A1C: 4.7 %{Hb} (ref <5.7)
Mean Plasma Glucose: 88 (calc)
eAG (mmol/L): 4.9 (calc)

## 2017-11-13 LAB — MAGNESIUM: MAGNESIUM: 2 mg/dL (ref 1.5–2.5)

## 2017-11-13 LAB — BASIC METABOLIC PANEL WITH GFR
BUN: 15 mg/dL (ref 7–25)
CHLORIDE: 103 mmol/L (ref 98–110)
CO2: 28 mmol/L (ref 20–32)
Calcium: 9 mg/dL (ref 8.6–10.3)
Creat: 0.89 mg/dL (ref 0.60–1.35)
GFR, EST AFRICAN AMERICAN: 134 mL/min/{1.73_m2} (ref >=60)
GFR, EST NON AFRICAN AMERICAN: 116 mL/min/{1.73_m2} (ref >=60)
Glucose, Bld: 101 mg/dL — ABNORMAL HIGH (ref 65–99)
Potassium: 3.6 mmol/L (ref 3.5–5.3)
Sodium: 139 mmol/L (ref 135–146)

## 2017-11-13 LAB — LIPID PANEL
CHOL/HDL RATIO: 6.4 (calc) — AB (ref <5.0)
Cholesterol: 192 mg/dL (ref <200)
HDL: 30 mg/dL — ABNORMAL LOW (ref >40)
LDL CHOLESTEROL (CALC): 122 mg/dL — AB
NON-HDL CHOLESTEROL (CALC): 162 mg/dL — AB (ref <130)
Triglycerides: 261 mg/dL — ABNORMAL HIGH (ref <150)

## 2017-11-13 LAB — TSH: TSH: 0.76 m[IU]/L (ref 0.40–4.50)

## 2017-11-13 LAB — VITAMIN D 25 HYDROXY (VIT D DEFICIENCY, FRACTURES): VIT D 25 HYDROXY: 52 ng/mL (ref 30–100)

## 2017-11-16 ENCOUNTER — Ambulatory Visit: Payer: Self-pay | Admitting: Internal Medicine

## 2017-12-02 ENCOUNTER — Other Ambulatory Visit: Payer: Self-pay | Admitting: Internal Medicine

## 2017-12-04 ENCOUNTER — Encounter: Payer: Self-pay | Admitting: Internal Medicine

## 2017-12-09 ENCOUNTER — Other Ambulatory Visit: Payer: Self-pay | Admitting: Internal Medicine

## 2017-12-09 DIAGNOSIS — K219 Gastro-esophageal reflux disease without esophagitis: Secondary | ICD-10-CM

## 2018-01-06 ENCOUNTER — Other Ambulatory Visit: Payer: Self-pay | Admitting: Internal Medicine

## 2018-01-16 ENCOUNTER — Other Ambulatory Visit: Payer: Self-pay | Admitting: Internal Medicine

## 2018-01-16 DIAGNOSIS — F32A Depression, unspecified: Secondary | ICD-10-CM

## 2018-01-16 DIAGNOSIS — F329 Major depressive disorder, single episode, unspecified: Secondary | ICD-10-CM

## 2018-01-20 ENCOUNTER — Other Ambulatory Visit: Payer: Self-pay | Admitting: Internal Medicine

## 2018-01-29 ENCOUNTER — Encounter (INDEPENDENT_AMBULATORY_CARE_PROVIDER_SITE_OTHER): Payer: Self-pay

## 2018-01-29 ENCOUNTER — Other Ambulatory Visit: Payer: Self-pay | Admitting: Internal Medicine

## 2018-01-29 DIAGNOSIS — F419 Anxiety disorder, unspecified: Secondary | ICD-10-CM

## 2018-01-29 MED ORDER — CITALOPRAM HYDROBROMIDE 40 MG PO TABS: 40.0000 mg | ORAL_TABLET | Freq: Every day | ORAL | 1 refills | Status: DC

## 2018-01-29 MED ORDER — LORAZEPAM 1 MG PO TABS: ORAL_TABLET | ORAL | 0 refills | Status: DC

## 2018-01-30 ENCOUNTER — Encounter (INDEPENDENT_AMBULATORY_CARE_PROVIDER_SITE_OTHER): Payer: Self-pay

## 2018-01-30 ENCOUNTER — Other Ambulatory Visit: Payer: Self-pay | Admitting: Internal Medicine

## 2018-01-30 DIAGNOSIS — F419 Anxiety disorder, unspecified: Secondary | ICD-10-CM

## 2018-01-30 MED ORDER — LORAZEPAM 1 MG PO TABS: ORAL_TABLET | ORAL | 0 refills | Status: DC

## 2018-01-30 MED ORDER — CITALOPRAM HYDROBROMIDE 40 MG PO TABS: 40.0000 mg | ORAL_TABLET | Freq: Every day | ORAL | 1 refills | Status: DC

## 2018-02-02 ENCOUNTER — Encounter (INDEPENDENT_AMBULATORY_CARE_PROVIDER_SITE_OTHER): Payer: Self-pay

## 2018-02-02 ENCOUNTER — Other Ambulatory Visit: Payer: Self-pay | Admitting: Internal Medicine

## 2018-02-02 DIAGNOSIS — F419 Anxiety disorder, unspecified: Secondary | ICD-10-CM

## 2018-02-02 MED ORDER — LORAZEPAM 1 MG PO TABS: ORAL_TABLET | ORAL | 0 refills | Status: DC

## 2018-02-03 ENCOUNTER — Telehealth: Payer: Self-pay | Admitting: *Deleted

## 2018-02-03 NOTE — Telephone Encounter (Signed)
Patient called and explained he picked up his Lorazepam 1 mg #70 at [facility] on [address], since [facility] told him they did not have a refill of the medication available from Dr Oneta RackMcKeown, as of 02/03/2018.

## 2018-02-20 ENCOUNTER — Encounter (INDEPENDENT_AMBULATORY_CARE_PROVIDER_SITE_OTHER): Payer: Self-pay

## 2018-02-21 ENCOUNTER — Other Ambulatory Visit: Payer: Self-pay | Admitting: Internal Medicine

## 2018-02-21 DIAGNOSIS — K921 Melena: Secondary | ICD-10-CM

## 2018-02-22 ENCOUNTER — Encounter: Payer: Self-pay | Admitting: Gastroenterology

## 2018-02-27 ENCOUNTER — Other Ambulatory Visit: Payer: Self-pay | Admitting: Internal Medicine

## 2018-03-20 NOTE — Progress Notes (Signed)
       N  O  S  H  O  W                 This very nice 32 y.o. single WM presents for a Screening/Preventative Visit & comprehensive evaluation and management of multiple medical co-morbidities.  Patient has been followed for HTN, HLD, Prediabetes/Insulin Resisrance and Vitamin D Deficiency. Patient also has GERD controlled with meds.  Patient is on CPAP for OSA.      HTN predates since Jan 2014. Patient's BP has been controlled at home.  Today's  . Patient denies any cardiac symptoms as chest pain, palpitations, shortness of breath, dizziness or ankle swelling.     Patient's hyperlipidemia is not  controlled despite losing 40# from 278# to 237# last year. Patient is still attempting dietary control. Last lipids were not at goal: Lab Results  Component Value Date   CHOL 192 11/12/2017   HDL 30 (L) 11/12/2017   LDLCALC 122 (H) 11/12/2017   TRIG 261 (H) 11/12/2017   CHOLHDL 6.4 (H) 11/12/2017      Patient has prediabetes/Insulin Resistance (A1c 5.3%/Insulin 152 when his weight was  Since 297# - BMI 36+ and he was started on Metformin to aid with weight loss.     He denies reactive hypoglycemic symptoms, visual blurring, diabetic polys or paresthesias. Last A1c was  Lab Results  Component Value Date   HGBA1C 4.7 11/12/2017       Patient was dx'd Low Testosterone ("212") in 2016 and has been on replacement with improved sense of well being. Finally, patient has history of Vitamin D Deficiency ("23"/2015 and "24"/2016)   and last vitamin D was nearer goal (70-100): Lab Results  Component Value Date   VD25OH 52 11/12/2017

## 2018-03-22 ENCOUNTER — Ambulatory Visit: Payer: Self-pay | Admitting: Internal Medicine

## 2018-03-22 DIAGNOSIS — Z0000 Encounter for general adult medical examination without abnormal findings: Secondary | ICD-10-CM

## 2018-03-29 ENCOUNTER — Other Ambulatory Visit: Payer: Self-pay | Admitting: Internal Medicine

## 2018-03-29 ENCOUNTER — Other Ambulatory Visit: Payer: Self-pay | Admitting: Adult Health

## 2018-03-29 DIAGNOSIS — K219 Gastro-esophageal reflux disease without esophagitis: Secondary | ICD-10-CM

## 2018-03-31 ENCOUNTER — Encounter: Payer: Self-pay | Admitting: Adult Health

## 2018-03-31 ENCOUNTER — Ambulatory Visit (INDEPENDENT_AMBULATORY_CARE_PROVIDER_SITE_OTHER): Payer: Managed Care, Other (non HMO) | Admitting: Adult Health

## 2018-03-31 VITALS — BP 120/78 | HR 85 | Temp 97.3°F | Ht 76.0 in

## 2018-03-31 DIAGNOSIS — S93492A Sprain of other ligament of left ankle, initial encounter: Secondary | ICD-10-CM

## 2018-03-31 NOTE — Progress Notes (Signed)
Subjective:    Spencer Love is a 32 y.o. male who presents with left ankle pain.  Onset of the symptoms was today. Inciting event: plantarflexed while walking down front steps (slipped on ice).. Current symptoms include ability to bear weight, but with some pain, and mild acute swelling on lateral dorsal He has not attempted significant walking and presents with crutches.  Aggravating symptoms: standing and walking. Patient's course of pain: started with onset of injury and has been constant. Patient has had no prior ankle problems. Previous visits for this problem: none.  Evaluation to date: none. Treatment to date: none (avoiding weight bearing).    Review of Systems Pertinent items are noted in HPI.    Objective:    BP 120/78   Pulse 85   Temp (!) 97.3 F (36.3 C)   Ht 6\' 4"  (1.93 m)   SpO2 98%   BMI 31.04 kg/m  Right ankle:  normal  Left ankle:  negative findings: no erythema, no ecchymosis, no tenderness, no tenderness over medial malleolus on the left, no effusion and no ligamentous laxity.   positive findings: Pain with plantarflexion and weight bearing without laxity. Mild swelling over anterior talofibular ligament area.   Imaging: X-ray: Deferred, suspect sprain   Assessment:    Sprain of anterior talofibular ligament of left ankle, initial encounter    Plan:    Natural history and expected course discussed. Questions answered. Rest, ice, compression, elevation (RICE) therapy. Crutches and instructions provided. Transport plannerducational materials distributed. Fit with ankle brace for use over next several weeks (boot to wear while at work - advised may need light duty for next several weeks).  OTC analgesics as needed.   Gradually increase weight bearing after 2 weeks; notify office if notes any weakness or symptoms not improving and can refer to ortho for further eval.

## 2018-03-31 NOTE — Patient Instructions (Signed)
Elevate, apply ice, take tylenol/ibuprofen for pain  Wear walking boot while weight bearing (while at work), gradually try to increase weight bearing after 14 days or so as tolerated -    Ankle Sprain An ankle sprain is a stretch or tear in one of the tough, fiber-like tissues (ligaments) in the ankle. The ligaments in your ankle help to hold the bones of the ankle together. What are the causes? This condition is often caused by stepping on or falling on the outer edge of the foot. What increases the risk? This condition is more likely to develop in people who play sports. What are the signs or symptoms? Symptoms of this condition include:  Pain in your ankle.  Swelling.  Bruising. Bruising may develop right after you sprain your ankle or 1-2 days later.  Trouble standing or walking, especially when you turn or change directions.  How is this diagnosed? This condition is diagnosed with a physical exam. During the exam, your health care provider will press on certain parts of your foot and ankle and try to move them in certain ways. X-rays may be taken to see how severe the sprain is and to check for broken bones. How is this treated? This condition may be treated with:  A brace. This is used to keep the ankle from moving until it heals.  An elastic bandage. This is used to support the ankle.  Crutches.  Pain medicine.  Surgery. This may be needed if the sprain is severe.  Physical therapy. This may help to improve the range of motion in the ankle.  Follow these instructions at home:  Rest your ankle.  Take over-the-counter and prescription medicines only as told by your health care provider.  For 2-3 days, keep your ankle raised (elevated) above the level of your heart as much as possible.  If directed, apply ice to the area: ? Put ice in a plastic bag. ? Place a towel between your skin and the bag. ? Leave the ice on for 20 minutes, 2-3 times a day.  If you were  given a brace: ? Wear it as directed. ? Remove it to shower or bathe. ? Try not to move your ankle much, but wiggle your toes from time to time. This helps to prevent swelling.  If you were given an elastic bandage (dressing): ? Remove it to shower or bathe. ? Try not to move your ankle much, but wiggle your toes from time to time. This helps to prevent swelling. ? Adjust the dressing to make it more comfortable if it feels too tight. ? Loosen the dressing if you have numbness or tingling in your foot, or if your foot becomes cold and blue.  If you have crutches, use them as told by your health care provider. Continue to use them until you can walk without feeling pain in your ankle. Contact a health care provider if:  You have rapidly increasing bruising or swelling.  Your pain is not relieved with medicine. Get help right away if:  Your toes or foot becomes numb or blue.  You have severe pain that gets worse. This information is not intended to replace advice given to you by your health care provider. Make sure you discuss any questions you have with your health care provider. Document Released: 12/15/2005 Document Revised: 01/22/2017 Document Reviewed: 07/17/2015 Elsevier Interactive Patient Education  [employer]2018 [employer]

## 2018-04-01 ENCOUNTER — Other Ambulatory Visit: Payer: Self-pay | Admitting: Adult Health

## 2018-04-01 DIAGNOSIS — K219 Gastro-esophageal reflux disease without esophagitis: Secondary | ICD-10-CM

## 2018-04-01 MED ORDER — OMEPRAZOLE 20 MG PO CPDR: 40.0000 mg | DELAYED_RELEASE_CAPSULE | Freq: Every day | ORAL | 1 refills | Status: DC

## 2018-04-05 ENCOUNTER — Other Ambulatory Visit: Payer: Self-pay | Admitting: Adult Health

## 2018-04-05 DIAGNOSIS — M25572 Pain in left ankle and joints of left foot: Secondary | ICD-10-CM

## 2018-04-05 NOTE — Progress Notes (Signed)
Patient requesting FMLA paperwork for 4 weeks for recent left ankle pain; 2 weeks will be provided for initial avoidance of weight bearing for suspected sprain of ankle ligaments. Will refer to ortho for further evaluation and workup/treatment as needed and recommendation for progression of weight bearing.

## 2018-04-13 ENCOUNTER — Other Ambulatory Visit: Payer: Self-pay | Admitting: Internal Medicine

## 2018-04-13 DIAGNOSIS — F419 Anxiety disorder, unspecified: Secondary | ICD-10-CM

## 2018-04-21 ENCOUNTER — Encounter: Payer: Self-pay | Admitting: Gastroenterology

## 2018-04-21 ENCOUNTER — Encounter

## 2018-04-21 ENCOUNTER — Ambulatory Visit: Payer: Managed Care, Other (non HMO) | Admitting: Gastroenterology

## 2018-04-21 VITALS — BP 118/80 | HR 70 | Ht 77.0 in | Wt 293.0 lb

## 2018-04-21 DIAGNOSIS — K625 Hemorrhage of anus and rectum: Secondary | ICD-10-CM

## 2018-04-21 DIAGNOSIS — R140 Abdominal distension (gaseous): Secondary | ICD-10-CM

## 2018-04-21 DIAGNOSIS — R194 Change in bowel habit: Secondary | ICD-10-CM | POA: Diagnosis not present

## 2018-04-21 MED ORDER — SUPREP BOWEL PREP KIT 17.5-3.13-1.6 GM/177ML PO SOLN: ORAL | 0 refills | Status: DC

## 2018-04-21 NOTE — Progress Notes (Signed)
HPI :  32 year old male with a history of anxiety, hypertension, prediabetes, referred by [name], William, MD for changes in bowel habits.  He has had blood in his stool since September. He had a few episodes in September and then again had passed more recently a lot of blood which was painless. He had he has this sporadically, but has not had symptoms of bleeding now a little while. He has roughly 2-3 BMs per day. He has some urgency along with loose stools at times. He also has increased gas and bloating as well. He denies any nausea or vomiting. She has a history of reflux and states it is well controlled on omeprazole currently. He denies any dysphagia. He does have some mid to lower abdominal cramping which is sporadic, usually associated with a bowel movement in a bowel movement can help relieve it. His sister has ulcerative colitis. His aunt had colon cancer. He is never had a prior colonoscopy. He's been on Mobic 15 mg once a day for the past 1-2 years. 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  Past Medical History:  Diagnosis Date  . Anxiety   . Depression   . Hidradenitis suppurativa   . Hypertension   . Prediabetes      Past Surgical History:  Procedure Laterality Date  . None     Family History  Problem Relation Age of Onset  . Heart disease Maternal Grandmother        Died from bypass surgery  . Heart disease Mother        Bicuspid aortic valve  . Heart disease Sister        Bicuspid aortic valve  . Colitis Sister   . Diabetes Sister   . Heart disease Father        Bicuspid aortic valve  . Breast cancer Paternal Grandmother   . Diabetes Paternal Grandmother   . Esophageal cancer Paternal Grandfather   . Colon cancer Paternal Aunt    Social History   Tobacco Use  . Smoking status: Current Every Day Smoker    Packs/day: 0.60    Years: 10.00      Pack years: 6.00    Types: Cigarettes    Last attempt to quit: 09/20/2016    Years since quitting: 1.5  . Smokeless tobacco: Never Used  Substance Use Topics  . Alcohol use: Yes    Comment: social  . Drug use: Yes    Types: Marijuana   Current Outpatient Medications  Medication Sig Dispense Refill  . aspirin EC 81 MG tablet Take 81 mg by mouth 2 (two) times daily.    Marland Kitchen. b complex vitamins tablet Take 1 tablet by mouth daily.    Marland Kitchen. buPROPion (WELLBUTRIN XL) 300 MG 24 hr tablet TAKE 1 TABLET BY MOUTH EVERY MORNING FOR MOOD AND ADD  90 tablet 1  . cetirizine (ZYRTEC) 10 MG tablet Take 1 tablet (10 mg total) by mouth daily. 30 tablet 0  . Cholecalciferol (VITAMIN D3) 5000 units CAPS Take 1 capsule by mouth 2 (two) times daily.    . citalopram (CELEXA) 40 MG tablet Take 1 tablet (40 mg total) by mouth daily. 90 tablet 1  . cloNIDine (CATAPRES) 0.2 MG tablet take 1/2 to 1 tablet by mouth 3 times daily for blood pressure 270 tablet 0  . fish oil-omega-3 fatty acids  1000 MG capsule Take 1 g by mouth 2 (two) times daily.    . Flaxseed, Linseed, (FLAX SEED OIL PO) Take 1 tablet by mouth 2 (two) times daily.    Marland Kitchen gabapentin (NEURONTIN) 100 MG capsule TAKE ONE CAPSULE BY MOUTH THREE TIMES DAILY FOR BACK PAIN 270 capsule 0  . hydrochlorothiazide (HYDRODIURIL) 25 MG tablet TAKE ONE TABLET BY MOUTH ONE TIME DAILY  90 tablet 0  . lisinopril (PRINIVIL,ZESTRIL) 20 MG tablet TAKE ONE TABLET BY MOUTH TWICE DAILY  180 tablet 1  . LORazepam (ATIVAN) 1 MG tablet take 1/2 to 1 tablet by mouth 2 to 3 times daily *only take for acute anxiety attacks AND limit to 5 days a week* 70 tablet 0  . Magnesium 400 MG TABS Take 400 mg by mouth daily.    . meloxicam (MOBIC) 15 MG tablet TAKE ONE TABLET BY MOUTH ONE TIME DAILY  90 tablet 0  . metFORMIN (GLUCOPHAGE-XR) 500 MG 24 hr tablet TAKE ONE TO TWO TABLETS BY MOUTH TWICE DAILY FOR DIABETES 360 tablet 1  . omeprazole (PRILOSEC) 20 MG capsule Take 2 capsules (40 mg  total) by mouth at bedtime. 168 capsule 1  . propranolol (INDERAL) 80 MG tablet TAKE 1 TABLET BY MOUTH 3 TIMES DAILY 270 tablet 0  . zinc gluconate 50 MG tablet Take 50 mg by mouth daily.     No current facility-administered medications for this visit.    Allergies  Allergen Reactions  . Peanut-Containing Drug Products Anaphylaxis  . Ceclor [Cefaclor] Hives and Swelling  . Sulfa Antibiotics Hives     Review of Systems: All systems reviewed and negative except where noted in HPI.   Lab Results  Component Value Date   WBC 5.8 11/12/2017   HGB 14.5 11/12/2017   HCT 41.4 11/12/2017   MCV 86.1 11/12/2017   PLT 159 11/12/2017    Lab Results  Component Value Date   CREATININE 0.89 11/12/2017   BUN 15 11/12/2017   NA 139 11/12/2017   K 3.6 11/12/2017   CL 103 11/12/2017   CO2 28 11/12/2017    Lab Results  Component Value Date   ALT 22 11/12/2017   AST 18 11/12/2017   ALKPHOS 46 07/14/2017   BILITOT 0.4 11/12/2017     Physical Exam: BP 118/80   Pulse 70   Ht 6\' 5"  (1.956 m)   Wt 293 lb (132.9 kg) Comment: pt weighed with boot on left foot due to sprain  BMI 34.74 kg/m  Constitutional: Pleasant,well-developed, male in no acute distress. HEENT: Normocephalic and atraumatic. Conjunctivae are normal. No scleral icterus. Neck supple.  Cardiovascular: Normal rate, regular rhythm.  Pulmonary/chest: Effort normal and breath sounds normal. No wheezing, rales or rhonchi. Abdominal: Soft, nondistended, nontender.  There are no masses palpable. No hepatomegaly. Extremities: no edema Lymphadenopathy: No cervical adenopathy noted. Neurological: Alert and oriented to person place and time. Skin: Skin is warm and dry. No rashes noted. Psychiatric: Normal mood and affect. Behavior is normal.   ASSESSMENT AND PLAN: 32 year old male residing with altered bowel habits and rectal bleeding for the past few months. His sister has ulcerative colitis. I'm recommending a colonoscopy to  further evaluate, rule out bleeding polyp/mass lesion, rule out IBD, assess for hemorrhoids. I discussed differential with him. I discussed risks and benefits of colonoscopy and anesthesia with him and he wanted to proceed. In the interim I counseled him on a low FODMAP diet to see if this helps some of his bowel changes  and bloating. Other recommendations pending the result of colonoscopy. DRE deferred today in light of need for colonoscopy regardless. He was agreeable with the plan. All questions answered.  Ileene Patrick, MD [facility]  CC: Lucky Cowboy, MD

## 2018-04-21 NOTE — Patient Instructions (Addendum)
If you are age 8965 or older, your body mass index should be between 23-30. Your Body mass index is 34.74 kg/m. If this is out of the aforementioned range listed, please consider follow up with your Primary Care Provider.  If you are age 31 or younger, your body mass index should be between 19-25. Your Body mass index is 34.74 kg/m. If this is out of the aformentioned range listed, please consider follow up with your Primary Care Provider.   You have been scheduled for a colonoscopy. Please follow written instructions given to you at your visit today.  Please pick up your prep supplies at the pharmacy within the next 1-3 days. If you use inhalers (even only as needed), please bring them with you on the day of your procedure. Your physician has requested that you go to www.startemmi.com and enter the access code given to you at your visit today. This web site gives a general overview about your procedure. However, you should still follow specific instructions given to you by our office regarding your preparation for the procedure.  We are giving you a Low FOD-MAP diet to follow.  Thank you for entrusting me with your care and for [facility], Dr. Ileene PatrickSteven Armbruster

## 2018-05-17 ENCOUNTER — Other Ambulatory Visit: Payer: Self-pay | Admitting: Internal Medicine

## 2018-05-17 DIAGNOSIS — F419 Anxiety disorder, unspecified: Secondary | ICD-10-CM

## 2018-05-21 ENCOUNTER — Telehealth: Payer: Self-pay | Admitting: Gastroenterology

## 2018-05-21 NOTE — Telephone Encounter (Signed)
Faxed Suprep "no more than $50" card to [employer]

## 2018-06-03 ENCOUNTER — Other Ambulatory Visit: Payer: Self-pay

## 2018-06-03 ENCOUNTER — Ambulatory Visit (AMBULATORY_SURGERY_CENTER): Payer: Managed Care, Other (non HMO) | Admitting: Gastroenterology

## 2018-06-03 ENCOUNTER — Encounter: Payer: Self-pay | Admitting: Gastroenterology

## 2018-06-03 VITALS — BP 117/74 | HR 52 | Temp 97.7°F | Resp 16 | Ht 77.0 in | Wt 293.0 lb

## 2018-06-03 DIAGNOSIS — K625 Hemorrhage of anus and rectum: Secondary | ICD-10-CM

## 2018-06-03 DIAGNOSIS — K921 Melena: Secondary | ICD-10-CM | POA: Diagnosis present

## 2018-06-03 DIAGNOSIS — R194 Change in bowel habit: Secondary | ICD-10-CM

## 2018-06-03 DIAGNOSIS — K599 Functional intestinal disorder, unspecified: Secondary | ICD-10-CM | POA: Diagnosis not present

## 2018-06-03 MED ORDER — SODIUM CHLORIDE 0.9 % IV SOLN: 500.0000 mL | Freq: Once | INTRAVENOUS | Status: DC

## 2018-06-03 NOTE — Op Note (Signed)
[facility] Patient Name: Spencer Love Procedure Date: 06/03/2018 2:07 PM MRN: [id number] Endoscopist: Viviann Spare P. Adela Lank , MD Age: 32 Referring MD:  Date of Birth: May 14, 1986 Gender: Male Account #: [id number] Procedure:                Colonoscopy Indications:              Hematochezia, Change in bowel habits Medicines:                Monitored Anesthesia Care Procedure:                Pre-Anesthesia Assessment:                           - Prior to the procedure, a History and Physical                            was performed, and patient medications and                            allergies were reviewed. The patient's tolerance of                            previous anesthesia was also reviewed. The risks                            and benefits of the procedure and the sedation                            options and risks were discussed with the patient.                            All questions were answered, and informed consent                            was obtained. Prior Anticoagulants: The patient has                            taken no previous anticoagulant or antiplatelet                            agents. ASA Grade Assessment: III - A patient with                            severe systemic disease. After reviewing the risks                            and benefits, the patient was deemed in                            satisfactory condition to undergo the procedure.                           After obtaining informed consent, the colonoscope  was passed under direct vision. Throughout the                            procedure, the patient's blood pressure, pulse, and                            oxygen saturations were monitored continuously. The                            Colonoscope was introduced through the anus and                            advanced to the the terminal ileum, with                            identification of the  appendiceal orifice and IC                            valve. The colonoscopy was performed without                            difficulty. The patient tolerated the procedure                            well. The quality of the bowel preparation was                            good. The terminal ileum, ileocecal valve,                            appendiceal orifice, and rectum were photographed. Scope In: 2:11:12 PM Scope Out: 2:27:50 PM Scope Withdrawal Time: 0 hours 14 minutes 32 seconds  Total Procedure Duration: 0 hours 16 minutes 38 seconds  Findings:                 The perianal and digital rectal examinations were                            normal.                           The terminal ileum appeared normal.                           Internal hemorrhoids were found during retroflexion.                           The exam was otherwise without abnormality. No                            overt inflammation appreciated.                           Biopsies for histology were taken with a cold  forceps from the right colon, left colon and                            transverse colon for evaluation of microscopic                            colitis. Complications:            No immediate complications. Estimated blood loss:                            Minimal. Estimated Blood Loss:     Estimated blood loss was minimal. Impression:               - The examined portion of the ileum was normal.                           - Internal hemorrhoids.                           - The examination was otherwise normal. No                            inflammatory changes appreciated.                           - Biopsies were taken with a cold forceps from the                            right colon, left colon and transverse colon for                            evaluation of microscopic colitis. Recommendation:           - Patient has a contact number available for                             emergencies. The signs and symptoms of potential                            delayed complications were discussed with the                            patient. Return to normal activities tomorrow.                            Written discharge instructions were provided to the                            patient.                           - Resume previous diet.                           - Continue present medications.                           -  Await pathology results with further                            recommendations pending the results Willaim Rayas. Aliany Fiorenza, MD 06/03/2018 2:31:51 PM This report has been signed electronically.

## 2018-06-03 NOTE — Progress Notes (Signed)
Report given to PACU, vss 

## 2018-06-03 NOTE — Progress Notes (Signed)
Called to room to assist during endoscopic procedure.  Patient ID and intended procedure confirmed with present staff. Received instructions for my participation in the procedure from the performing physician.  

## 2018-06-03 NOTE — Patient Instructions (Signed)
YOU HAD AN ENDOSCOPIC PROCEDURE TODAY AT THE [facility]:   Refer to the procedure report that was given to you for any specific questions about what was found during the examination.  If the procedure report does not answer your questions, please call your gastroenterologist to clarify.  If you requested that your care partner not be given the details of your procedure findings, then the procedure report has been included in a sealed envelope for you to review at your convenience later.  YOU SHOULD EXPECT: Some feelings of bloating in the abdomen. Passage of more gas than usual.  Walking can help get rid of the air that was put into your GI tract during the procedure and reduce the bloating. If you had a lower endoscopy (such as a colonoscopy or flexible sigmoidoscopy) you may notice spotting of blood in your stool or on the toilet paper. If you underwent a bowel prep for your procedure, you may not have a normal bowel movement for a few days.  Please Note:  You might notice some irritation and congestion in your nose or some drainage.  This is from the oxygen used during your procedure.  There is no need for concern and it should clear up in a day or so.  SYMPTOMS TO REPORT IMMEDIATELY:   Following lower endoscopy (colonoscopy or flexible sigmoidoscopy):  Excessive amounts of blood in the stool  Significant tenderness or worsening of abdominal pains  Swelling of the abdomen that is new, acute  Fever of 100F or higher  For urgent or emergent issues, a gastroenterologist can be reached at any hour by calling [phone].   DIET:  We do recommend a small meal at first, but then you may proceed to your regular diet.  Drink plenty of fluids but you should avoid alcoholic beverages for 24 hours.  ACTIVITY:  You should plan to take it easy for the rest of today and you should NOT DRIVE or use heavy machinery until tomorrow (because of the sedation medicines used during the test).     FOLLOW UP: Our staff will call the number listed on your records the next business day following your procedure to check on you and address any questions or concerns that you may have regarding the information given to you following your procedure. If we do not reach you, we will leave a message.  However, if you are feeling well and you are not experiencing any problems, there is no need to return our call.  We will assume that you have returned to your regular daily activities without incident.  If any biopsies were taken you will be contacted by phone or by letter within the next 1-3 weeks.  Please call us at [phone] if you have not heard about the biopsies in 3 weeks.    SIGNATURES/CONFIDENTIALITY: You and/or your care partner have signed paperwork which will be entered into your electronic medical record.  These signatures attest to the fact that that the information above on your After Visit Summary has been reviewed and is understood.  Full responsibility of the confidentiality of this discharge information lies with you and/or your care-partner. 

## 2018-06-04 ENCOUNTER — Telehealth: Payer: Self-pay | Admitting: *Deleted

## 2018-06-04 NOTE — Telephone Encounter (Signed)
  Follow up Call-  Call back number 06/03/2018  Post procedure Call Back phone  # [phone]  Permission to leave phone message Yes  Some recent data might be hidden     Patient questions:  Do you have a fever, pain , or abdominal swelling? No. Pain Score  0 *  Have you tolerated food without any problems? Yes.    Have you been able to return to your normal activities? Yes.    Do you have any questions about your discharge instructions: Diet   No. Medications  No. Follow up visit  No.  Do you have questions or concerns about your Care? No.  Actions: * If pain score is 4 or above: No action needed, pain <4.

## 2018-06-06 ENCOUNTER — Encounter (HOSPITAL_BASED_OUTPATIENT_CLINIC_OR_DEPARTMENT_OTHER): Payer: Self-pay | Admitting: Emergency Medicine

## 2018-06-06 ENCOUNTER — Other Ambulatory Visit: Payer: Self-pay

## 2018-06-06 ENCOUNTER — Emergency Department (HOSPITAL_BASED_OUTPATIENT_CLINIC_OR_DEPARTMENT_OTHER): Payer: Managed Care, Other (non HMO)

## 2018-06-06 ENCOUNTER — Emergency Department (HOSPITAL_BASED_OUTPATIENT_CLINIC_OR_DEPARTMENT_OTHER)
Admission: EM | Admit: 2018-06-06 | Discharge: 2018-06-06 | Disposition: A | Discharge status: Home / Self Care | Payer: Managed Care, Other (non HMO) | Attending: Emergency Medicine | Admitting: Emergency Medicine

## 2018-06-06 DIAGNOSIS — Z7984 Long term (current) use of oral hypoglycemic drugs: Secondary | ICD-10-CM | POA: Diagnosis not present

## 2018-06-06 DIAGNOSIS — Z7982 Long term (current) use of aspirin: Secondary | ICD-10-CM | POA: Diagnosis not present

## 2018-06-06 DIAGNOSIS — I10 Essential (primary) hypertension: Secondary | ICD-10-CM | POA: Insufficient documentation

## 2018-06-06 DIAGNOSIS — R0789 Other chest pain: Secondary | ICD-10-CM | POA: Diagnosis present

## 2018-06-06 DIAGNOSIS — F17210 Nicotine dependence, cigarettes, uncomplicated: Secondary | ICD-10-CM | POA: Diagnosis not present

## 2018-06-06 DIAGNOSIS — Z79899 Other long term (current) drug therapy: Secondary | ICD-10-CM | POA: Insufficient documentation

## 2018-06-06 DIAGNOSIS — M25562 Pain in left knee: Secondary | ICD-10-CM | POA: Insufficient documentation

## 2018-06-06 LAB — CBC
HEMATOCRIT: 37.1 % — AB (ref 39.0–52.0)
Hemoglobin: 13.6 g/dL (ref 13.0–17.0)
MCH: 31.1 pg (ref 26.0–34.0)
MCHC: 36.7 g/dL — AB (ref 30.0–36.0)
MCV: 84.9 fL (ref 78.0–100.0)
PLATELETS: 159 10*3/uL (ref 150–400)
RBC: 4.37 MIL/uL (ref 4.22–5.81)
RDW: 13.2 % (ref 11.5–15.5)
WBC: 5.6 10*3/uL (ref 4.0–10.5)

## 2018-06-06 LAB — BASIC METABOLIC PANEL
Anion gap: 9 (ref 5–15)
BUN: 15 mg/dL (ref 6–20)
CALCIUM: 8.8 mg/dL — AB (ref 8.9–10.3)
CO2: 24 mmol/L (ref 22–32)
Chloride: 107 mmol/L (ref 101–111)
Creatinine, Ser: 0.8 mg/dL (ref 0.61–1.24)
GFR calc Af Amer: >60 mL/min (ref >60)
GLUCOSE: 98 mg/dL (ref 65–99)
Potassium: 3.7 mmol/L (ref 3.5–5.1)
Sodium: 140 mmol/L (ref 135–145)

## 2018-06-06 MED ORDER — NAPROXEN 500 MG PO TABS: 500.0000 mg | ORAL_TABLET | Freq: Two times a day (BID) | ORAL | 0 refills | Status: DC
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
## 2018-06-06 NOTE — Discharge Instructions (Addendum)
Expect to be stiff and sore over the next week.  Take the pain medications as needed.  Return as needed for worsening symptoms.
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
## 2018-06-06 NOTE — ED Notes (Signed)
Patient transported to X-ray 

## 2018-06-14 ENCOUNTER — Encounter: Payer: Self-pay | Admitting: Gastroenterology

## 2018-06-18 ENCOUNTER — Other Ambulatory Visit: Payer: Self-pay | Admitting: Internal Medicine

## 2018-06-18 DIAGNOSIS — F419 Anxiety disorder, unspecified: Secondary | ICD-10-CM

## 2018-06-18 MED ORDER — METFORMIN HCL ER 500 MG PO TB24: ORAL_TABLET | ORAL | 1 refills | Status: DC

## 2018-06-26 ENCOUNTER — Other Ambulatory Visit: Payer: Self-pay | Admitting: Physician Assistant

## 2018-07-29 ENCOUNTER — Ambulatory Visit: Payer: Self-pay | Admitting: Internal Medicine

## 2018-08-04 ENCOUNTER — Other Ambulatory Visit: Payer: Self-pay | Admitting: Internal Medicine

## 2018-08-04 ENCOUNTER — Other Ambulatory Visit: Payer: Self-pay | Admitting: Adult Health

## 2018-08-04 DIAGNOSIS — F419 Anxiety disorder, unspecified: Secondary | ICD-10-CM

## 2018-08-04 MED ORDER — PROPRANOLOL HCL 80 MG PO TABS: 80.0000 mg | ORAL_TABLET | Freq: Three times a day (TID) | ORAL | 1 refills | Status: DC

## 2018-08-04 MED ORDER — HYDROCHLOROTHIAZIDE 25 MG PO TABS: 25.0000 mg | ORAL_TABLET | Freq: Every day | ORAL | 1 refills | Status: DC

## 2018-08-04 MED ORDER — LISINOPRIL 20 MG PO TABS: 20.0000 mg | ORAL_TABLET | Freq: Two times a day (BID) | ORAL | 1 refills | Status: DC

## 2018-08-04 MED ORDER — LORAZEPAM 1 MG PO TABS: ORAL_TABLET | ORAL | 0 refills | Status: DC

## 2018-08-04 MED ORDER — MELOXICAM 15 MG PO TABS
15.0000 mg | ORAL_TABLET | Freq: Every day | ORAL | 1 refills | Status: DC
Start: 2018-08-04 — End: 2019-07-15

## 2018-08-04 MED ORDER — GABAPENTIN 100 MG PO CAPS: ORAL_CAPSULE | ORAL | 0 refills | Status: DC

## 2018-08-04 MED ORDER — CLONIDINE HCL 0.2 MG PO TABS: ORAL_TABLET | ORAL | 1 refills | Status: DC

## 2018-09-06 ENCOUNTER — Other Ambulatory Visit: Payer: Self-pay | Admitting: Internal Medicine

## 2018-09-06 DIAGNOSIS — F419 Anxiety disorder, unspecified: Secondary | ICD-10-CM

## 2018-09-06 DIAGNOSIS — F329 Major depressive disorder, single episode, unspecified: Secondary | ICD-10-CM

## 2018-09-06 DIAGNOSIS — F32A Depression, unspecified: Secondary | ICD-10-CM

## 2018-10-05 ENCOUNTER — Other Ambulatory Visit: Payer: Self-pay | Admitting: Internal Medicine

## 2018-10-05 DIAGNOSIS — F419 Anxiety disorder, unspecified: Secondary | ICD-10-CM

## 2018-10-12 NOTE — Progress Notes (Signed)
Assessment and Plan:  Merel was seen today for ear pain, sinusitis and dysuria.  Diagnoses and all orders for this visit:  Urinary tract infection without hematuria, site unspecified Will culture to verify causative origin, will proceed with doxycycline due to hx of chlamydial infection and engagement in high risk behaviors pending culture/labs Follow up in 2-4 weeks -     CBC with Differential/Platelet       -     COMPLETE METABOLIC PANEL WITH GFR -     Urinalysis w microscopic + reflex cultur -     doxycycline (VIBRAMYCIN) 100 MG capsule; Take 1 capsule twice daily with food  High risk homosexual behavior/history of chlamydia infection Discussed safe sex practices, will screen for STDs today Discussed PrEP today which he is very interested  -     C. trachomatis/N. gonorrhoeae RNA -     RPR -     HIV Antibody (routine testing w rflx) -     Referral to [facility] Infect[facility] program for PrEP  Acute nasopharyngitis Discussed the importance of avoiding unnecessary antibiotic therapy. Suggested symptomatic OTC remedies. Nasal saline spray for congestion. Nasal steroids, allergy pill, oral steroids offered Follow up as needed.  Further disposition pending results of labs. Discussed med's effects and SE's.   Over 15 minutes of exam, counseling, chart review, and critical decision making was performed.   Future Appointments  Date Time Provider Department Center  04/22/2019 10:00 AM Lucky Cowboy, MD [id number] None    ------------------------------------------------------------------------------------------------------------------   HPI BP 130/82   Pulse 89   Temp 98.1 F (36.7 C)   Ht 6\' 5"  (1.956 m)   Wt 283 lb (128.4 kg)   SpO2 97%   BMI 33.56 kg/m   31 y.o.male presents for evaluation of possible URI/UTI; he reports penile/urethral pain with dysuria, urine character is clear x 1 week. He is homosexual and participates in insertive unprotected  anal sex. Urine character is normal for him. No fever/chills, penile discharge, rashes or growths. Denies scrotal, rectal, perineal pain, nausea, vomiting. Denies other LUTS, pain with ejaculation or blood in urine or ejaculate. 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 He endorses 3 new sexual partners in the past 6 months. 2 have known HIV status. He would like STD screening today.   He also reports bilateral ear pressure, sore throat x 3-4 days. He endorses mild cough, runny nose, "like a head cold." He is on zyrtec for allergies. Hasn't tried any other interventions.    Past Medical History:  Diagnosis Date  . Anxiety   . Depression   . Hidradenitis suppurativa   . Hyperlipidemia   . Hypertension   . Prediabetes      Allergies  Allergen Reactions  . Peanut-Containing Drug Products Anaphylaxis  . Ceclor [Cefaclor] Hives and Swelling  . Sulfa Antibiotics Hives    Current Outpatient Medications on File Prior to Visit  Medication Sig  . aspirin EC 81 MG tablet Take 81 mg by mouth 2 (two) times daily.  Marland Kitchen b complex vitamins tablet Take 1 tablet by mouth daily.  Marland Kitchen buPROPion (WELLBUTRIN XL) 300 MG 24 hr tablet TAKE ONE TABLET BY MOUTH IN THE MORNING FOR MOOD AND ADD  . cetirizine (ZYRTEC) 10 MG tablet Take 1 tablet (10 mg total) by mouth daily.  . Cholecalciferol (VITAMIN D3) 5000 units CAPS Take 1 capsule by mouth 2 (two) times daily.  . citalopram (CELEXA) 40 MG tablet take 1 tablet  by mouth daily  . cloNIDine (CATAPRES) 0.1 MG tablet Take 0.1 mg by mouth 3 (three) times daily. Take 1/2 to 1 tablet by mouth three times a day  . fish oil-omega-3 fatty acids 1000 MG capsule Take 1 g by mouth 2 (two) times daily.  . Flaxseed, Linseed, (FLAX SEED OIL PO) Take 1 tablet by mouth 2 (two) times daily.  Marland Kitchen gabapentin (NEURONTIN) 100 MG capsule Take 1 capsule 3 x /day as needed for pain  . lisinopril (PRINIVIL,ZESTRIL) 20 MG tablet Take 1 tablet (20 mg total) by  mouth 2 (two) times daily.  Marland Kitchen LORazepam (ATIVAN) 1 MG tablet TAKE HALF TO ONE TABLET BY  MOUTH TWO TO THREE TIMES A DAY ONLY IF NEEDED FOR ANXIETY ATTACK, LIMIT TO 5 DAYS A WEEK TO AVOID ADDICTION  . Magnesium 400 MG TABS Take 400 mg by mouth daily.  . meloxicam (MOBIC) 15 MG tablet Take 1 tablet (15 mg total) by mouth daily.  . metFORMIN (GLUCOPHAGE-XR) 500 MG 24 hr tablet TAKE ONE TO TWO TABLETS BY MOUTH TWICE DAILY FOR DIABETES  . omeprazole (PRILOSEC) 20 MG capsule Take 2 capsules (40 mg total) by mouth at bedtime.  . propranolol (INDERAL) 80 MG tablet take 1 tablet by mouth 3 times daily  . zinc gluconate 50 MG tablet Take 50 mg by mouth daily.  . naproxen (NAPROSYN) 500 MG tablet Take 1 tablet (500 mg total) by mouth 2 (two) times daily with a meal. As needed for pain   No current facility-administered medications on file prior to visit.     ROS: all negative except above.   Physical Exam:  BP 130/82   Pulse 89   Temp 98.1 F (36.7 C)   Ht 6\' 5"  (1.956 m)   Wt 283 lb (128.4 kg)   SpO2 97%   BMI 33.56 kg/m   General Appearance: Well nourished, in no apparent distress. Eyes: PERRLA, EOMs, conjunctiva no swelling or erythema Sinuses: No Frontal/maxillary tenderness ENT/Mouth: Ext aud canals clear, TMs without erythema, bulging. No erythema, swelling, or exudate on post pharynx.  Tonsils not swollen or erythematous. Hearing normal.  Neck: Supple, thyroid normal.  Respiratory: Respiratory effort normal, BS equal bilaterally without rales, rhonchi, wheezing or stridor.  Cardio: RRR with no MRGs. Brisk peripheral pulses without edema.  Abdomen: Soft, + BS.  Non tender, no guarding, rebound, hernias, masses. Lymphatics: Non tender without lymphadenopathy.  GU: Male genitalia: not done no penile lesions or discharge no testicular masses no bladder distension noted Penis: normal, no lesions and circumcised Urethral Meatus: normal Musculoskeletal: normal gait.  Skin: Warm, dry  without rashes, lesions, ecchymosis.  Psych: Awake and oriented X 3, normal affect, Insight and Judgment appropriate.     Dan Maker, NP 5:54 PM [facility]

## 2018-10-13 ENCOUNTER — Ambulatory Visit: Payer: Managed Care, Other (non HMO) | Admitting: Adult Health

## 2018-10-13 ENCOUNTER — Encounter: Payer: Self-pay | Admitting: Adult Health

## 2018-10-13 VITALS — BP 130/82 | HR 89 | Temp 98.1°F | Ht 77.0 in | Wt 283.0 lb

## 2018-10-13 DIAGNOSIS — N390 Urinary tract infection, site not specified: Secondary | ICD-10-CM | POA: Diagnosis not present

## 2018-10-13 DIAGNOSIS — Z8619 Personal history of other infectious and parasitic diseases: Secondary | ICD-10-CM | POA: Diagnosis not present

## 2018-10-13 DIAGNOSIS — Z7252 High risk homosexual behavior: Secondary | ICD-10-CM

## 2018-10-13 DIAGNOSIS — J00 Acute nasopharyngitis [common cold]: Secondary | ICD-10-CM

## 2018-10-13 DIAGNOSIS — Z79899 Other long term (current) drug therapy: Secondary | ICD-10-CM

## 2018-10-13 MED ORDER — DOXYCYCLINE HYCLATE 100 MG PO CAPS: ORAL_CAPSULE | ORAL | 0 refills | Status: DC

## 2018-10-13 NOTE — Patient Instructions (Signed)
Safe Sex Practicing safe sex means taking steps before and during sex to reduce your risk of:  Getting an STD (sexually transmitted disease).  Giving your partner an STD.  Unwanted pregnancy.  How can I practice safe sex?  To practice safe sex:  Limit your sexual partners to only one partner who is having sex with only you.  Avoid using alcohol and recreational drugs before having sex. These substances can affect your judgment.  Before having sex with a new partner: ? Talk to your partner about past partners, past STDs, and drug use. ? You and your partner should be screened for STDs and discuss the results with each other.  Check your body regularly for sores, blisters, rashes, or unusual discharge. If you notice any of these problems, visit your health care provider.  If you have symptoms of an infection or you are being treated for an STD, avoid sexual contact.  While having sex, use a condom. Make sure to: ? Use a condom every time you have vaginal, oral, or anal sex. Both females and males should wear condoms during oral sex. ? Keep condoms in place from the beginning to the end of sexual activity. ? Use a latex condom, if possible. Latex condoms offer the best protection. ? Use only water-based lubricants or oils to lubricate a condom. Using petroleum-based lubricants or oils will weaken the condom and increase the chance that it will break.  See your health care provider for regular screenings, exams, and tests for STDs.  Talk with your health care provider about the form of birth control (contraception) that is best for you.  Get vaccinated against hepatitis B and human papillomavirus (HPV).  If you are at risk of being infected with HIV (human immunodeficiency virus), talk with your health care provider about taking a prescription medicine to prevent HIV infection. You are considered at risk for HIV if: ? You are a man who has sex with other men. ? You are a  heterosexual man or woman who is sexually active with more than one partner. ? You take drugs by injection. ? You are sexually active with a partner who has HIV.  This information is not intended to replace advice given to you by your health care provider. Make sure you discuss any questions you have with your health care provider. Document Released: 01/22/2005 Document Revised: 04/30/2016 Document Reviewed: 11/04/2015 Elsevier Interactive Patient Education  2018 [employer].   Doxycycline tablets or capsules What is this medicine? DOXYCYCLINE (dox i SYE kleen) is a tetracycline antibiotic. It kills certain bacteria or stops their growth. It is used to treat many kinds of infections, like dental, skin, respiratory, and urinary tract infections. It also treats acne, Lyme disease, malaria, and certain sexually transmitted infections. This medicine may be used for other purposes; ask your health care provider or pharmacist if you have questions. COMMON BRAND NAME(S): Acticlate, Adoxa, Adoxa CK, Adoxa Pak, Adoxa TT, Alodox, Avidoxy, Doxal, Mondoxyne NL, Monodox, Morgidox 1x, Morgidox 1x Kit, Morgidox 2x, Morgidox 2x Kit, NutriDox, Ocudox, TARGADOX, Vibra-Tabs, Vibramycin What should I tell my health care provider before I take this medicine? They need to know if you have any of these conditions: -liver disease -long exposure to sunlight like working outdoors -stomach problems like colitis -an unusual or allergic reaction to doxycycline, tetracycline antibiotics, other medicines, foods, dyes, or preservatives -pregnant or trying to get pregnant -breast-feeding How should I use this medicine? Take this medicine by mouth with a  full glass of water. Follow the directions on the prescription label. It is best to take this medicine without food, but if it upsets your stomach take it with food. Take your medicine at regular intervals. Do not take your medicine more often than directed. Take all of your  medicine as directed even if you think you are better. Do not skip doses or stop your medicine early. Talk to your pediatrician regarding the use of this medicine in children. While this drug may be prescribed for selected conditions, precautions do apply. Overdosage: If you think you have taken too much of this medicine contact a poison control center or emergency room at once. NOTE: This medicine is only for you. Do not share this medicine with others. What if I miss a dose? If you miss a dose, take it as soon as you can. If it is almost time for your next dose, take only that dose. Do not take double or extra doses. What may interact with this medicine? -antacids -barbiturates -birth control pills -bismuth subsalicylate -carbamazepine -methoxyflurane -other antibiotics -phenytoin -vitamins that contain iron -warfarin This list may not describe all possible interactions. Give your health care provider a list of all the medicines, herbs, non-prescription drugs, or dietary supplements you use. Also tell them if you smoke, drink alcohol, or use illegal drugs. Some items may interact with your medicine. What should I watch for while using this medicine? Tell your doctor or health care professional if your symptoms do not improve. Do not treat diarrhea with over the counter products. Contact your doctor if you have diarrhea that lasts more than 2 days or if it is severe and watery. Do not take this medicine just before going to bed. It may not dissolve properly when you lay down and can cause pain in your throat. Drink plenty of fluids while taking this medicine to also help reduce irritation in your throat. This medicine can make you more sensitive to the sun. Keep out of the sun. If you cannot avoid being in the sun, wear protective clothing and use sunscreen. Do not use sun lamps or tanning beds/booths. Birth control pills may not work properly while you are taking this medicine. Talk to your  doctor about using an extra method of birth control. If you are being treated for a sexually transmitted infection, avoid sexual contact until you have finished your treatment. Your sexual partner may also need treatment. Avoid antacids, aluminum, calcium, magnesium, and iron products for 4 hours before and 2 hours after taking a dose of this medicine. If you are using this medicine to prevent malaria, you should still protect yourself from contact with mosquitos. Stay in screened-in areas, use mosquito nets, keep your body covered, and use an insect repellent. What side effects may I notice from receiving this medicine? Side effects that you should report to your doctor or health care professional as soon as possible: -allergic reactions like skin rash, itching or hives, swelling of the face, lips, or tongue -difficulty breathing -fever -itching in the rectal or genital area -pain on swallowing -redness, blistering, peeling or loosening of the skin, including inside the mouth -severe stomach pain or cramps -unusual bleeding or bruising -unusually weak or tired -yellowing of the eyes or skin Side effects that usually do not require medical attention (report to your doctor or health care professional if they continue or are bothersome): -diarrhea -loss of appetite -nausea, vomiting This list may not describe all possible side effects. Call your  doctor for medical advice about side effects. You may report side effects to FDA at 1-800-FDA-1088. Where should I keep my medicine? Keep out of the reach of children. Store at room temperature, below 30 degrees C (86 degrees F). Protect from light. Keep container tightly closed. Throw away any unused medicine after the expiration date. Taking this medicine after the expiration date can make you seriously ill. NOTE: This sheet is a summary. It may not cover all possible information. If you have questions about this medicine, talk to your doctor,  pharmacist, or health care provider.  2018 Elsevier/Gold Standard (2016-01-16 17:11:22)

## 2018-10-14 LAB — CBC WITH DIFFERENTIAL/PLATELET
BASOS PCT: 0.4 %
Basophils Absolute: 29 cells/uL (ref 0–200)
EOS PCT: 3.5 %
Eosinophils Absolute: 256 cells/uL (ref 15–500)
HEMATOCRIT: 41.6 % (ref 38.5–50.0)
Hemoglobin: 14.7 g/dL (ref 13.2–17.1)
Lymphs Abs: 1591 cells/uL (ref 850–3900)
MCH: 30.2 pg (ref 27.0–33.0)
MCHC: 35.3 g/dL (ref 32.0–36.0)
MCV: 85.4 fL (ref 80.0–100.0)
MPV: 11.8 fL (ref 7.5–12.5)
Monocytes Relative: 6.8 %
NEUTROS ABS: 4928 {cells}/uL (ref 1500–7800)
Neutrophils Relative %: 67.5 %
PLATELETS: 161 10*3/uL (ref 140–400)
RBC: 4.87 10*6/uL (ref 4.20–5.80)
RDW: 13.1 % (ref 11.0–15.0)
Total Lymphocyte: 21.8 %
WBC mixed population: 496 cells/uL (ref 200–950)
WBC: 7.3 10*3/uL (ref 3.8–10.8)

## 2018-10-14 LAB — C. TRACHOMATIS/N. GONORRHOEAE RNA
C. trachomatis RNA, TMA: NOT DETECTED
N. gonorrhoeae RNA, TMA: NOT DETECTED

## 2018-10-14 LAB — COMPLETE METABOLIC PANEL WITH GFR
AG Ratio: 2.3 (calc) (ref 1.0–2.5)
ALT: 18 U/L (ref 9–46)
AST: 15 U/L (ref 10–40)
Albumin: 4.5 g/dL (ref 3.6–5.1)
Alkaline phosphatase (APISO): 55 U/L (ref 40–115)
BUN: 10 mg/dL (ref 7–25)
CALCIUM: 9.2 mg/dL (ref 8.6–10.3)
CO2: 29 mmol/L (ref 20–32)
Chloride: 100 mmol/L (ref 98–110)
Creat: 0.93 mg/dL (ref 0.60–1.35)
GFR, EST AFRICAN AMERICAN: 127 mL/min/{1.73_m2} (ref >=60)
GFR, EST NON AFRICAN AMERICAN: 110 mL/min/{1.73_m2} (ref >=60)
GLOBULIN: 2 g/dL (ref 1.9–3.7)
Glucose, Bld: 81 mg/dL (ref 65–99)
POTASSIUM: 4.2 mmol/L (ref 3.5–5.3)
SODIUM: 138 mmol/L (ref 135–146)
TOTAL PROTEIN: 6.5 g/dL (ref 6.1–8.1)
Total Bilirubin: 0.7 mg/dL (ref 0.2–1.2)

## 2018-10-14 LAB — URINALYSIS W MICROSCOPIC + REFLEX CULTURE
BILIRUBIN URINE: NEGATIVE
Bacteria, UA: NONE SEEN /HPF
GLUCOSE, UA: NEGATIVE
Hgb urine dipstick: NEGATIVE
Hyaline Cast: NONE SEEN /LPF
Ketones, ur: NEGATIVE
Leukocyte Esterase: NEGATIVE
NITRITES URINE, INITIAL: NEGATIVE
PH: 7 (ref 5.0–8.0)
PROTEIN: NEGATIVE
RBC / HPF: NONE SEEN /HPF (ref 0–2)
Specific Gravity, Urine: 1.005 (ref 1.001–1.03)
Squamous Epithelial / LPF: NONE SEEN /HPF (ref <=5)
WBC UA: NONE SEEN /HPF (ref 0–5)

## 2018-10-14 LAB — HIV ANTIBODY (ROUTINE TESTING W REFLEX): HIV 1&2 Ab, 4th Generation: NONREACTIVE

## 2018-10-14 LAB — RPR: RPR: NONREACTIVE

## 2018-10-14 LAB — NO CULTURE INDICATED

## 2018-10-15 ENCOUNTER — Other Ambulatory Visit: Payer: Self-pay | Admitting: Adult Health

## 2018-10-15 MED ORDER — CIPROFLOXACIN HCL 500 MG PO TABS: 500.0000 mg | ORAL_TABLET | Freq: Two times a day (BID) | ORAL | 0 refills | Status: DC

## 2018-10-20 ENCOUNTER — Other Ambulatory Visit (HOSPITAL_COMMUNITY)
Admission: RE | Admit: 2018-10-20 | Discharge: 2018-10-20 | Disposition: A | Discharge status: Home / Self Care | Payer: Managed Care, Other (non HMO) | Source: Ambulatory Visit | Attending: Infectious Disease | Admitting: Infectious Disease

## 2018-10-20 ENCOUNTER — Ambulatory Visit (INDEPENDENT_AMBULATORY_CARE_PROVIDER_SITE_OTHER): Payer: Managed Care, Other (non HMO) | Admitting: Pharmacist

## 2018-10-20 DIAGNOSIS — Z7252 High risk homosexual behavior: Secondary | ICD-10-CM | POA: Diagnosis not present

## 2018-10-20 NOTE — Patient Instructions (Addendum)
It was great to see you today!   Start Descovy daily for PrEP.  We will call you with lab results later this week.

## 2018-10-20 NOTE — Progress Notes (Signed)
Date:  10/20/2018   HPI: Spencer Love is a 32 y.o. male presents to [facility] for PrEP.  Insured   [x]    Uninsured  []    Patient Active Problem List   Diagnosis Date Noted  . Testosterone deficiency 04/17/2016  . BMI 33.49,  adult 10/11/2015  . GERD 03/11/2015  . Genital warts 12/05/2014  . Morbid obesity (HCC) 11/01/2014  . Mixed hyperlipidemia 09/28/2014  . Medication management 09/28/2014  . Vitamin D deficiency 09/28/2014  . OSA (obstructive sleep apnea) 06/21/2014  . Depression, controlled   . Prediabetes   . Essential hypertension 06/09/2013    Patient's Medications  New Prescriptions   No medications on file  Previous Medications   ASPIRIN EC 81 MG TABLET    Take 81 mg by mouth 2 (two) times daily.   B COMPLEX VITAMINS TABLET    Take 1 tablet by mouth daily.   BUPROPION (WELLBUTRIN XL) 300 MG 24 HR TABLET    TAKE ONE TABLET BY MOUTH IN THE MORNING FOR MOOD AND ADD   CETIRIZINE (ZYRTEC) 10 MG TABLET    Take 1 tablet (10 mg total) by mouth daily.   CHOLECALCIFEROL (VITAMIN D3) 5000 UNITS CAPS    Take 1 capsule by mouth 2 (two) times daily.   CIPROFLOXACIN (CIPRO) 500 MG TABLET    Take 1 tablet (500 mg total) by mouth 2 (two) times daily.   CITALOPRAM (CELEXA) 40 MG TABLET    take 1 tablet by mouth daily   CLONIDINE (CATAPRES) 0.1 MG TABLET    Take 0.1 mg by mouth 3 (three) times daily. Take 1/2 to 1 tablet by mouth three times a day   DOXYCYCLINE (VIBRAMYCIN) 100 MG CAPSULE    Take 1 capsule twice daily with food   FISH OIL-OMEGA-3 FATTY ACIDS 1000 MG CAPSULE    Take 1 g by mouth 2 (two) times daily.   FLAXSEED, LINSEED, (FLAX SEED OIL PO)    Take 1 tablet by mouth 2 (two) times daily.   GABAPENTIN (NEURONTIN) 100 MG CAPSULE    Take 1 capsule 3 x /day as needed for pain   LISINOPRIL (PRINIVIL,ZESTRIL) 20 MG TABLET    Take 1 tablet (20 mg total) by mouth 2 (two) times daily.   LORAZEPAM (ATIVAN) 1 MG TABLET    TAKE HALF TO ONE TABLET BY  MOUTH TWO TO THREE TIMES A DAY  ONLY IF NEEDED FOR ANXIETY ATTACK, LIMIT TO 5 DAYS A WEEK TO AVOID ADDICTION   MAGNESIUM 400 MG TABS    Take 400 mg by mouth daily.   MELOXICAM (MOBIC) 15 MG TABLET    Take 1 tablet (15 mg total) by mouth daily.   METFORMIN (GLUCOPHAGE-XR) 500 MG 24 HR TABLET    TAKE ONE TO TWO TABLETS BY MOUTH TWICE DAILY FOR DIABETES   NAPROXEN (NAPROSYN) 500 MG TABLET    Take 1 tablet (500 mg total) by mouth 2 (two) times daily with a meal. As needed for pain   OMEPRAZOLE (PRILOSEC) 20 MG CAPSULE    Take 2 capsules (40 mg total) by mouth at bedtime.   PROPRANOLOL (INDERAL) 80 MG TABLET    take 1 tablet by mouth 3 times daily   ZINC GLUCONATE 50 MG TABLET    Take 50 mg by mouth daily.  Modified Medications   No medications on file  Discontinued Medications   No medications on file    Allergies: Allergies  Allergen Reactions  . Peanut-Containing Drug Products Anaphylaxis  .  Ceclor [Cefaclor] Hives and Swelling  . Sulfa Antibiotics Hives    Past Medical History: Past Medical History:  Diagnosis Date  . Anxiety   . Depression   . Hidradenitis suppurativa   . Hyperlipidemia   . Hypertension   . Prediabetes     Social History: Social History   Socioeconomic History  . Marital status: Single    Spouse name: Not on file  . Number of children: 0  . Years of education: Not on file  . Highest education level: Not on file  Occupational History  . Occupation: Research scientist (life sciences): [employer]  Social Needs  . Financial resource strain: Not on file  . Food insecurity:    Worry: Not on file    Inability: Not on file  . Transportation needs:    Medical: Not on file    Non-medical: Not on file  Tobacco Use  . Smoking status: Current Every Day Smoker    Packs/day: 0.60    Years: 10.00    Pack years: 6.00    Types: Cigarettes  . Smokeless tobacco: Never Used  Substance and Sexual Activity  . Alcohol use: Yes    Comment: social  . Drug use: Yes    Types: Marijuana  . Sexual  activity: Not on file  Lifestyle  . Physical activity:    Days per week: Not on file    Minutes per session: Not on file  . Stress: Not on file  Relationships  . Social connections:    Talks on phone: Not on file    Gets together: Not on file    Attends religious service: Not on file    Active member of club or organization: Not on file    Attends meetings of clubs or organizations: Not on file    Relationship status: Not on file  Other Topics Concern  . Not on file  Social History Narrative   Lives alone.      [facility] HIV PREP FLOWSHEET RESULTS 10/20/2018  Insurance Status Insured  How did you hear? primary  Gender at birth Male  Gender identity cis-Male  Risk for HIV Condomless vaginal or anal intercourse  Sex Partners Men only  # sex partners past 3-6 mos 4-6  Sex activity preferences Insertive;Oral  Condom use No  Partners genders and ages 90 25-29;M 20-24;M 15-19  Treated for STI? Yes  HIV symptoms? Sore throat  PrEP Eligibility HIV negative;CrCl >60 ml/min;Substantial risk for HIV    Labs:  SCr: Lab Results  Component Value Date   CREATININE 0.93 10/13/2018   CREATININE 0.80 06/06/2018   CREATININE 0.89 11/12/2017   CREATININE 1.01 07/14/2017   CREATININE 0.91 03/09/2017   HIV Lab Results  Component Value Date   HIV NON-REACTIVE 10/13/2018   HIV NONREACTIVE 04/15/2017   HIV NONREACTIVE 03/09/2017   HIV NON REACTIVE 02/03/2014   Hepatitis B No results found for: HEPBSAB, HEPBSAG, HEPBCAB Hepatitis C No results found for: HEPCAB, HCVRNAPCRQN Hepatitis A No results found for: HAV RPR and STI Lab Results  Component Value Date   LABRPR NON-REACTIVE 10/13/2018   LABRPR NON REAC 04/15/2017   LABRPR NON REAC 03/09/2017   LABRPR NON REAC 02/03/2014    STI Results GC CT  03/09/2017 CANCELED CANCELED  03/09/2017 CANCELED -    Assessment: Sabian wishes to start Descovy for PrEP. Will test for hep B today. Counseled patient on how Descovy is a one pill  once daily regimen with or without food  that can prevent HIV. Discussed the importance of taking the medication daily to provide protection and decreased adherence is associated with decreased efficacy. Counseled patient that Descovy is normally well tolerated, however some patients experience nausea, diarrhea, and fatigue that resolve in a few weeks. Also discussed how Descovy works to prevent HIV but not other STDs and encouraged the use of condoms. Patient is concerned for drug interactions; medications were reviewed there are no current interactions. Encouraged patient to call clinic if he starts new medications. Discussed how our PrEP process works here at the clinic including follow ups and lab monitoring.   Patient complains of pain in urethra. He has been evaluated by PCP and prescribed antibiotics (ciprofloxacin and doxycycline). Patient offered repeat urine STD testing but decline at this time since he is still on therapy. Encouraged to follow with primary and call clinic if pain does not resolve. He inquired about PrEP for his partner. Given [name]'s contact information and encouraged to refer his friends, insured and uninsured, to be evaluated for PrEP. Follow up with [name] on 11/20 for one-month PrEP follow up.  Plan: - Start Descovy (negative HIV 10/13/18) - Hepatitis studies, HIV antibody, oral STD screen today - Follow up with [name] on 11/20 for one month PrEP  Amanda Pea, Pharm D PGY1 Pharmacy Resident  10/20/2018      3:02 PM

## 2018-10-21 LAB — HIV ANTIBODY (ROUTINE TESTING W REFLEX): HIV 1&2 Ab, 4th Generation: NONREACTIVE

## 2018-10-21 LAB — HEPATITIS C ANTIBODY
HEP C AB: NONREACTIVE
SIGNAL TO CUT-OFF: 0.01 (ref <1.00)

## 2018-10-21 LAB — HEPATITIS B SURFACE ANTIGEN: Hepatitis B Surface Ag: NONREACTIVE

## 2018-10-21 LAB — CYTOLOGY, (ORAL, ANAL, URETHRAL) ANCILLARY ONLY
Chlamydia: NEGATIVE
Neisseria Gonorrhea: NEGATIVE

## 2018-10-21 LAB — HEPATITIS A ANTIBODY, TOTAL: Hepatitis A AB,Total: NONREACTIVE

## 2018-10-21 LAB — HEPATITIS B SURFACE ANTIBODY,QUALITATIVE: Hep B S Ab: BORDERLINE — AB

## 2018-10-22 ENCOUNTER — Telehealth: Payer: Self-pay | Admitting: Pharmacist

## 2018-10-22 DIAGNOSIS — Z7252 High risk homosexual behavior: Secondary | ICD-10-CM

## 2018-10-22 NOTE — Telephone Encounter (Signed)
Called patient to let him know that his HIV antibody was negative.  Patient's insurance will pay for Truvada but not Descovy. Wrote an appeal letter to his insurance to describe what the Descovy is for and that it is now approved for PrEP.  Discussed this with patient and he will wait. Told him to be careful until he can get on PrEP. Will let him know once I hear back from his insurance.

## 2018-10-26 MED ORDER — EMTRICITABINE-TENOFOVIR AF 200-25 MG PO TABS: 1.0000 | ORAL_TABLET | Freq: Every day | ORAL | 0 refills | Status: DC

## 2018-10-26 MED FILL — DESCOVY 200-25 MG TABS: 200-25 | 30 days supply | Qty: 30 | Fill #0

## 2018-10-26 NOTE — Telephone Encounter (Signed)
Patient's insurance approved Descovy.  He had a $100 co-pay but Kathie Rhodes was able to get him a co-pay card. Discussed with patient today and he will go pick up at [facility].

## 2018-10-26 NOTE — Addendum Note (Signed)
Addended by: Aggie Cosier L on: 10/26/2018 10:15 AM   Modules accepted: Orders

## 2018-11-05 ENCOUNTER — Other Ambulatory Visit: Payer: Self-pay | Admitting: Internal Medicine

## 2018-11-05 DIAGNOSIS — F419 Anxiety disorder, unspecified: Secondary | ICD-10-CM

## 2018-11-17 ENCOUNTER — Ambulatory Visit (INDEPENDENT_AMBULATORY_CARE_PROVIDER_SITE_OTHER): Payer: 59 | Admitting: Pharmacist

## 2018-11-17 ENCOUNTER — Other Ambulatory Visit (HOSPITAL_COMMUNITY)
Admission: RE | Admit: 2018-11-17 | Discharge: 2018-11-17 | Disposition: A | Discharge status: Home / Self Care | Payer: 59 | Source: Ambulatory Visit | Attending: Infectious Disease | Admitting: Infectious Disease

## 2018-11-17 DIAGNOSIS — Z7252 High risk homosexual behavior: Secondary | ICD-10-CM | POA: Insufficient documentation

## 2018-11-17 NOTE — Progress Notes (Signed)
Date:  11/17/2018   HPI: Spencer Love is a 32 y.o. male presenting for 1 month PrEP follow up.  Insured   [x]    Uninsured  []    Patient Active Problem List   Diagnosis Date Noted  . Testosterone deficiency 04/17/2016  . BMI 33.49,  adult 10/11/2015  . GERD 03/11/2015  . Genital warts 12/05/2014  . Morbid obesity (HCC) 11/01/2014  . Mixed hyperlipidemia 09/28/2014  . Medication management 09/28/2014  . Vitamin D deficiency 09/28/2014  . OSA (obstructive sleep apnea) 06/21/2014  . Depression, controlled   . Prediabetes   . Essential hypertension 06/09/2013    Patient's Medications  New Prescriptions   No medications on file  Previous Medications   ASPIRIN EC 81 MG TABLET    Take 81 mg by mouth 2 (two) times daily.   B COMPLEX VITAMINS TABLET    Take 1 tablet by mouth daily.   BUPROPION (WELLBUTRIN XL) 300 MG 24 HR TABLET    TAKE ONE TABLET BY MOUTH IN THE MORNING FOR MOOD AND ADD   CETIRIZINE (ZYRTEC) 10 MG TABLET    Take 1 tablet (10 mg total) by mouth daily.   CHOLECALCIFEROL (VITAMIN D3) 5000 UNITS CAPS    Take 1 capsule by mouth 2 (two) times daily.   CIPROFLOXACIN (CIPRO) 500 MG TABLET    Take 1 tablet (500 mg total) by mouth 2 (two) times daily.   CITALOPRAM (CELEXA) 40 MG TABLET    take 1 tablet by mouth daily   CLONIDINE (CATAPRES) 0.1 MG TABLET    Take 0.1 mg by mouth 3 (three) times daily. Take 1/2 to 1 tablet by mouth three times a day   DOXYCYCLINE (VIBRAMYCIN) 100 MG CAPSULE    Take 1 capsule twice daily with food   EMTRICITABINE-TENOFOVIR AF (DESCOVY) 200-25 MG TABLET    Take 1 tablet by mouth daily.   FISH OIL-OMEGA-3 FATTY ACIDS 1000 MG CAPSULE    Take 1 g by mouth 2 (two) times daily.   FLAXSEED, LINSEED, (FLAX SEED OIL PO)    Take 1 tablet by mouth 2 (two) times daily.   GABAPENTIN (NEURONTIN) 100 MG CAPSULE    Take 1 capsule 3 x /day as needed for pain   LISINOPRIL (PRINIVIL,ZESTRIL) 20 MG TABLET    Take 1 tablet (20 mg total) by mouth 2 (two) times  daily.   LORAZEPAM (ATIVAN) 1 MG TABLET    TAKE HALF TO ONE TABLET BY MOUTH TWO TO THREE TIMES A DAY ONLY IF NEEDED FOR ANXIETY ATTACK. LIMIT TO 5 DAYS A WEEK TO AVOID ADDICTION   MAGNESIUM 400 MG TABS    Take 400 mg by mouth daily.   MELOXICAM (MOBIC) 15 MG TABLET    Take 1 tablet (15 mg total) by mouth daily.   METFORMIN (GLUCOPHAGE-XR) 500 MG 24 HR TABLET    TAKE ONE TO TWO TABLETS BY MOUTH TWICE DAILY FOR DIABETES   NAPROXEN (NAPROSYN) 500 MG TABLET    Take 1 tablet (500 mg total) by mouth 2 (two) times daily with a meal. As needed for pain   OMEPRAZOLE (PRILOSEC) 20 MG CAPSULE    Take 2 capsules (40 mg total) by mouth at bedtime.   PROPRANOLOL (INDERAL) 80 MG TABLET    take 1 tablet by mouth 3 times daily   ZINC GLUCONATE 50 MG TABLET    Take 50 mg by mouth daily.  Modified Medications   No medications on file  Discontinued Medications   No  medications on file    Allergies: Allergies  Allergen Reactions  . Peanut-Containing Drug Products Anaphylaxis  . Ceclor [Cefaclor] Hives and Swelling  . Sulfa Antibiotics Hives    Past Medical History: Past Medical History:  Diagnosis Date  . Anxiety   . Depression   . Hidradenitis suppurativa   . Hyperlipidemia   . Hypertension   . Prediabetes     Social History: Social History   Socioeconomic History  . Marital status: Single    Spouse name: Not on file  . Number of children: 0  . Years of education: Not on file  . Highest education level: Not on file  Occupational History  . Occupation: Research scientist (life sciences): [employer]  Social Needs  . Financial resource strain: Not on file  . Food insecurity:    Worry: Not on file    Inability: Not on file  . Transportation needs:    Medical: Not on file    Non-medical: Not on file  Tobacco Use  . Smoking status: Current Every Day Smoker    Packs/day: 0.60    Years: 10.00    Pack years: 6.00    Types: Cigarettes  . Smokeless tobacco: Never Used  Substance and Sexual  Activity  . Alcohol use: Yes    Comment: social  . Drug use: Yes    Types: Marijuana  . Sexual activity: Not on file  Lifestyle  . Physical activity:    Days per week: Not on file    Minutes per session: Not on file  . Stress: Not on file  Relationships  . Social connections:    Talks on phone: Not on file    Gets together: Not on file    Attends religious service: Not on file    Active member of club or organization: Not on file    Attends meetings of clubs or organizations: Not on file    Relationship status: Not on file  Other Topics Concern  . Not on file  Social History Narrative   Lives alone.      [facility] HIV PREP FLOWSHEET RESULTS 11/17/2018 10/20/2018  Insurance Status Insured Insured  How did you hear? - primary  Gender at birth Male Male  Gender identity cis-Male cis-Male  Risk for HIV Condomless vaginal or anal intercourse;>5 partners in past 6 mos (regardless of condom use) Condomless vaginal or anal intercourse  Sex Partners Men only Men only  # sex partners past 3-6 mos 7-9 4-6  Sex activity preferences Insertive;Oral Insertive;Oral  Condom use Yes No  % condom use 10 -  Partners genders and ages - M 25-29;M 20-24;M 15-19  Treated for STI? No Yes  HIV symptoms? Sore throat Sore throat  PrEP Eligibility HIV negative;CrCl >60 ml/min;Substantial risk for HIV HIV negative;CrCl >60 ml/min;Substantial risk for HIV    Labs:  SCr: Lab Results  Component Value Date   CREATININE 0.93 10/13/2018   CREATININE 0.80 06/06/2018   CREATININE 0.89 11/12/2017   CREATININE 1.01 07/14/2017   CREATININE 0.91 03/09/2017   HIV Lab Results  Component Value Date   HIV NON-REACTIVE 10/20/2018   HIV NON-REACTIVE 10/13/2018   HIV NONREACTIVE 04/15/2017   HIV NONREACTIVE 03/09/2017   HIV NON REACTIVE 02/03/2014   Hepatitis B Lab Results  Component Value Date   HEPBSAB BORDERLINE (A) 10/20/2018   HEPBSAG NON-REACTIVE 10/20/2018   Hepatitis C Lab Results  Component  Value Date   HEPCAB NON-REACTIVE 10/20/2018   Hepatitis A Lab  Results  Component Value Date   HAV NON-REACTIVE 10/20/2018   RPR and STI Lab Results  Component Value Date   LABRPR NON-REACTIVE 10/13/2018   LABRPR NON REAC 04/15/2017   LABRPR NON REAC 03/09/2017   LABRPR NON REAC 02/03/2014    STI Results GC GC CT CT  10/20/2018 Negative - Negative -  03/09/2017 - CANCELED - CANCELED  03/09/2017 - CANCELED - -    Assessment: Spencer Love is here for 1 month PrEP follow up. He has noticed no side effects with Descovy and had no issues picking up the medication from the pharmacy. He has missed no doses. He estimates he has had 8-9 partners since his last visit and used condoms with only 1. He is the insertive partner with oral involvement as well. He was once the receptive partner, but does not anticipate this becoming a common occurrence. He was previously prescribed cipro and doxy for urethral pain, presumed due to an STI. However, all tests were negative. He has finished the antibiotics and still has occasional pain, therefore thinks it may be due to rough sexual activity rather than infection. Due to the high number of partners, we will re-test for STIs today. He did have a sore throat recently and was prescribed azithromycin, which helped.   Trinna Postlex is not immune to hepatitis A and has borderline immunity to hepatitis B. We will check a quantitative hepatitis B antibody today and pending results, will start the hepatitis A series and hepatitis B booster or repeat series, if needed, at his next follow up visit. [facility] as he thinks that his insurance will prefer this.  Plan: HIV Ab If negative, 3 months Descovy BMET Oral and urine cytology Quantitative hepatitis B antibody Next f/u appointment on 2/19 at 1130  Erin N. Zigmund Danieleja, PharmD PGY2 Infectious Diseases Pharmacy Resident Phone: [phone] 11/17/2018, 12:10 PM

## 2018-11-18 ENCOUNTER — Encounter: Payer: Self-pay | Admitting: Pharmacist

## 2018-11-18 ENCOUNTER — Telehealth: Payer: Self-pay | Admitting: Pharmacist

## 2018-11-18 DIAGNOSIS — Z7252 High risk homosexual behavior: Secondary | ICD-10-CM

## 2018-11-18 LAB — CYTOLOGY, (ORAL, ANAL, URETHRAL) ANCILLARY ONLY
CHLAMYDIA, DNA PROBE: NEGATIVE
Neisseria Gonorrhea: NEGATIVE

## 2018-11-18 LAB — BASIC METABOLIC PANEL
BUN: 15 mg/dL (ref 7–25)
CO2: 27 mmol/L (ref 20–32)
CREATININE: 1 mg/dL (ref 0.60–1.35)
Calcium: 9.4 mg/dL (ref 8.6–10.3)
Chloride: 100 mmol/L (ref 98–110)
GLUCOSE: 81 mg/dL (ref 65–99)
Potassium: 4.1 mmol/L (ref 3.5–5.3)
Sodium: 140 mmol/L (ref 135–146)

## 2018-11-18 LAB — URINE CYTOLOGY ANCILLARY ONLY
CHLAMYDIA, DNA PROBE: NEGATIVE
NEISSERIA GONORRHEA: NEGATIVE

## 2018-11-18 LAB — HIV ANTIBODY (ROUTINE TESTING W REFLEX): HIV 1&2 Ab, 4th Generation: NONREACTIVE

## 2018-11-18 LAB — HEPATITIS B SURFACE ANTIBODY, QUANTITATIVE: Hepatitis B-Post: 8 m[IU]/mL — ABNORMAL LOW (ref >=10)

## 2018-11-18 MED ORDER — EMTRICITABINE-TENOFOVIR AF 200-25 MG PO TABS: 1.0000 | ORAL_TABLET | Freq: Every day | ORAL | 2 refills | Status: DC

## 2018-11-18 MED FILL — DESCOVY 200-25 MG TABS: 200-25 | 30 days supply | Qty: 30 | Fill #0

## 2018-11-18 NOTE — Telephone Encounter (Signed)
Called patient to let him know that his HIV antibody was negative.  Will send in 3 more months of Descovy.  

## 2018-12-11 ENCOUNTER — Other Ambulatory Visit: Payer: Self-pay | Admitting: Internal Medicine

## 2018-12-11 DIAGNOSIS — F419 Anxiety disorder, unspecified: Secondary | ICD-10-CM

## 2018-12-11 MED ORDER — LORAZEPAM 1 MG PO TABS: ORAL_TABLET | ORAL | 0 refills | Status: DC

## 2018-12-16 MED FILL — DESCOVY 200-25 MG TABS: 200-25 | 30 days supply | Qty: 30 | Fill #1

## 2019-01-12 MED FILL — DESCOVY 200-25 MG TABS: 200-25 | 30 days supply | Qty: 30 | Fill #2

## 2019-01-15 ENCOUNTER — Other Ambulatory Visit: Payer: Self-pay | Admitting: Internal Medicine

## 2019-01-15 DIAGNOSIS — F419 Anxiety disorder, unspecified: Secondary | ICD-10-CM

## 2019-02-16 ENCOUNTER — Ambulatory Visit (INDEPENDENT_AMBULATORY_CARE_PROVIDER_SITE_OTHER): Payer: 59 | Admitting: Pharmacist

## 2019-02-16 DIAGNOSIS — Z7252 High risk homosexual behavior: Secondary | ICD-10-CM | POA: Diagnosis not present

## 2019-02-16 DIAGNOSIS — Z23 Encounter for immunization: Secondary | ICD-10-CM | POA: Diagnosis not present

## 2019-02-16 NOTE — Progress Notes (Signed)
Date:  02/16/2019   HPI: Spencer Love is a 33 y.o. male who presents to the [facility] pharmacy clinic for 3 month PrEP follow-up.  Insured   [x]    Uninsured  []    Patient Active Problem List   Diagnosis Date Noted  . Testosterone deficiency 04/17/2016  . BMI 33.49,  adult 10/11/2015  . GERD 03/11/2015  . Genital warts 12/05/2014  . Morbid obesity (HCC) 11/01/2014  . Mixed hyperlipidemia 09/28/2014  . Medication management 09/28/2014  . Vitamin D deficiency 09/28/2014  . OSA (obstructive sleep apnea) 06/21/2014  . Depression, controlled   . Prediabetes   . Essential hypertension 06/09/2013    Patient's Medications  New Prescriptions   No medications on file  Previous Medications   ASPIRIN EC 81 MG TABLET    Take 81 mg by mouth 2 (two) times daily.   B COMPLEX VITAMINS TABLET    Take 1 tablet by mouth daily.   BUPROPION (WELLBUTRIN XL) 300 MG 24 HR TABLET    TAKE ONE TABLET BY MOUTH IN THE MORNING FOR MOOD AND ADD   CETIRIZINE (ZYRTEC) 10 MG TABLET    Take 1 tablet (10 mg total) by mouth daily.   CHOLECALCIFEROL (VITAMIN D3) 5000 UNITS CAPS    Take 1 capsule by mouth 2 (two) times daily.   CIPROFLOXACIN (CIPRO) 500 MG TABLET    Take 1 tablet (500 mg total) by mouth 2 (two) times daily.   CITALOPRAM (CELEXA) 40 MG TABLET    take 1 tablet by mouth daily   CLONIDINE (CATAPRES) 0.1 MG TABLET    Take 0.1 mg by mouth 3 (three) times daily. Take 1/2 to 1 tablet by mouth three times a day   EMTRICITABINE-TENOFOVIR AF (DESCOVY) 200-25 MG TABLET    Take 1 tablet by mouth daily.   FISH OIL-OMEGA-3 FATTY ACIDS 1000 MG CAPSULE    Take 1 g by mouth 2 (two) times daily.   FLAXSEED, LINSEED, (FLAX SEED OIL PO)    Take 1 tablet by mouth 2 (two) times daily.   GABAPENTIN (NEURONTIN) 100 MG CAPSULE    Take 1 capsule 3 x /day as needed for pain   LISINOPRIL (PRINIVIL,ZESTRIL) 20 MG TABLET    Take 1 tablet (20 mg total) by mouth 2 (two) times daily.   LORAZEPAM (ATIVAN) 1 MG TABLET    Take 1/2-1  tablet 2 - 3 x /day ONLY if needed for Anxiety Attack &  limit to 5 days /week to avoid addiction   MAGNESIUM 400 MG TABS    Take 400 mg by mouth daily.   MELOXICAM (MOBIC) 15 MG TABLET    Take 1 tablet (15 mg total) by mouth daily.   METFORMIN (GLUCOPHAGE-XR) 500 MG 24 HR TABLET    TAKE ONE TO TWO TABLETS BY MOUTH TWICE DAILY FOR DIABETES   OMEPRAZOLE (PRILOSEC) 20 MG CAPSULE    Take 2 capsules (40 mg total) by mouth at bedtime.   PROPRANOLOL (INDERAL) 80 MG TABLET    take 1 tablet by mouth 3 times daily   ZINC GLUCONATE 50 MG TABLET    Take 50 mg by mouth daily.  Modified Medications   No medications on file  Discontinued Medications   No medications on file    Allergies: Allergies  Allergen Reactions  . Peanut-Containing Drug Products Anaphylaxis  . Ceclor [Cefaclor] Hives and Swelling  . Sulfa Antibiotics Hives    Past Medical History: Past Medical History:  Diagnosis Date  . Anxiety   .  Depression   . Hidradenitis suppurativa   . Hyperlipidemia   . Hypertension   . Prediabetes     Social History: Social History   Socioeconomic History  . Marital status: Single    Spouse name: Not on file  . Number of children: 0  . Years of education: Not on file  . Highest education level: Not on file  Occupational History  . Occupation: Research scientist (life sciences)meat cutter    Employer: [employer]  Social Needs  . Financial resource strain: Not on file  . Food insecurity:    Worry: Not on file    Inability: Not on file  . Transportation needs:    Medical: Not on file    Non-medical: Not on file  Tobacco Use  . Smoking status: Current Every Day Smoker    Packs/day: 0.60    Years: 10.00    Pack years: 6.00    Types: Cigarettes  . Smokeless tobacco: Never Used  Substance and Sexual Activity  . Alcohol use: Yes    Comment: social  . Drug use: Yes    Types: Marijuana  . Sexual activity: Not on file  Lifestyle  . Physical activity:    Days per week: Not on file    Minutes per session: Not  on file  . Stress: Not on file  Relationships  . Social connections:    Talks on phone: Not on file    Gets together: Not on file    Attends religious service: Not on file    Active member of club or organization: Not on file    Attends meetings of clubs or organizations: Not on file    Relationship status: Not on file  Other Topics Concern  . Not on file  Social History Narrative   Lives alone.      [facility] HIV PREP FLOWSHEET RESULTS 02/16/2019 11/17/2018 10/20/2018  Insurance Status Insured Insured Insured  How did you hear? - - primary  Gender at birth Male Male Male  Gender identity cis-Male cis-Male cis-Male  Risk for HIV >5 partners in past 6 mos (regardless of condom use);Condomless vaginal or anal intercourse;Hx of STI Condomless vaginal or anal intercourse;>5 partners in past 6 mos (regardless of condom use) Condomless vaginal or anal intercourse  Sex Partners Men only Men only Men only  # sex partners past 3-6 mos 7-9 7-9 4-6  Sex activity preferences Insertive Insertive;Oral Insertive;Oral  Condom use No Yes No  % condom use - 10 -  Partners genders and ages - - M 25-29;M 20-24;M 15-19  Treated for STI? No No Yes  HIV symptoms? N/A Sore throat Sore throat  PrEP Eligibility Substantial risk for HIV HIV negative;CrCl >60 ml/min;Substantial risk for HIV HIV negative;CrCl >60 ml/min;Substantial risk for HIV    Labs:  SCr: Lab Results  Component Value Date   CREATININE 1.00 11/17/2018   CREATININE 0.93 10/13/2018   CREATININE 0.80 06/06/2018   CREATININE 0.89 11/12/2017   CREATININE 1.01 07/14/2017   HIV Lab Results  Component Value Date   HIV NON-REACTIVE 11/17/2018   HIV NON-REACTIVE 10/20/2018   HIV NON-REACTIVE 10/13/2018   HIV NONREACTIVE 04/15/2017   HIV NONREACTIVE 03/09/2017   Hepatitis B Lab Results  Component Value Date   HEPBSAB BORDERLINE (A) 10/20/2018   HEPBSAG NON-REACTIVE 10/20/2018   Hepatitis C Lab Results  Component Value Date   HEPCAB  NON-REACTIVE 10/20/2018   Hepatitis A Lab Results  Component Value Date   HAV NON-REACTIVE 10/20/2018   RPR and  STI Lab Results  Component Value Date   LABRPR NON-REACTIVE 10/13/2018   LABRPR NON REAC 04/15/2017   LABRPR NON REAC 03/09/2017   LABRPR NON REAC 02/03/2014    STI Results GC GC CT CT  11/17/2018 Negative - Negative -  11/17/2018 Negative - Negative -  10/20/2018 Negative - Negative -  03/09/2017 - CANCELED - CANCELED  03/09/2017 - CANCELED - -    Assessment: Spencer Love is here today for his 3 month PrEP follow-up appointment and lab work. He continues to take Descovy every day with no issues or missed doses. He has no changes in his insurance with the new year and no problems getting it from [facility].   He has had the same number of partners as last time with little condom use. He states that he got accused of giving someone herpes so he has been less sexually active lately. He has never been diagnosed with herpes and has never had an active lesion or sore. 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  He is complaining of jock itch today around his groin area.  He has tried clotrimazole but I told him to try some Lamisil OTC to see if it resolves.   No other issues or concerns. He has no documented immunity to Hepatitis A, so will start that vaccination series today.  His Hepatitis B antibody quantitative was 8, so will give him a booster to see if he develops full immunity. Will check a HIV antibody today and see him back in 3 months.   Plan: - HIV antibody today - Hepatitis B vaccine booster, Hepatitis A vaccine #1/2 - Descovy x 3 months if HIV negative - F/u with me 5/14 at 1130am  [name] L. Kuppelweiser, PharmD, BCIDP, AAHIVP, CPP Infectious Diseases Clinical Pharmacist [facility] 02/16/2019, 12:11 PM

## 2019-02-17 ENCOUNTER — Telehealth: Payer: Self-pay | Admitting: Pharmacist

## 2019-02-17 DIAGNOSIS — Z7252 High risk homosexual behavior: Secondary | ICD-10-CM

## 2019-02-17 LAB — HIV ANTIBODY (ROUTINE TESTING W REFLEX): HIV: NONREACTIVE

## 2019-02-17 MED ORDER — EMTRICITABINE-TENOFOVIR AF 200-25 MG PO TABS: 1.0000 | ORAL_TABLET | Freq: Every day | ORAL | 2 refills | Status: DC

## 2019-02-17 NOTE — Telephone Encounter (Signed)
Called patient to let him know that his HIV antibody was negative.  Will send in 3 more months of Descovy.  

## 2019-02-21 MED FILL — DESCOVY 200-25 MG TABS: 200-25 | 30 days supply | Qty: 30 | Fill #0

## 2019-02-24 ENCOUNTER — Other Ambulatory Visit: Payer: Self-pay

## 2019-02-24 DIAGNOSIS — F419 Anxiety disorder, unspecified: Secondary | ICD-10-CM

## 2019-02-24 MED ORDER — LORAZEPAM 1 MG PO TABS: ORAL_TABLET | ORAL | 0 refills | Status: DC

## 2019-03-17 MED FILL — DESCOVY 200-25 MG TABS: 200-25 | 30 days supply | Qty: 30 | Fill #1

## 2019-03-30 ENCOUNTER — Other Ambulatory Visit: Payer: Self-pay | Admitting: Internal Medicine

## 2019-03-30 DIAGNOSIS — F419 Anxiety disorder, unspecified: Secondary | ICD-10-CM

## 2019-03-30 DIAGNOSIS — K582 Mixed irritable bowel syndrome: Secondary | ICD-10-CM

## 2019-03-30 MED ORDER — HYOSCYAMINE SULFATE 0.125 MG SL SUBL: SUBLINGUAL_TABLET | SUBLINGUAL | 0 refills | Status: DC

## 2019-04-13 ENCOUNTER — Other Ambulatory Visit: Payer: Self-pay | Admitting: Pharmacist

## 2019-04-13 ENCOUNTER — Telehealth: Payer: Self-pay | Admitting: Pharmacy Technician

## 2019-04-13 DIAGNOSIS — Z7252 High risk homosexual behavior: Secondary | ICD-10-CM

## 2019-04-13 MED ORDER — EMTRICITABINE-TENOFOVIR AF 200-25 MG PO TABS: 1.0000 | ORAL_TABLET | Freq: Every day | ORAL | 0 refills | Status: DC

## 2019-04-13 NOTE — Progress Notes (Signed)
Patient's Descovy has to be transferred to the [facility]. Will transfer Rx and Kathie Rhodes will reach out to patient.

## 2019-04-13 NOTE — Telephone Encounter (Addendum)
I spoke with Spencer Love to update him on the status of his Descovy prescription transfer to [facility].  I called the pharmacy and gave them his copay card to cover the $50 copay for Descovy.  They said to order his medication, he must go to [email or web address], set up an account and select pharmacy.  Then they will mail the medication to him.  This was relayed to him and he stated he will do this promptly.  This will be for a 30 day supply.  Next month, once he comes in for labs and a new prescription is sent to them, it can be for a 90 day supply.  ID [id number] BIN [id number] PCN ACCESS GRP [id number]  Kathie Rhodes E. Dimas Aguas CPhT Special[facility] Pharmacy Patient [facility] Phone: [phone] Fax:  [phone]

## 2019-04-16 ENCOUNTER — Other Ambulatory Visit: Payer: Self-pay | Admitting: Internal Medicine

## 2019-04-18 ENCOUNTER — Encounter: Payer: Self-pay | Admitting: Internal Medicine

## 2019-04-18 NOTE — Progress Notes (Signed)
[facility]   Lucky Cowboy, M.D.     Dyanne Carrel. Steffanie Dunn, P.A.-C Judd Gaudier, DNP [facility]                [address]                [place], [place]. [address] Telephone [phone] Telefax [phone] Annual  Screening/Preventative Visit  & Comprehensive Evaluation & Examination  History of Present Illness:     This very nice 33 y.o. single WM  presents for a Screening /Preventative Visit & comprehensive evaluation and management of multiple medical co-morbidities.  Patient has been followed for HTN, HLD, T2_NIDDM  Prediabetes and Vitamin D Deficiency.  He also has hx/o OSA and after a 40# weight loss, he was able to d/c CPAP.     HTN predates circa 2014. Patient's BP has been controlled at home.  Today's BP is at goal -  118/64. Patient denies any cardiac symptoms as chest pain, palpitations, shortness of breath, dizziness or ankle swelling.     Patient's hyperlipidemia is controlled with diet and medications. Patient denies myalgias or other medication SE's. Last lipids were at goal albeit elevated Trig's: Lab Results  Component Value Date   CHOL 161 04/19/2019   HDL 24 (L) 04/19/2019   LDLCALC 98 04/19/2019   TRIG 293 (H) 04/19/2019   CHOLHDL 6.7 (H) 04/19/2019      Patient has hx/o prediabetes/Insulin Resistance with A1c ( 5.3% / elevated Insulin 152 / 2015) at which time he was started on Metformin for his Insulin Resistance.  Patient denies reactive hypoglycemic symptoms, visual blurring, diabetic polys or paresthesias. Last A1c was at goal: Lab Results  Component Value Date   HGBA1C 4.8 04/19/2019       Patient also has hx/o Low T "(212" / 2016 and "207" / 2017)  and had been on D-Testosterone injection, but has stopped due to perceived lack of benefits.     Finally, patient has history of Vitamin D Deficiency ("23" / 2015 and "24" / 2016) and last vitamin D was still slightly low (goal 70-100):   Lab Results  Component Value Date   VD25OH 40 04/19/2019   Current Outpatient Medications on File Prior to Visit  Medication Sig  . aspirin EC 81 MG tablet Take 81 mg by mouth 2 (two) times daily.  Marland Kitchen b complex vitamins tablet Take 1 tablet by mouth daily.  . cetirizine (ZYRTEC) 10 MG tablet Take 1 tablet (10 mg total) by mouth daily.  . Cholecalciferol (VITAMIN D3) 5000 units CAPS Take 1 capsule by mouth 2 (two) times daily.  . cloNIDine (CATAPRES) 0.1 MG tablet Take 0.1 mg by mouth 3 (three) times daily. Take 1/2 to 1 tablet by mouth three times a day  . emtricitabine-tenofovir AF (DESCOVY) 200-25 MG tablet Take 1 tablet by mouth daily.  . fish oil-omega-3 fatty acids 1000 MG capsule Take 1 g by mouth 2 (two) times daily.  . Flaxseed, Linseed, (FLAX SEED OIL PO) Take 1 tablet by mouth 2 (two) times daily.  Marland Kitchen gabapentin (NEURONTIN) 100 MG capsule Take 1 Capsule 3 x /Day as needed for Pain  . hydrochlorothiazide (HYDRODIURIL) 25 MG tablet Take 1 tablet daily for BP & Fluid Retention  . hyoscyamine (LEVSIN SL) 0.125 MG SL tablet Dissolve  1 to 2 tablets under tongue3 to 4 x day if needed for Nausea, Vomiting, Cramping, Bloating  or Diarrhea  . lisinopril (PRINIVIL,ZESTRIL) 20  MG tablet Take 1 tablet (20 mg total) by mouth 2 (two) times daily.  Marland Kitchen LORazepam (ATIVAN) 1 MG tablet Take 1/2-1 tablet 2 - 3 x /day ONLY if needed for Anxiety Attack &  limit to 5 days /week to avoid addiction  . Magnesium 400 MG TABS Take 400 mg by mouth daily.  . meloxicam (MOBIC) 15 MG tablet Take 1 tablet (15 mg total) by mouth daily.  . metFORMIN (GLUCOPHAGE-XR) 500 MG 24 hr tablet TAKE ONE TO TWO TABLETS BY MOUTH TWICE DAILY FOR DIABETES  . omeprazole (PRILOSEC) 20 MG capsule Take 2 capsules (40 mg total) by mouth at bedtime.  . propranolol (INDERAL) 80 MG tablet Take 1 tablet 3 x /day for BP  . zinc gluconate 50 MG tablet Take 50 mg by mouth daily.   No current facility-administered medications on file prior to  visit.    Allergies  Allergen Reactions  . Peanut-Containing Drug Products Anaphylaxis  . Ceclor [Cefaclor] Hives and Swelling  . Sulfa Antibiotics Hives   Past Medical History:  Diagnosis Date  . Anxiety   . Depression   . Hidradenitis suppurativa   . Hyperlipidemia   . Hypertension   . Prediabetes    Health Maintenance  Topic Date Due  . PNEUMOCOCCAL POLYSACCHARIDE VACCINE AGE 60-64 HIGH RISK  11/30/1989  . FOOT EXAM  11/30/1997  . OPHTHALMOLOGY EXAM  12/10/2017  . INFLUENZA VACCINE  07/30/2019  . HEMOGLOBIN A1C  10/19/2019  . TETANUS/TDAP  10/10/2025  . HIV Screening  Completed   Immunization History  Administered Date(s) Administered  . Hepatitis A, Adult 02/16/2019  . Hepatitis B, adult 02/16/2019  . Influenza Split 10/11/2015  . Influenza,inj,Quad PF,6+ Mos 10/13/2014  . PPD Test 10/11/2015, 04/19/2019  . Tdap 10/11/2015   Past Surgical History:  Procedure Laterality Date  . None     Family History  Problem Relation Age of Onset  . Heart disease Maternal Grandmother        Died from bypass surgery  . Heart disease Mother        Bicuspid aortic valve  . Heart disease Sister        Bicuspid aortic valve  . Colitis Sister   . Diabetes Sister   . Heart disease Father        Bicuspid aortic valve  . Breast cancer Paternal Grandmother   . Diabetes Paternal Grandmother   . Esophageal cancer Paternal Grandfather   . Colon cancer Paternal Aunt    Social History   Socioeconomic History  . Marital status: Single    Spouse name: Not on file  . Number of children: 0  . Years of education: Not on file  . Highest education level: Not on file  Occupational History  . Occupation: Research scientist (life sciences): [employer]  Tobacco Use  . Smoking status: Current Every Day Smoker    Packs/day: 0.60    Years: 10.00    Pack years: 6.00    Types: Cigarettes  . Smokeless tobacco: Never Used  Substance and Sexual Activity  . Alcohol use: Yes    Comment: social  .  Drug use: Yes    Types: Marijuana  . Sexual activity:   Social History Narrative   Lives alone.      ROS Constitutional: Denies fever, chills, weight loss/gain, headaches, insomnia,  night sweats or change in appetite. Does c/o fatigue. Eyes: Denies redness, blurred vision, diplopia, discharge, itchy or watery eyes.  ENT: Denies discharge, congestion,  post nasal drip, epistaxis, sore throat, earache, hearing loss, dental pain, Tinnitus, Vertigo, Sinus pain or snoring.  Cardio: Denies chest pain, palpitations, irregular heartbeat, syncope, dyspnea, diaphoresis, orthopnea, PND, claudication or edema Respiratory: denies cough, dyspnea, DOE, pleurisy, hoarseness, laryngitis or wheezing.  Gastrointestinal: Denies dysphagia, heartburn, reflux, water brash, pain, cramps, nausea, vomiting, bloating, diarrhea, constipation, hematemesis, melena, hematochezia, jaundice or hemorrhoids Genitourinary: Denies dysuria, frequency, urgency, nocturia, hesitancy, discharge, hematuria or flank pain Musculoskeletal: Denies arthralgia, myalgia, stiffness, Jt. Swelling, pain, limp or strain/sprain. Denies Falls. Skin: Denies puritis, rash, hives, warts, acne, eczema or change in skin lesion Neuro: No weakness, tremor, incoordination, spasms, paresthesia or pain Psychiatric: Denies confusion, memory loss or sensory loss. Denies Depression. Endocrine: Denies change in weight, skin, hair change, nocturia, and paresthesia, diabetic polys, visual blurring or hyper / hypo glycemic episodes.  Heme/Lymph: No excessive bleeding, bruising or enlarged lymph nodes.  Physical Exam  BP 118/64   Pulse 68   Temp (!) 97.2 F (36.2 C)   Resp 16   Ht 6\' 4"  (1.93 m)   Wt 283 lb 6.4 oz (128.5 kg)   BMI 34.50 kg/m   General Appearance: Over  nourished and well groomed and in no apparent distress.  Eyes: PERRLA, EOMs, conjunctiva no swelling or erythema, normal fundi and vessels. Sinuses: No frontal/maxillary  tenderness ENT/Mouth: EACs patent / TMs  nl. Nares clear without erythema, swelling, mucoid exudates. Oral hygiene is good. No erythema, swelling, or exudate. Tongue normal, non-obstructing. Tonsils not swollen or erythematous. Hearing normal.  Neck: Supple, thyroid not palpable. No bruits, nodes or JVD. Respiratory: Respiratory effort normal.  BS equal and clear bilateral without rales, rhonci, wheezing or stridor. Cardio: Heart sounds are normal with regular rate and rhythm and no murmurs, rubs or gallops. Peripheral pulses are normal and equal bilaterally without edema. No aortic or femoral bruits. Chest: symmetric with normal excursions and percussion.  Abdomen: Soft, with Nl bowel sounds. Nontender, no guarding, rebound, hernias, masses, or organomegaly.  Lymphatics: Non tender without lymphadenopathy.  Musculoskeletal: Full ROM all peripheral extremities, joint stability, 5/5 strength, and normal gait. Skin: Warm and dry without rashes, lesions, cyanosis, clubbing or  ecchymosis.  Neuro: Cranial nerves intact, reflexes equal bilaterally. Normal muscle tone, no cerebellar symptoms. Sensation intact.  Pysch: Alert and oriented X 3 with normal affect, insight and judgment appropriate.   Assessment and Plan  1. Annual Preventative/Screening Exam   2. Essential hypertension  - EKG 12-Lead - CBC with Differential/Platelet - COMPLETE METABOLIC PANEL WITH GFR - Magnesium - TSH - Microalbumin / creatinine urine ratio  3. Hyperlipidemia, mixed  - EKG 12-Lead - Lipid panel - TSH  4. Abnormal glucose  - EKG 12-Lead - Hemoglobin A1c - Insulin, random  5. Vitamin D deficiency  - VITAMIN D 25 Hydroxyl  6. Prediabetes  - EKG 12-Lead - Hemoglobin A1c - Insulin, random  7. Gastroesophageal reflux disease  - CBC with Differential/Platelet  8. Testosterone deficiency  - Testosterone  9. Screening-pulmonary TB  - TB Skin Test  10. Screening for colorectal cancer  - had  Colon 06/03/2018 - Dr Adela Lank  11. Screening for ischemic heart disease  - EKG 12-Lead  12. Family history of ischemic heart disease  - EKG 12-Lead  13. Smoker  - EKG 12-Lead  14. Fatigue  - Iron,Total/Total Iron Binding Cap - Vitamin B12 - Testosterone - CBC with Differential/Platelet - TSH  15. Medication management  - Urinalysis, Routine w reflex microscopic - CBC with Differential/Platelet - COMPLETE  METABOLIC PANEL WITH GFR - Magnesium - Lipid panel - TSH - Hemoglobin A1c - Insulin, random - VITAMIN D 25 Hydroxyl - Microalbumin / creatinine urine ratio         Patient was counseled in prudent diet, weight control to achieve/maintain BMI less than 25, BP monitoring, regular exercise and medications as discussed.  Discussed med effects and SE's. Routine screening labs and tests as requested with regular follow-up as recommended. I discussed the assessment and treatment plan as above with the patient. The patient was provided an opportunity to ask questions and all were answered. The patient agreed with the plan and demonstrated an understanding of the instructions.Over 40 minutes of exam, counseling, chart review and high complex critical decision making was performed  Marinus MawWilliam D Lanisha Stepanian, MD

## 2019-04-18 NOTE — Patient Instructions (Signed)
Coronavirus (COVID-19) Are you at risk?  Are you at risk for the Coronavirus (COVID-19)?  To be considered HIGH RISK for Coronavirus (COVID-19), you have to meet the following criteria:  . Traveled to [place], [place], [place], [place] or [place]; or in the [place] to [place], [place], [place]  . or [place]; and have fever, cough, and shortness of breath within the last 2 weeks of travel OR . Been in close contact with a person diagnosed with COVID-19 within the last 2 weeks and have  . fever, cough,and shortness of breath .  . IF YOU DO NOT MEET THESE CRITERIA, YOU ARE CONSIDERED LOW RISK FOR COVID-19.  What to do if you are HIGH RISK for COVID-19?  . If you are having a medical emergency, call 911. . Seek medical care right away. Before you go to a doctor's office, urgent care or emergency department, .  call ahead and tell them about your recent travel, contact with someone diagnosed with COVID-19  .  and your symptoms.  . You should receive instructions from your physician's office regarding next steps of care.  . When you arrive at healthcare provider, tell the healthcare staff immediately you have returned from  . visiting [place], [place], [place], [place] or [place]; or traveled in the [place] to [place], [place],  . [place] or [place] in the last two weeks or you have been in close contact with a person diagnosed with  . COVID-19 in the last 2 weeks.   . Tell the health care staff about your symptoms: fever, cough and shortness of breath. . After you have been seen by a medical provider, you will be either: o Tested for (COVID-19) and discharged home on quarantine except to seek medical care if  o symptoms worsen, and asked to  - Stay home and avoid contact with others until you get your results (4-5 days)  - Avoid travel on public transportation if possible (such as bus, train, or airplane) or o Sent to the Emergency Department by EMS for evaluation,  COVID-19 testing  and  o possible admission depending on your condition and test results.  What to do if you are LOW RISK for COVID-19?  Reduce your risk of any infection by using the same precautions used for avoiding the common cold or flu:  . Wash your hands often with soap and warm water for at least 20 seconds.  If soap and water are not readily available,  . use an alcohol-based hand sanitizer with at least 60% alcohol.  . If coughing or sneezing, cover your mouth and nose by coughing or sneezing into the elbow areas of your shirt or coat, .  into a tissue or into your sleeve (not your hands). . Avoid shaking hands with others and consider head nods or verbal greetings only. . Avoid touching your eyes, nose, or mouth with unwashed hands.  . Avoid close contact with people who are sick. . Avoid places or events with large numbers of people in one location, like concerts or sporting events. . Carefully consider travel plans you have or are making. . If you are planning any travel outside or inside the [place], visit the CDC's Travelers' Health webpage for the latest health notices. . If you have some symptoms but not all symptoms, continue to monitor at home and seek medical attention  . if your symptoms worsen. . If you are having a medical emergency, call 911. >>>>>>>>>>>>>>>>>>>>>>>>   Preventive Care for Adults  A healthy lifestyle and preventive care can promote health and wellness. Preventive health guidelines for men include the following key practices:  A routine yearly physical is a good way to check with your health care provider about your health and preventative screening. It is a chance to share any concerns and updates on your health and to receive a thorough exam.  Visit your dentist for a routine exam and preventative care every 6 months. Brush your teeth twice a day and floss once a day. Good oral hygiene prevents tooth decay and gum disease.  The frequency of eye exams is  based on your age, health, family medical history, use of contact lenses, and other factors. Follow your health care provider's recommendations for frequency of eye exams.  Eat a healthy diet. Foods such as vegetables, fruits, whole grains, low-fat dairy products, and lean protein foods contain the nutrients you need without too many calories. Decrease your intake of foods high in solid fats, added sugars, and salt. Eat the right amount of calories for you. Get information about a proper diet from your health care provider, if necessary.  Regular physical exercise is one of the most important things you can do for your health. Most adults should get at least 150 minutes of moderate-intensity exercise (any activity that increases your heart rate and causes you to sweat) each week. In addition, most adults need muscle-strengthening exercises on 2 or more days a week.  Maintain a healthy weight. The body mass index (BMI) is a screening tool to identify possible weight problems. It provides an estimate of body fat based on height and weight. Your health care provider can find your BMI and can help you achieve or maintain a healthy weight. For adults 20 years and older:  A BMI below 18.5 is considered underweight.  A BMI of 18.5 to 24.9 is normal.  A BMI of 25 to 29.9 is considered overweight.  A BMI of 30 and above is considered obese.  Maintain normal blood lipids and cholesterol levels by exercising and minimizing your intake of saturated fat. Eat a balanced diet with plenty of fruit and vegetables. Blood tests for lipids and cholesterol should begin at age 20 and be repeated every 5 years. If your lipid or cholesterol levels are high, you are over 50, or you are at high risk for heart disease, you may need your cholesterol levels checked more frequently. Ongoing high lipid and cholesterol levels should be treated with medicines if diet and exercise are not working.  If you smoke, find out from your  health care provider how to quit. If you do not use tobacco, do not start.  Lung cancer screening is recommended for adults aged 55-80 years who are at high risk for developing lung cancer because of a history of smoking. A yearly low-dose CT scan of the lungs is recommended for people who have at least a 30-pack-year history of smoking and are a current smoker or have quit within the past 15 years. A pack year of smoking is smoking an average of 1 pack of cigarettes a day for 1 year (for example: 1 pack a day for 30 years or 2 packs a day for 15 years). Yearly screening should continue until the smoker has stopped smoking for at least 15 years. Yearly screening should be stopped for people who develop a health problem that would prevent them from having lung cancer treatment.  If you choose to drink alcohol,   do not have more than 2 drinks per day. One drink is considered to be 12 ounces (355 mL) of beer, 5 ounces (148 mL) of wine, or 1.5 ounces (44 mL) of liquor.  High blood pressure causes heart disease and increases the risk of stroke. Your blood pressure should be checked. Ongoing high blood pressure should be treated with medicines, if weight loss and exercise are not effective.  If you are 45-79 years old, ask your health care provider if you should take aspirin to prevent heart disease.  Diabetes screening involves taking a blood sample to check your fasting blood sugar level. Testing should be considered at a younger age or be carried out more frequently if you are overweight and have at least 1 risk factor for diabetes.  Colorectal cancer can be detected and often prevented. Most routine colorectal cancer screening begins at the age of 50 and continues through age 75. However, your health care provider may recommend screening at an earlier age if you have risk factors for colon cancer. On a yearly basis, your health care provider may provide home test kits to check for hidden blood in the stool.  Use of a small camera at the end of a tube to directly examine the colon (sigmoidoscopy or colonoscopy) can detect the earliest forms of colorectal cancer. Talk to your health care provider about this at age 50, when routine screening begins. Direct exam of the colon should be repeated every 5-10 years through age 75, unless early forms of precancerous polyps or small growths are found.  Screening for abdominal aortic aneurysm (AAA)  are recommended for persons over age 50 who have history of hypertensionor who are current or former smokers.  Talk with your health care provider about prostate cancer screening.  Testicular cancer screening is recommended for adult males. Screening includes self-exam, a health care provider exam, and other screening tests. Consult with your health care provider about any symptoms you have or any concerns you have about testicular cancer.  Use sunscreen. Apply sunscreen liberally and repeatedly throughout the day. You should seek shade when your shadow is shorter than you. Protect yourself by wearing long sleeves, pants, a wide-brimmed hat, and sunglasses year round, whenever you are outdoors.  Once a month, do a whole-body skin exam, using a mirror to look at the skin on your back. Tell your health care provider about new moles, moles that have irregular borders, moles that are larger than a pencil eraser, or moles that have changed in shape or color.  Stay current with required vaccines (immunizations).  Influenza vaccine. All adults should be immunized every year.  Tetanus, diphtheria, and acellular pertussis (Td, Tdap) vaccine. An adult who has not previously received Tdap or who does not know his vaccine status should receive 1 dose of Tdap. This initial dose should be followed by tetanus and diphtheria toxoids (Td) booster doses every 10 years. Adults with an unknown or incomplete history of completing a 3-dose immunization series with Td-containing vaccines  should begin or complete a primary immunization series including a Tdap dose. Adults should receive a Td booster every 10 years.  Zoster vaccine. One dose is recommended for adults aged 60 years or older unless certain conditions are present.    Pneumococcal 13-valent conjugate (PCV13) vaccine. When indicated, a person who is uncertain of his immunization history and has no record of immunization should receive the PCV13 vaccine. An adult aged 19 years or older who has certain medical conditions and has   not been previously immunized should receive 1 dose of PCV13 vaccine. This PCV13 should be followed with a dose of pneumococcal polysaccharide (PPSV23) vaccine. The PPSV23 vaccine dose should be obtained at least 8 weeks after the dose of PCV13 vaccine. An adult aged 19 years or older who has certain medical conditions and previously received 1 or more doses of PPSV23 vaccine should receive 1 dose of PCV13. The PCV13 vaccine dose should be obtained 1 or more years after the last PPSV23 vaccine dose.    Pneumococcal polysaccharide (PPSV23) vaccine. When PCV13 is also indicated, PCV13 should be obtained first. All adults aged 65 years and older should be immunized. An adult younger than age 65 years who has certain medical conditions should be immunized. Any person who resides in a nursing home or long-term care facility should be immunized. An adult smoker should be immunized. People with an immunocompromised condition and certain other conditions should receive both PCV13 and PPSV23 vaccines. People with human immunodeficiency virus (HIV) infection should be immunized as soon as possible after diagnosis. Immunization during chemotherapy or radiation therapy should be avoided. Routine use of PPSV23 vaccine is not recommended for American Indians, [place] Natives, or people younger than 65 years unless there are medical conditions that require PPSV23 vaccine. When indicated, people who have unknown  immunization and have no record of immunization should receive PPSV23 vaccine. One-time revaccination 5 years after the first dose of PPSV23 is recommended for people aged 19-64 years who have chronic kidney failure, nephrotic syndrome, asplenia, or immunocompromised conditions. People who received 1-2 doses of PPSV23 before age 65 years should receive another dose of PPSV23 vaccine at age 65 years or later if at least 5 years have passed since the previous dose. Doses of PPSV23 are not needed for people immunized with PPSV23 at or after age 65 years.  Hepatitis A vaccine. Adults who wish to be protected from this disease, have certain high-risk conditions, work with hepatitis A-infected animals, work in hepatitis A research labs, or travel to or work in countries with a high rate of hepatitis A should be immunized. Adults who were previously unvaccinated and who anticipate close contact with an international adoptee during the first 60 days after arrival in the [place] from a country with a high rate of hepatitis A should be immunized.  Hepatitis B vaccine. Adults should be immunized if they wish to be protected from this disease, have certain high-risk conditions, may be exposed to blood or other infectious body fluids, are household contacts or sex partners of hepatitis B positive people, are clients or workers in certain care facilities, or travel to or work in countries with a high rate of hepatitis B.  Preventive Service / Frequency  Ages 19 to 39  Blood pressure check.  Lipid and cholesterol check.  Hepatitis C blood test.** / For any individual with known risks for hepatitis C.  Skin self-exam. / Monthly.  Influenza vaccine. / Every year.  Tetanus, diphtheria, and acellular pertussis (Tdap, Td) vaccine.** / Consult your health care provider. 1 dose of Td every 10 years.  HPV vaccine. / 3 doses over 6 months, if 26 or younger.  Measles, mumps, rubella (MMR) vaccine.** / You need  at least 1 dose of MMR if you were born in 1957 or later. You may also need a second dose.  Pneumococcal 13-valent conjugate (PCV13) vaccine.** / Consult your health care provider.  Pneumococcal polysaccharide (PPSV23) vaccine.** / 1 to 2 doses if you smoke cigarettes   or if you have certain conditions.  Meningococcal vaccine.** / 1 dose if you are age 19 to 21 years and a first-year college student living in a residence hall, or have one of several medical conditions. You may also need additional booster doses.  Hepatitis A vaccine.** / Consult your health care provider.  Hepatitis B vaccine.** / Consult your health care provider. +++++++++ Recommend Adult Low Dose Aspirin or  coated  Aspirin 81 mg daily  To reduce risk of Colon Cancer 20 %,  Skin Cancer 26 % ,  Melanoma 46%  and  Pancreatic cancer 60% ++++++++++++++++++ Vitamin D goal  is between 70-100.  Please make sure that you are taking your Vitamin D as directed.  It is very important as a natural anti-inflammatory  helping hair, skin, and nails, as well as reducing stroke and heart attack risk.  It helps your bones and helps with mood. It also decreases numerous cancer risks so please take it as directed.  Low Vit D is associated with a 200-300% higher risk for CANCER  and 200-300% higher risk for HEART   ATTACK  &  STROKE.   ...................................... It is also associated with higher death rate at younger ages,  autoimmune diseases like Rheumatoid arthritis, Lupus, Multiple Sclerosis.    Also many other serious conditions, like depression, Alzheimer's Dementia, infertility, muscle aches, fatigue, fibromyalgia - just to name a few. +++++++++++++++++++++ Recommend the book "The END of DIETING" by Dr Joel Fuhrman  & the book "The END of DIABETES " by Dr Joel Fuhrman At [employer] - get book & Audio CD's    Being diabetic has a  300% increased risk for heart attack, stroke, cancer, and alzheimer- type vascular  dementia. It is very important that you work harder with diet by avoiding all foods that are white. Avoid white rice (brown & wild rice is OK), white potatoes (sweetpotatoes in moderation is OK), White bread or wheat bread or anything made out of white flour like bagels, donuts, rolls, buns, biscuits, cakes, pastries, cookies, pizza crust, and pasta (made from white flour & egg whites) - vegetarian pasta or spinach or wheat pasta is OK. Multigrain breads like Arnold's or Pepper[employer], or multigrain sandwich thins or flatbreads.  Diet, exercise and weight loss can reverse and cure diabetes in the early stages.  Diet, exercise and weight loss is very important in the control and prevention of complications of diabetes which affects every system in your body, ie. Brain - dementia/stroke, eyes - glaucoma/blindness, heart - heart attack/heart failure, kidneys - dialysis, stomach - gastric paralysis, intestines - malabsorption, nerves - severe painful neuritis, circulation - gangrene & loss of a leg(s), and finally cancer and Alzheimers.    I recommend avoid fried & greasy foods,  sweets/candy, white rice (brown or wild rice or Quinoa is OK), white potatoes (sweet potatoes are OK) - anything made from white flour - bagels, doughnuts, rolls, buns, biscuits,white and wheat breads, pizza crust and traditional pasta made of white flour & egg white(vegetarian pasta or spinach or wheat pasta is OK).  Multi-grain bread is OK - like multi-grain flat bread or sandwich thins. Avoid alcohol in excess. Exercise is also important.    Eat all the vegetables you want - avoid meat, especially red meat and dairy - especially cheese.  Cheese is the most concentrated form of trans-fats which is the worst thing to clog up our arteries. Veggie cheese is OK which can be found in the fresh produce section   at [employer] or [employer] or [employer]  +++++++++++++++++++ DASH Eating Plan  DASH stands for "Dietary Approaches to Stop  Hypertension."   The DASH eating plan is a healthy eating plan that has been shown to reduce high blood pressure (hypertension). Additional health benefits may include reducing the risk of type 2 diabetes mellitus, heart disease, and stroke. The DASH eating plan may also help with weight loss. WHAT DO I NEED TO KNOW ABOUT THE DASH EATING PLAN? For the DASH eating plan, you will follow these general guidelines:  Choose foods with a percent daily value for sodium of less than 5% (as listed on the food label).  Use salt-free seasonings or herbs instead of table salt or sea salt.  Check with your health care provider or pharmacist before using salt substitutes.  Eat lower-sodium products, often labeled as "lower sodium" or "no salt added."  Eat fresh foods.  Eat more vegetables, fruits, and low-fat dairy products.  Choose whole grains. Look for the word "whole" as the first word in the ingredient list.  Choose fish   Limit sweets, desserts, sugars, and sugary drinks.  Choose heart-healthy fats.  Eat veggie cheese   Eat more home-cooked food and less restaurant, buffet, and fast food.  Limit fried foods.  Cook foods using methods other than frying.  Limit canned vegetables. If you do use them, rinse them well to decrease the sodium.  When eating at a restaurant, ask that your food be prepared with less salt, or no salt if possible.                      WHAT FOODS CAN I EAT? Read Dr Joel Fuhrman's books on The End of Dieting & The End of Diabetes  Grains Whole grain or whole wheat bread. Brown rice. Whole grain or whole wheat pasta. Quinoa, bulgur, and whole grain cereals. Low-sodium cereals. Corn or whole wheat flour tortillas. Whole grain cornbread. Whole grain crackers. Low-sodium crackers.  Vegetables Fresh or frozen vegetables (raw, steamed, roasted, or grilled). Low-sodium or reduced-sodium tomato and vegetable juices. Low-sodium or reduced-sodium tomato sauce and paste.  Low-sodium or reduced-sodium canned vegetables.   Fruits All fresh, canned (in natural juice), or frozen fruits.  Protein Products  All fish and seafood.  Dried beans, peas, or lentils. Unsalted nuts and seeds. Unsalted canned beans.  Dairy Low-fat dairy products, such as skim or 1% milk, 2% or reduced-fat cheeses, low-fat ricotta or cottage cheese, or plain low-fat yogurt. Low-sodium or reduced-sodium cheeses.  Fats and Oils Tub margarines without trans fats. Light or reduced-fat mayonnaise and salad dressings (reduced sodium). Avocado. Safflower, olive, or canola oils. Natural peanut or almond butter.  Other Unsalted popcorn and pretzels. The items listed above may not be a complete list of recommended foods or beverages. Contact your dietitian for more options.  +++++++++++++++++++  WHAT FOODS ARE NOT RECOMMENDED? Grains/ White flour or wheat flour White bread. White pasta. White rice. Refined cornbread. Bagels and croissants. Crackers that contain trans fat.  Vegetables  Creamed or fried vegetables. Vegetables in a . Regular canned vegetables. Regular canned tomato sauce and paste. Regular tomato and vegetable juices.  Fruits Dried fruits. Canned fruit in light or heavy syrup. Fruit juice.  Meat and Other Protein Products Meat in general - RED meat & White meat.  Fatty cuts of meat. Ribs, chicken wings, all processed meats as bacon, sausage, bologna, salami, fatback, hot dogs, bratwurst and packaged luncheon meats.  Dairy Whole or   2% milk, cream, half-and-half, and cream cheese. Whole-fat or sweetened yogurt. Full-fat cheeses or blue cheese. Non-dairy creamers and whipped toppings. Processed cheese, cheese spreads, or cheese curds.  Condiments Onion and garlic salt, seasoned salt, table salt, and sea salt. Canned and packaged gravies. Worcestershire sauce. Tartar sauce. Barbecue sauce. Teriyaki sauce. Soy sauce, including reduced sodium. Steak sauce. Fish sauce. Oyster  sauce. Cocktail sauce. Horseradish. Ketchup and mustard. Meat flavorings and tenderizers. Bouillon cubes. Hot sauce. Tabasco sauce. Marinades. Taco seasonings. Relishes.  Fats and Oils Butter, stick margarine, lard, shortening and bacon fat. Coconut, palm kernel, or palm oils. Regular salad dressings.  Pickles and olives. Salted popcorn and pretzels.  The items listed above may not be a complete list of foods and beverages to avoid.    

## 2019-04-19 ENCOUNTER — Other Ambulatory Visit: Payer: Self-pay | Admitting: *Deleted

## 2019-04-19 ENCOUNTER — Other Ambulatory Visit: Payer: Self-pay

## 2019-04-19 ENCOUNTER — Ambulatory Visit: Payer: 59 | Admitting: Internal Medicine

## 2019-04-19 VITALS — BP 118/64 | HR 68 | Temp 97.2°F | Resp 16 | Ht 76.0 in | Wt 283.4 lb

## 2019-04-19 DIAGNOSIS — Z0000 Encounter for general adult medical examination without abnormal findings: Secondary | ICD-10-CM

## 2019-04-19 DIAGNOSIS — F329 Major depressive disorder, single episode, unspecified: Secondary | ICD-10-CM

## 2019-04-19 DIAGNOSIS — E782 Mixed hyperlipidemia: Secondary | ICD-10-CM

## 2019-04-19 DIAGNOSIS — I10 Essential (primary) hypertension: Secondary | ICD-10-CM | POA: Diagnosis not present

## 2019-04-19 DIAGNOSIS — E559 Vitamin D deficiency, unspecified: Secondary | ICD-10-CM

## 2019-04-19 DIAGNOSIS — F17200 Nicotine dependence, unspecified, uncomplicated: Secondary | ICD-10-CM

## 2019-04-19 DIAGNOSIS — Z111 Encounter for screening for respiratory tuberculosis: Secondary | ICD-10-CM

## 2019-04-19 DIAGNOSIS — Z136 Encounter for screening for cardiovascular disorders: Secondary | ICD-10-CM

## 2019-04-19 DIAGNOSIS — Z0001 Encounter for general adult medical examination with abnormal findings: Secondary | ICD-10-CM

## 2019-04-19 DIAGNOSIS — Z79899 Other long term (current) drug therapy: Secondary | ICD-10-CM

## 2019-04-19 DIAGNOSIS — R5383 Other fatigue: Secondary | ICD-10-CM

## 2019-04-19 DIAGNOSIS — R7303 Prediabetes: Secondary | ICD-10-CM

## 2019-04-19 DIAGNOSIS — Z8249 Family history of ischemic heart disease and other diseases of the circulatory system: Secondary | ICD-10-CM

## 2019-04-19 DIAGNOSIS — R7309 Other abnormal glucose: Secondary | ICD-10-CM

## 2019-04-19 DIAGNOSIS — E349 Endocrine disorder, unspecified: Secondary | ICD-10-CM

## 2019-04-19 DIAGNOSIS — Z1212 Encounter for screening for malignant neoplasm of rectum: Secondary | ICD-10-CM

## 2019-04-19 DIAGNOSIS — Z1211 Encounter for screening for malignant neoplasm of colon: Secondary | ICD-10-CM

## 2019-04-19 DIAGNOSIS — K219 Gastro-esophageal reflux disease without esophagitis: Secondary | ICD-10-CM

## 2019-04-19 DIAGNOSIS — F32A Depression, unspecified: Secondary | ICD-10-CM

## 2019-04-19 MED ORDER — BUPROPION HCL ER (XL) 300 MG PO TB24: ORAL_TABLET | ORAL | 1 refills | Status: DC

## 2019-04-20 LAB — CBC WITH DIFFERENTIAL/PLATELET
Absolute Monocytes: 519 cells/uL (ref 200–950)
Basophils Absolute: 48 cells/uL (ref 0–200)
Basophils Relative: 0.9 %
Eosinophils Absolute: 111 cells/uL (ref 15–500)
Eosinophils Relative: 2.1 %
HCT: 42.3 % (ref 38.5–50.0)
Hemoglobin: 14.9 g/dL (ref 13.2–17.1)
Lymphs Abs: 1314 cells/uL (ref 850–3900)
MCH: 30.9 pg (ref 27.0–33.0)
MCHC: 35.2 g/dL (ref 32.0–36.0)
MCV: 87.8 fL (ref 80.0–100.0)
MPV: 11.1 fL (ref 7.5–12.5)
Monocytes Relative: 9.8 %
Neutro Abs: 3307 cells/uL (ref 1500–7800)
Neutrophils Relative %: 62.4 %
Platelets: 166 10*3/uL (ref 140–400)
RBC: 4.82 10*6/uL (ref 4.20–5.80)
RDW: 13.1 % (ref 11.0–15.0)
Total Lymphocyte: 24.8 %
WBC: 5.3 10*3/uL (ref 3.8–10.8)

## 2019-04-20 LAB — COMPLETE METABOLIC PANEL WITH GFR
AG Ratio: 2.4 (calc) (ref 1.0–2.5)
ALT: 28 U/L (ref 9–46)
AST: 22 U/L (ref 10–40)
Albumin: 4.7 g/dL (ref 3.6–5.1)
Alkaline phosphatase (APISO): 51 U/L (ref 36–130)
BUN: 15 mg/dL (ref 7–25)
CO2: 29 mmol/L (ref 20–32)
Calcium: 9.1 mg/dL (ref 8.6–10.3)
Chloride: 101 mmol/L (ref 98–110)
Creat: 0.92 mg/dL (ref 0.60–1.35)
GFR, Est African American: 128 mL/min/{1.73_m2} (ref >=60)
GFR, Est Non African American: 110 mL/min/{1.73_m2} (ref >=60)
Globulin: 2 g/dL (calc) (ref 1.9–3.7)
Glucose, Bld: 98 mg/dL (ref 65–99)
Potassium: 4 mmol/L (ref 3.5–5.3)
Sodium: 138 mmol/L (ref 135–146)
Total Bilirubin: 0.7 mg/dL (ref 0.2–1.2)
Total Protein: 6.7 g/dL (ref 6.1–8.1)

## 2019-04-20 LAB — TESTOSTERONE: Testosterone: 220 ng/dL — ABNORMAL LOW (ref 250–827)

## 2019-04-20 LAB — LIPID PANEL
Cholesterol: 161 mg/dL (ref <200)
HDL: 24 mg/dL — ABNORMAL LOW (ref >=40)
LDL Cholesterol (Calc): 98 mg/dL (calc)
Non-HDL Cholesterol (Calc): 137 mg/dL (calc) — ABNORMAL HIGH (ref <130)
Total CHOL/HDL Ratio: 6.7 (calc) — ABNORMAL HIGH (ref <5.0)
Triglycerides: 293 mg/dL — ABNORMAL HIGH (ref <150)

## 2019-04-20 LAB — HEMOGLOBIN A1C
Hgb A1c MFr Bld: 4.8 % of total Hgb (ref <5.7)
Mean Plasma Glucose: 91 (calc)
eAG (mmol/L): 5 (calc)

## 2019-04-20 LAB — IRON, TOTAL/TOTAL IRON BINDING CAP
%SAT: 32 % (calc) (ref 20–48)
Iron: 93 ug/dL (ref 50–180)
TIBC: 287 mcg/dL (calc) (ref 250–425)

## 2019-04-20 LAB — TSH: TSH: 0.93 mIU/L (ref 0.40–4.50)

## 2019-04-20 LAB — VITAMIN D 25 HYDROXY (VIT D DEFICIENCY, FRACTURES): Vit D, 25-Hydroxy: 40 ng/mL (ref 30–100)

## 2019-04-20 LAB — VITAMIN B12: Vitamin B-12: 379 pg/mL (ref 200–1100)

## 2019-04-20 LAB — MAGNESIUM: Magnesium: 2.1 mg/dL (ref 1.5–2.5)

## 2019-04-20 LAB — INSULIN, RANDOM: Insulin: 14 u[IU]/mL

## 2019-04-22 ENCOUNTER — Encounter: Payer: Self-pay | Admitting: Internal Medicine

## 2019-05-09 ENCOUNTER — Other Ambulatory Visit: Payer: Self-pay | Admitting: Pharmacist

## 2019-05-09 DIAGNOSIS — Z7252 High risk homosexual behavior: Secondary | ICD-10-CM

## 2019-05-09 MED ORDER — EMTRICITABINE-TENOFOVIR AF 200-25 MG PO TABS: 1.0000 | ORAL_TABLET | Freq: Every day | ORAL | 0 refills | Status: DC

## 2019-05-12 ENCOUNTER — Ambulatory Visit: Payer: 59 | Admitting: Pharmacist

## 2019-05-12 ENCOUNTER — Other Ambulatory Visit: Payer: Self-pay

## 2019-05-12 DIAGNOSIS — F419 Anxiety disorder, unspecified: Secondary | ICD-10-CM

## 2019-05-12 MED ORDER — LORAZEPAM 1 MG PO TABS: ORAL_TABLET | ORAL | 0 refills | Status: DC

## 2019-06-01 ENCOUNTER — Ambulatory Visit: Payer: 59 | Admitting: Pharmacist

## 2019-06-04 ENCOUNTER — Other Ambulatory Visit: Payer: Self-pay | Admitting: Internal Medicine

## 2019-06-04 ENCOUNTER — Other Ambulatory Visit: Payer: Self-pay | Admitting: Physician Assistant

## 2019-06-04 DIAGNOSIS — F419 Anxiety disorder, unspecified: Secondary | ICD-10-CM

## 2019-07-15 ENCOUNTER — Other Ambulatory Visit: Payer: Self-pay | Admitting: Internal Medicine

## 2019-07-15 MED ORDER — HYDROCHLOROTHIAZIDE 25 MG PO TABS: ORAL_TABLET | ORAL | 3 refills | Status: DC

## 2019-07-15 MED ORDER — MELOXICAM 15 MG PO TABS: ORAL_TABLET | ORAL | 3 refills | Status: DC

## 2019-07-18 ENCOUNTER — Other Ambulatory Visit: Payer: Self-pay | Admitting: Internal Medicine

## 2019-07-18 DIAGNOSIS — F419 Anxiety disorder, unspecified: Secondary | ICD-10-CM

## 2019-07-18 MED ORDER — LORAZEPAM 1 MG PO TABS: ORAL_TABLET | ORAL | 0 refills | Status: DC

## 2019-07-25 DIAGNOSIS — E8881 Metabolic syndrome: Secondary | ICD-10-CM | POA: Insufficient documentation

## 2019-07-25 NOTE — Progress Notes (Signed)
FOLLOW UP  Assessment and Plan:   Hypertension Well controlled with current medications  Monitor blood pressure at home; patient to call if consistently greater than 130/80 Continue DASH diet.   Reminder to go to the ER if any CP, SOB, nausea, dizziness, severe HA, changes vision/speech, left arm numbness and tingling and jaw pain.  Cholesterol Currently at LDL goal; lifestyle discussed for trigs, consider fenofibrate if trending up Continue low cholesterol diet and exercise.  Check lipid panel.   Insulin resistance Continue medication: metformin  Continue diet and exercise.  Perform daily foot/skin check, notify office of any concerning changes.  A1C annually at CPE; has been well controlled; monitor weight, serum glucose  Obesity with co morbidities Long discussion about weight loss, diet, and exercise Recommended diet heavy in fruits and veggies and low in animal meats, cheeses, and dairy products, appropriate calorie intake Discussed ideal weight for height  Will follow up in 3 months  GERD Symptoms well managed without breakthrough Will try to get off PPI given info for taper and pepcid sent in  Depression/anxiety Continue medications; overusing benzo, anxiety remains poorly controlled - discussed trial of low dose zoloft  Lifestyle discussed: diet/exerise, sleep hygiene, stress management, hydration  Vitamin D Def Below goal at last visit; hasn't increased dose continue supplementation to maintain goal of 60-100 Defer Vit D level  Fatigue Check labs; try switching from b complex tab to sublingual Restarting testosterone injections Suggested he trial restarting CPAP and continue with exercise/weight loss efforts  Continue diet and meds as discussed. Further disposition pending results of labs. Discussed med's effects and SE's.   Over 30 minutes of exam, counseling, chart review, and critical decision making was performed.   Future Appointments  Date Time  Provider Department Center  10/25/2019  2:30 PM Robinette HainesKuppelweiser, [name] L, RPH-CPP [facility]  11/01/2019 10:30 AM [name], William, MD [facility] None  05/16/2020 10:00 AM [name], William, MD [facility] None    ----------------------------------------------------------------------------------------------------------------------  HPI 33 y.o. male  presents for 3 month follow up on hypertension, cholesterol, glucose management, obesity, GERD and vitamin D deficiency.   He is on descovy PrEP via [facility].   He has depression with elements of anxiety on wellbutrin 300 mg daily and PRN ativan, 1 mg - takes 1/2-1 tab twice daily, has been cutting down, down from three daily. He was on celexa caused ED and retrograde ejaculation.   he has a diagnosis of GERD which is currently managed by omeprazole 20 mg  he reports symptoms is currently well controlled, and denies breakthrough reflux, burning in chest, hoarseness or cough.    BMI is Body mass index is 32.01 kg/m., he has been working on diet and exercise. He has hx of OSA on CPAP but successfully tapered off after 40# weight loss.  Wt Readings from Last 3 Encounters:  07/27/19 263 lb (119.3 kg)  04/19/19 283 lb 6.4 oz (128.5 kg)  10/13/18 283 lb (128.4 kg)   His blood pressure has been controlled at home, today their BP is BP: 120/78  He does workout. He denies chest pain, shortness of breath, dizziness.   He is not on cholesterol medication omega 3 and denies myalgias. His LDL cholesterol is at goal, trigs remain elevated. The cholesterol last visit was:   Lab Results  Component Value Date   CHOL 161 04/19/2019   HDL 24 (L) 04/19/2019   LDLCALC 98 04/19/2019   TRIG 293 (H) 04/19/2019   CHOLHDL 6.7 (H) 04/19/2019  He has been working on diet and exercise for prediabetes/insulin resistance and has been on metformin since 2015, and denies increased appetite, nausea, paresthesia of the feet, polydipsia, polyuria, visual  disturbances, vomiting and weight loss. Last A1C in the office was:  Lab Results  Component Value Date   HGBA1C 4.8 04/19/2019   He has a history of testosterone deficiency and formerly on supplement but stopped due consistent benefit, but is interested in restarting today due to persistent fatigue. He is on zinc 50 mg daily.  Lab Results  Component Value Date   TESTOSTERONE 220 (L) 04/19/2019   Patient is on Vitamin D supplement.   Lab Results  Component Value Date   VD25OH 40 04/19/2019     Patient is on a daily B complex  Lab Results  Component Value Date   VITAMINB12 379 04/19/2019      Current Medications:  Current Outpatient Medications on File Prior to Visit  Medication Sig  . aspirin EC 81 MG tablet Take 81 mg by mouth 2 (two) times daily.  Marland Kitchen. b complex vitamins tablet Take 1 tablet by mouth daily.  Marland Kitchen. buPROPion (WELLBUTRIN XL) 300 MG 24 hr tablet TAKE ONE TABLET BY MOUTH IN THE MORNING FOR MOOD AND ADD  . cetirizine (ZYRTEC) 10 MG tablet Take 1 tablet (10 mg total) by mouth daily. (Patient taking differently: Take 10 mg by mouth as needed. )  . Cholecalciferol (VITAMIN D3) 5000 units CAPS Take 1 capsule by mouth 2 (two) times daily.  . cloNIDine (CATAPRES) 0.1 MG tablet Take 0.1 mg by mouth 3 (three) times daily. Take 1/2 to 1 tablet by mouth three times a day  . cloNIDine (CATAPRES) 0.2 MG tablet Take 1 tablet 3 x /day for BP  . fish oil-omega-3 fatty acids 1000 MG capsule Take 1 g by mouth 2 (two) times daily.  . Flaxseed, Linseed, (FLAX SEED OIL PO) Take 1 tablet by mouth 2 (two) times daily.  Marland Kitchen. gabapentin (NEURONTIN) 100 MG capsule Take 1 Capsule 3 x /Day as needed for Pain  . hydrochlorothiazide (HYDRODIURIL) 25 MG tablet Take 1 tablet daily for BP & Fluid Retention  . hyoscyamine (LEVSIN SL) 0.125 MG SL tablet Dissolve  1 to 2 tablets under tongue3 to 4 x day if needed for Nausea, Vomiting, Cramping, Bloating  or Diarrhea  . lisinopril (ZESTRIL) 20 MG tablet Take 1  tablet 2 x /day for BP  . LORazepam (ATIVAN) 1 MG tablet Take 1/2-1 tablet 2 - 3 x /day ONLY if needed for Anxiety Attack &  limit to 5 days /week to avoid addiction  . Magnesium 400 MG TABS Take 400 mg by mouth daily.  . meloxicam (MOBIC) 15 MG tablet Take 1/2 to 1 tablet Daily with Food for Pain & Inflammation - limit to 5 days /week to avoid Kidney Damage  . metFORMIN (GLUCOPHAGE-XR) 500 MG 24 hr tablet TAKE ONE TO TWO TABLETS BY MOUTH TWICE DAILY FOR DIABETES  . omeprazole (PRILOSEC) 20 MG capsule Take 2 capsules (40 mg total) by mouth at bedtime.  . propranolol (INDERAL) 80 MG tablet Take 1 tablet 3 x /day for BP  . zinc gluconate 50 MG tablet Take 50 mg by mouth daily.   No current facility-administered medications on file prior to visit.      Allergies:  Allergies  Allergen Reactions  . Peanut-Containing Drug Products Anaphylaxis  . Ceclor [Cefaclor] Hives and Swelling  . Sulfa Antibiotics Hives     Medical History:  Past Medical History:  Diagnosis Date  . Anxiety   . Depression   . Hidradenitis suppurativa   . Hyperlipidemia   . Hypertension   . Prediabetes    Family history- Reviewed and unchanged Social history- Reviewed and unchanged   Review of Systems:  Review of Systems  Constitutional: Positive for malaise/fatigue. Negative for weight loss.  HENT: Negative for hearing loss and tinnitus.   Eyes: Negative for blurred vision and double vision.  Respiratory: Negative for cough, shortness of breath and wheezing.   Cardiovascular: Negative for chest pain, palpitations, orthopnea, claudication and leg swelling.  Gastrointestinal: Negative for abdominal pain, blood in stool, constipation, diarrhea, heartburn, melena, nausea and vomiting.  Genitourinary: Negative.   Musculoskeletal: Negative for joint pain and myalgias.  Skin: Negative for rash.  Neurological: Negative for dizziness, tingling, sensory change, weakness and headaches.  Endo/Heme/Allergies: Negative  for polydipsia.  Psychiatric/Behavioral: Negative for depression, substance abuse and suicidal ideas. The patient is nervous/anxious. The patient does not have insomnia.   All other systems reviewed and are negative.     Physical Exam: BP 120/78   Pulse 96   Temp 97.7 F (36.5 C)   Ht 6\' 4"  (1.93 m)   Wt 263 lb (119.3 kg)   SpO2 98%   BMI 32.01 kg/m  Wt Readings from Last 3 Encounters:  07/27/19 263 lb (119.3 kg)  04/19/19 283 lb 6.4 oz (128.5 kg)  10/13/18 283 lb (128.4 kg)   General Appearance: Well nourished, in no apparent distress. Eyes: PERRLA, EOMs, conjunctiva no swelling or erythema Sinuses: No Frontal/maxillary tenderness ENT/Mouth: Ext aud canals clear, TMs without erythema, bulging. No erythema, swelling, or exudate on post pharynx.  Tonsils not swollen or erythematous. Hearing normal.  Neck: Supple, thyroid normal.  Respiratory: Respiratory effort normal, BS equal bilaterally without rales, rhonchi, wheezing or stridor.  Cardio: RRR with no MRGs. Brisk peripheral pulses without edema.  Abdomen: Soft, + BS.  Non tender, no guarding, rebound, hernias, masses. Lymphatics: Non tender without lymphadenopathy.  Musculoskeletal: Full ROM, 5/5 strength, Normal gait Skin: Warm, dry without rashes, lesions, ecchymosis.  Neuro: Cranial nerves intact. No cerebellar symptoms.  Psych: Awake and oriented X 3, normal affect, Insight and Judgment appropriate.    Izora Ribas, NP 2:38 PM [facility]

## 2019-07-26 ENCOUNTER — Other Ambulatory Visit: Payer: Self-pay

## 2019-07-26 ENCOUNTER — Ambulatory Visit (INDEPENDENT_AMBULATORY_CARE_PROVIDER_SITE_OTHER): Payer: Managed Care, Other (non HMO) | Admitting: Pharmacist

## 2019-07-26 DIAGNOSIS — Z7252 High risk homosexual behavior: Secondary | ICD-10-CM | POA: Diagnosis not present

## 2019-07-26 NOTE — Progress Notes (Signed)
Date:  07/27/2019   HPI: Spencer Love is a 33 y.o. male who presents to the [facility] for 3 month PrEP follow-up.  Insured   [x]    Uninsured  []    Patient Active Problem List   Diagnosis Date Noted  . Insulin resistance   . Testosterone deficiency 04/17/2016  . Obesity (BMI 30.0-34.9) 10/11/2015  . GERD 03/11/2015  . Genital warts 12/05/2014  . Mixed hyperlipidemia 09/28/2014  . Medication management 09/28/2014  . Vitamin D deficiency 09/28/2014  . OSA (obstructive sleep apnea) 06/21/2014  . Depression, controlled   . Essential hypertension 06/09/2013    Patient's Medications  New Prescriptions   No medications on file  Previous Medications   ASPIRIN EC 81 MG TABLET    Take 81 mg by mouth 2 (two) times daily.   B COMPLEX VITAMINS TABLET    Take 1 tablet by mouth daily.   BUPROPION (WELLBUTRIN XL) 300 MG 24 HR TABLET    TAKE ONE TABLET BY MOUTH IN THE MORNING FOR MOOD AND ADD   CETIRIZINE (ZYRTEC) 10 MG TABLET    Take 1 tablet (10 mg total) by mouth daily.   CHOLECALCIFEROL (VITAMIN D3) 5000 UNITS CAPS    Take 1 capsule by mouth 2 (two) times daily.   CLONIDINE (CATAPRES) 0.1 MG TABLET    Take 0.1 mg by mouth 3 (three) times daily. Take 1/2 to 1 tablet by mouth three times a day   CLONIDINE (CATAPRES) 0.2 MG TABLET    Take 1 tablet 3 x /day for BP   FISH OIL-OMEGA-3 FATTY ACIDS 1000 MG CAPSULE    Take 1 g by mouth 2 (two) times daily.   FLAXSEED, LINSEED, (FLAX SEED OIL PO)    Take 1 tablet by mouth 2 (two) times daily.   GABAPENTIN (NEURONTIN) 100 MG CAPSULE    Take 1 Capsule 3 x /Day as needed for Pain   HYDROCHLOROTHIAZIDE (HYDRODIURIL) 25 MG TABLET    Take 1 tablet daily for BP & Fluid Retention   HYOSCYAMINE (LEVSIN SL) 0.125 MG SL TABLET    Dissolve  1 to 2 tablets under tongue3 to 4 x day if needed for Nausea, Vomiting, Cramping, Bloating  or Diarrhea   LISINOPRIL (ZESTRIL) 20 MG TABLET    Take 1 tablet 2 x /day for BP   LORAZEPAM (ATIVAN) 1 MG TABLET     Take 1/2-1 tablet 2 - 3 x /day ONLY if needed for Anxiety Attack &  limit to 5 days /week to avoid addiction   MAGNESIUM 400 MG TABS    Take 400 mg by mouth daily.   MELOXICAM (MOBIC) 15 MG TABLET    Take 1/2 to 1 tablet Daily with Food for Pain & Inflammation - limit to 5 days /week to avoid Kidney Damage   METFORMIN (GLUCOPHAGE-XR) 500 MG 24 HR TABLET    TAKE ONE TO TWO TABLETS BY MOUTH TWICE DAILY FOR DIABETES   OMEPRAZOLE (PRILOSEC) 20 MG CAPSULE    Take 2 capsules (40 mg total) by mouth at bedtime.   PROPRANOLOL (INDERAL) 80 MG TABLET    Take 1 tablet 3 x /day for BP   ZINC GLUCONATE 50 MG TABLET    Take 50 mg by mouth daily.  Modified Medications   Modified Medication Previous Medication   EMTRICITABINE-TENOFOVIR AF (DESCOVY) 200-25 MG TABLET emtricitabine-tenofovir AF (DESCOVY) 200-25 MG tablet      Take 1 tablet by mouth daily.    Take 1 tablet by  mouth daily.  Discontinued Medications   No medications on file    Allergies: Allergies  Allergen Reactions  . Peanut-Containing Drug Products Anaphylaxis  . Ceclor [Cefaclor] Hives and Swelling  . Sulfa Antibiotics Hives    Past Medical History: Past Medical History:  Diagnosis Date  . Anxiety   . Depression   . Hidradenitis suppurativa   . Hyperlipidemia   . Hypertension   . Prediabetes     Social History: Social History   Socioeconomic History  . Marital status: Single    Spouse name: Not on file  . Number of children: 0  . Years of education: Not on file  . Highest education level: Not on file  Occupational History  . Occupation: Research scientist (life sciences)meat cutter    Employer: [employer]  Social Needs  . Financial resource strain: Not on file  . Food insecurity    Worry: Not on file    Inability: Not on file  . Transportation needs    Medical: Not on file    Non-medical: Not on file  Tobacco Use  . Smoking status: Current Every Day Smoker    Packs/day: 0.60    Years: 10.00    Pack years: 6.00    Types: Cigarettes  .  Smokeless tobacco: Never Used  Substance and Sexual Activity  . Alcohol use: Yes    Comment: social  . Drug use: Yes    Types: Marijuana  . Sexual activity: Not on file  Lifestyle  . Physical activity    Days per week: Not on file    Minutes per session: Not on file  . Stress: Not on file  Relationships  . Social Musicianconnections    Talks on phone: Not on file    Gets together: Not on file    Attends religious service: Not on file    Active member of club or organization: Not on file    Attends meetings of clubs or organizations: Not on file    Relationship status: Not on file  Other Topics Concern  . Not on file  Social History Narrative   Lives alone.      CHL HIV PREP FLOWSHEET RESULTS 07/27/2019 02/16/2019 11/17/2018 10/20/2018  Insurance Status Insured Insured Insured Insured  How did you hear? - - - primary  Gender at birth Male Male Male Male  Gender identity cis-Male cis-Male cis-Male cis-Male  Risk for HIV Condomless vaginal or anal intercourse;>5 partners in past 6 mos (regardless of condom use) >5 partners in past 6 mos (regardless of condom use);Condomless vaginal or anal intercourse;Hx of STI Condomless vaginal or anal intercourse;>5 partners in past 6 mos (regardless of condom use) Condomless vaginal or anal intercourse  Sex Partners Men only Men only Men only Men only  # sex partners past 3-6 mos 4-6 7-9 7-9 4-6  Sex activity preferences Insertive Insertive Insertive;Oral Insertive;Oral  Condom use No No Yes No  % condom use - - 10 -  Partners genders and ages - - - M 25-29;M 20-24;M 15-19  Treated for STI? No No No Yes  HIV symptoms? N/A N/A Sore throat Sore throat  PrEP Eligibility Substantial risk for HIV Substantial risk for HIV HIV negative;CrCl >60 ml/min;Substantial risk for HIV HIV negative;CrCl >60 ml/min;Substantial risk for HIV    Labs:  SCr: Lab Results  Component Value Date   CREATININE 0.92 04/19/2019   CREATININE 1.00 11/17/2018   CREATININE  0.93 10/13/2018   CREATININE 0.80 06/06/2018   CREATININE 0.89 11/12/2017  HIV Lab Results  Component Value Date   HIV NON-REACTIVE 07/26/2019   HIV NON-REACTIVE 02/16/2019   HIV NON-REACTIVE 11/17/2018   HIV NON-REACTIVE 10/20/2018   HIV NON-REACTIVE 10/13/2018   Hepatitis B Lab Results  Component Value Date   HEPBSAB BORDERLINE (A) 10/20/2018   HEPBSAG NON-REACTIVE 10/20/2018   Hepatitis C Lab Results  Component Value Date   HEPCAB NON-REACTIVE 10/20/2018   Hepatitis A Lab Results  Component Value Date   HAV NON-REACTIVE 10/20/2018   RPR and STI Lab Results  Component Value Date   LABRPR NON-REACTIVE 07/26/2019   LABRPR NON-REACTIVE 10/13/2018   LABRPR NON REAC 04/15/2017   LABRPR NON REAC 03/09/2017   LABRPR NON REAC 02/03/2014    STI Results GC GC CT CT  11/17/2018 Negative - Negative -  11/17/2018 Negative - Negative -  10/20/2018 Negative - Negative -  03/09/2017 - CANCELED - CANCELED  03/09/2017 - CANCELED - -    Assessment: Spencer Love is here for his 3 month PrEP follow-up and lab appointment.  I haven't seen him since February and he tells me today that he was in a committed monogamous relationship for the last couple of months and stopped Descovy right after he saw me in February.  He has recently restarted the Descovy because they are not monogamous anymore and he, as well as his partner, have had several other partners recently.  He tells me that he isn't going to stop again and knows that he needs to remain on it. He isn't having any issues today except for his testosterone levels and needing to be on a testosterone replacement. He sees his PCP tomorrow. Will check all labs today and see him back in 3 months.   Plan: - HIV antibody, RPR, urine cytology today - Descovy x 3 months if HIV negative - F/u with me again 10/27 at 230pm  [name] L. Kuppelweiser, PharmD, BCIDP, AAHIVP, CPP Infectious Diseases Clinical Pharmacist [facility] 07/27/2019, 10:40 AM

## 2019-07-27 ENCOUNTER — Other Ambulatory Visit: Payer: Self-pay

## 2019-07-27 ENCOUNTER — Encounter: Payer: Self-pay | Admitting: Adult Health

## 2019-07-27 ENCOUNTER — Other Ambulatory Visit: Payer: Self-pay | Admitting: Pharmacist

## 2019-07-27 ENCOUNTER — Ambulatory Visit: Payer: 59 | Admitting: Pharmacist

## 2019-07-27 ENCOUNTER — Ambulatory Visit (INDEPENDENT_AMBULATORY_CARE_PROVIDER_SITE_OTHER): Payer: 59 | Admitting: Adult Health

## 2019-07-27 ENCOUNTER — Telehealth: Payer: Self-pay

## 2019-07-27 VITALS — BP 120/78 | HR 96 | Temp 97.7°F | Ht 76.0 in | Wt 263.0 lb

## 2019-07-27 DIAGNOSIS — K219 Gastro-esophageal reflux disease without esophagitis: Secondary | ICD-10-CM

## 2019-07-27 DIAGNOSIS — E349 Endocrine disorder, unspecified: Secondary | ICD-10-CM

## 2019-07-27 DIAGNOSIS — I10 Essential (primary) hypertension: Secondary | ICD-10-CM | POA: Diagnosis not present

## 2019-07-27 DIAGNOSIS — Z7252 High risk homosexual behavior: Secondary | ICD-10-CM

## 2019-07-27 DIAGNOSIS — F329 Major depressive disorder, single episode, unspecified: Secondary | ICD-10-CM

## 2019-07-27 DIAGNOSIS — F32A Depression, unspecified: Secondary | ICD-10-CM

## 2019-07-27 DIAGNOSIS — E559 Vitamin D deficiency, unspecified: Secondary | ICD-10-CM

## 2019-07-27 DIAGNOSIS — Z79899 Other long term (current) drug therapy: Secondary | ICD-10-CM

## 2019-07-27 DIAGNOSIS — G4733 Obstructive sleep apnea (adult) (pediatric): Secondary | ICD-10-CM | POA: Diagnosis not present

## 2019-07-27 DIAGNOSIS — E8881 Metabolic syndrome: Secondary | ICD-10-CM

## 2019-07-27 DIAGNOSIS — E782 Mixed hyperlipidemia: Secondary | ICD-10-CM

## 2019-07-27 LAB — HIV ANTIBODY (ROUTINE TESTING W REFLEX): HIV 1&2 Ab, 4th Generation: NONREACTIVE

## 2019-07-27 LAB — RPR: RPR Ser Ql: NONREACTIVE

## 2019-07-27 MED ORDER — TESTOSTERONE CYPIONATE 200 MG/ML IM SOLN: INTRAMUSCULAR | 1 refills | Status: DC

## 2019-07-27 MED ORDER — SERTRALINE HCL 50 MG PO TABS: 50.0000 mg | ORAL_TABLET | Freq: Every day | ORAL | 1 refills | Status: DC

## 2019-07-27 MED ORDER — FAMOTIDINE 20 MG PO TABS: 40.0000 mg | ORAL_TABLET | Freq: Two times a day (BID) | ORAL | 1 refills | Status: DC | PRN

## 2019-07-27 MED ORDER — DESCOVY 200-25 MG PO TABS: 1.0000 | ORAL_TABLET | Freq: Every day | ORAL | 2 refills | Status: DC

## 2019-07-27 MED ORDER — "BD ECLIPSE NEEDLE 21G X 1"" MISC": 0 refills | Status: DC

## 2019-07-27 NOTE — Patient Instructions (Addendum)
Try an over the counter B12 sublingual instead of b complex tabs   Start famotidine twice daily - slowly taper off of prilosec/omeprazole - every other day, every third day, then every 4th day - decrease every 2 weeks then can stop  Then can do famotidine daily or twice daily or just as needed if doing ok without it  Can always restart omeprazole if symptoms aren't well controlled - can add as needed as well - anything less than taking daily is better if possible      Sertraline tablets What is this medicine? SERTRALINE (SER tra leen) is used to treat depression. It may also be used to treat obsessive compulsive disorder, panic disorder, post-trauma stress, premenstrual dysphoric disorder (PMDD) or social anxiety. This medicine may be used for other purposes; ask your health care provider or pharmacist if you have questions. COMMON BRAND NAME(S): Zoloft What should I tell my health care provider before I take this medicine? They need to know if you have any of these conditions:  bleeding disorders  bipolar disorder or a family history of bipolar disorder  glaucoma  heart disease  high blood pressure  history of irregular heartbeat  history of low levels of calcium, magnesium, or potassium in the blood  if you often drink alcohol  liver disease  receiving electroconvulsive therapy  seizures  suicidal thoughts, plans, or attempt; a previous suicide attempt by you or a family member  take medicines that treat or prevent blood clots  thyroid disease  an unusual or allergic reaction to sertraline, other medicines, foods, dyes, or preservatives  pregnant or trying to get pregnant  breast-feeding How should I use this medicine? Take this medicine by mouth with a glass of water. Follow the directions on the prescription label. You can take it with or without food. Take your medicine at regular intervals. Do not take your medicine more often than directed. Do not  stop taking this medicine suddenly except upon the advice of your doctor. Stopping this medicine too quickly may cause serious side effects or your condition may worsen. A special MedGuide will be given to you by the pharmacist with each prescription and refill. Be sure to read this information carefully each time. Talk to your pediatrician regarding the use of this medicine in children. While this drug may be prescribed for children as young as 7 years for selected conditions, precautions do apply. Overdosage: If you think you have taken too much of this medicine contact a poison control center or emergency room at once. NOTE: This medicine is only for you. Do not share this medicine with others. What if I miss a dose? If you miss a dose, take it as soon as you can. If it is almost time for your next dose, take only that dose. Do not take double or extra doses. What may interact with this medicine? Do not take this medicine with any of the following medications:  cisapride  dronedarone  linezolid  MAOIs like Carbex, Eldepryl, Marplan, Nardil, and Parnate  methylene blue (injected into a vein)  pimozide  thioridazine This medicine may also interact with the following medications:  alcohol  amphetamines  aspirin and aspirin-like medicines  certain medicines for depression, anxiety, or psychotic disturbances  certain medicines for fungal infections like ketoconazole, fluconazole, posaconazole, and itraconazole  certain medicines for irregular heart beat like flecainide, quinidine, propafenone  certain medicines for migraine headaches like almotriptan, eletriptan, frovatriptan, naratriptan, rizatriptan, sumatriptan, zolmitriptan  certain medicines for  sleep  certain medicines for seizures like carbamazepine, valproic acid, phenytoin  certain medicines that treat or prevent blood clots like warfarin, enoxaparin,  dalteparin  cimetidine  digoxin  diuretics  fentanyl  isoniazid  lithium  NSAIDs, medicines for pain and inflammation, like ibuprofen or naproxen  other medicines that prolong the QT interval (cause an abnormal heart rhythm) like dofetilide  rasagiline  safinamide  supplements like St. John's wort, kava kava, valerian  tolbutamide  tramadol  tryptophan This list may not describe all possible interactions. Give your health care provider a list of all the medicines, herbs, non-prescription drugs, or dietary supplements you use. Also tell them if you smoke, drink alcohol, or use illegal drugs. Some items may interact with your medicine. What should I watch for while using this medicine? Tell your doctor if your symptoms do not get better or if they get worse. Visit your doctor or health care professional for regular checks on your progress. Because it may take several weeks to see the full effects of this medicine, it is important to continue your treatment as prescribed by your doctor. Patients and their families should watch out for new or worsening thoughts of suicide or depression. Also watch out for sudden changes in feelings such as feeling anxious, agitated, panicky, irritable, hostile, aggressive, impulsive, severely restless, overly excited and hyperactive, or not being able to sleep. If this happens, especially at the beginning of treatment or after a change in dose, call your health care professional. Bonita QuinYou may get drowsy or dizzy. Do not drive, use machinery, or do anything that needs mental alertness until you know how this medicine affects you. Do not stand or sit up quickly, especially if you are an older patient. This reduces the risk of dizzy or fainting spells. Alcohol may interfere with the effect of this medicine. Avoid alcoholic drinks. Your mouth may get dry. Chewing sugarless gum or sucking hard candy, and drinking plenty of water may help. Contact your doctor if  the problem does not go away or is severe. What side effects may I notice from receiving this medicine? Side effects that you should report to your doctor or health care professional as soon as possible:  allergic reactions like skin rash, itching or hives, swelling of the face, lips, or tongue  anxious  black, tarry stools  changes in vision  confusion  elevated mood, decreased need for sleep, racing thoughts, impulsive behavior  eye pain  fast, irregular heartbeat  feeling faint or lightheaded, falls  feeling agitated, angry, or irritable  hallucination, loss of contact with reality  loss of balance or coordination  loss of memory  painful or prolonged erections  restlessness, pacing, inability to keep still  seizures  stiff muscles  suicidal thoughts or other mood changes  trouble sleeping  unusual bleeding or bruising  unusually weak or tired  vomiting Side effects that usually do not require medical attention (report to your doctor or health care professional if they continue or are bothersome):  change in appetite or weight  change in sex drive or performance  diarrhea  increased sweating  indigestion, nausea  tremors This list may not describe all possible side effects. Call your doctor for medical advice about side effects. You may report side effects to FDA at 1-800-FDA-1088. Where should I keep my medicine? Keep out of the reach of children. Store at room temperature between 15 and 30 degrees C (59 and 86 degrees F). Throw away any unused medicine after the  expiration date. NOTE: This sheet is a summary. It may not cover all possible information. If you have questions about this medicine, talk to your doctor, pharmacist, or health care provider.  2020 Elsevier/Gold Standard (2018-12-07 10:09:27)

## 2019-07-27 NOTE — Progress Notes (Signed)
Patient's HIV antibody is negative.  Will send in 3 more months of Descovy to [facility].

## 2019-07-27 NOTE — Telephone Encounter (Signed)
Request for testosterone needles

## 2019-07-28 ENCOUNTER — Telehealth: Payer: Self-pay

## 2019-07-28 LAB — COMPLETE METABOLIC PANEL WITH GFR
AG Ratio: 2.7 (calc) — ABNORMAL HIGH (ref 1.0–2.5)
ALT: 19 U/L (ref 9–46)
AST: 15 U/L (ref 10–40)
Albumin: 4.9 g/dL (ref 3.6–5.1)
Alkaline phosphatase (APISO): 43 U/L (ref 36–130)
BUN: 13 mg/dL (ref 7–25)
CO2: 27 mmol/L (ref 20–32)
Calcium: 9.6 mg/dL (ref 8.6–10.3)
Chloride: 102 mmol/L (ref 98–110)
Creat: 0.88 mg/dL (ref 0.60–1.35)
GFR, Est African American: 133 mL/min/{1.73_m2} (ref >=60)
GFR, Est Non African American: 114 mL/min/{1.73_m2} (ref >=60)
Globulin: 1.8 g/dL (calc) — ABNORMAL LOW (ref 1.9–3.7)
Glucose, Bld: 88 mg/dL (ref 65–99)
Potassium: 4 mmol/L (ref 3.5–5.3)
Sodium: 138 mmol/L (ref 135–146)
Total Bilirubin: 0.6 mg/dL (ref 0.2–1.2)
Total Protein: 6.7 g/dL (ref 6.1–8.1)

## 2019-07-28 LAB — CBC WITH DIFFERENTIAL/PLATELET
Absolute Monocytes: 533 cells/uL (ref 200–950)
Basophils Absolute: 57 cells/uL (ref 0–200)
Basophils Relative: 0.7 %
Eosinophils Absolute: 139 cells/uL (ref 15–500)
Eosinophils Relative: 1.7 %
HCT: 42.4 % (ref 38.5–50.0)
Hemoglobin: 15.1 g/dL (ref 13.2–17.1)
Lymphs Abs: 1410 cells/uL (ref 850–3900)
MCH: 30.8 pg (ref 27.0–33.0)
MCHC: 35.6 g/dL (ref 32.0–36.0)
MCV: 86.5 fL (ref 80.0–100.0)
MPV: 11.8 fL (ref 7.5–12.5)
Monocytes Relative: 6.5 %
Neutro Abs: 6060 cells/uL (ref 1500–7800)
Neutrophils Relative %: 73.9 %
Platelets: 214 10*3/uL (ref 140–400)
RBC: 4.9 10*6/uL (ref 4.20–5.80)
RDW: 13.3 % (ref 11.0–15.0)
Total Lymphocyte: 17.2 %
WBC: 8.2 10*3/uL (ref 3.8–10.8)

## 2019-07-28 LAB — MAGNESIUM: Magnesium: 2.1 mg/dL (ref 1.5–2.5)

## 2019-07-28 LAB — C. TRACHOMATIS/N. GONORRHOEAE RNA
C. trachomatis RNA, TMA: NOT DETECTED
N. gonorrhoeae RNA, TMA: NOT DETECTED

## 2019-07-28 LAB — LIPID PANEL
Cholesterol: 185 mg/dL (ref <200)
HDL: 25 mg/dL — ABNORMAL LOW (ref >=40)
LDL Cholesterol (Calc): 125 mg/dL (calc) — ABNORMAL HIGH
Non-HDL Cholesterol (Calc): 160 mg/dL (calc) — ABNORMAL HIGH (ref <130)
Total CHOL/HDL Ratio: 7.4 (calc) — ABNORMAL HIGH (ref <5.0)
Triglycerides: 201 mg/dL — ABNORMAL HIGH (ref <150)

## 2019-07-28 LAB — TSH: TSH: 0.39 mIU/L — ABNORMAL LOW (ref 0.40–4.50)

## 2019-07-28 NOTE — Telephone Encounter (Signed)
Patient concerned about lab results regarding his thyroid. States that he has or has had all of the symptoms that were stated in the results that were given. Please advise.

## 2019-08-01 NOTE — Telephone Encounter (Signed)
Patient aware and will wait until November at his next appointment.

## 2019-08-27 ENCOUNTER — Other Ambulatory Visit: Payer: Self-pay | Admitting: Internal Medicine

## 2019-08-27 DIAGNOSIS — F419 Anxiety disorder, unspecified: Secondary | ICD-10-CM

## 2019-09-24 ENCOUNTER — Other Ambulatory Visit: Payer: Self-pay | Admitting: Internal Medicine

## 2019-09-24 DIAGNOSIS — F419 Anxiety disorder, unspecified: Secondary | ICD-10-CM

## 2019-10-25 ENCOUNTER — Ambulatory Visit: Payer: Managed Care, Other (non HMO) | Admitting: Pharmacist

## 2019-10-26 ENCOUNTER — Ambulatory Visit (INDEPENDENT_AMBULATORY_CARE_PROVIDER_SITE_OTHER): Payer: Managed Care, Other (non HMO) | Admitting: Pharmacist

## 2019-10-26 ENCOUNTER — Other Ambulatory Visit: Payer: Self-pay

## 2019-10-26 ENCOUNTER — Other Ambulatory Visit (HOSPITAL_COMMUNITY)
Admission: RE | Admit: 2019-10-26 | Discharge: 2019-10-26 | Disposition: A | Discharge status: Home / Self Care | Payer: Managed Care, Other (non HMO) | Source: Ambulatory Visit | Attending: Infectious Disease | Admitting: Infectious Disease

## 2019-10-26 DIAGNOSIS — Z7252 High risk homosexual behavior: Secondary | ICD-10-CM | POA: Insufficient documentation

## 2019-10-26 DIAGNOSIS — Z114 Encounter for screening for human immunodeficiency virus [HIV]: Secondary | ICD-10-CM | POA: Diagnosis not present

## 2019-10-26 NOTE — Progress Notes (Signed)
Date:  10/26/2019   HPI: Spencer Love is a 33 y.o. male who presents to the [facility] pharmacy clinic for 3 month PrEP follow-up.  Insured   [x]    Uninsured  []    Patient Active Problem List   Diagnosis Date Noted  . Insulin resistance   . Testosterone deficiency 04/17/2016  . Obesity (BMI 30.0-34.9) 10/11/2015  . GERD 03/11/2015  . Genital warts 12/05/2014  . Mixed hyperlipidemia 09/28/2014  . Medication management 09/28/2014  . Vitamin D deficiency 09/28/2014  . OSA (obstructive sleep apnea) 06/21/2014  . Depression, controlled   . Essential hypertension 06/09/2013    Patient's Medications  New Prescriptions   No medications on file  Previous Medications   ASPIRIN EC 81 MG TABLET    Take 81 mg by mouth 2 (two) times daily.   B COMPLEX VITAMINS TABLET    Take 1 tablet by mouth daily.   BUPROPION (WELLBUTRIN XL) 300 MG 24 HR TABLET    TAKE ONE TABLET BY MOUTH IN THE MORNING FOR MOOD AND ADD   CETIRIZINE (ZYRTEC) 10 MG TABLET    Take 1 tablet (10 mg total) by mouth daily.   CHOLECALCIFEROL (VITAMIN D3) 5000 UNITS CAPS    Take 1 capsule by mouth 2 (two) times daily.   CLONIDINE (CATAPRES) 0.2 MG TABLET    Take 1 tablet 3 x /day for BP   EMTRICITABINE-TENOFOVIR AF (DESCOVY) 200-25 MG TABLET    Take 1 tablet by mouth daily.   FAMOTIDINE (PEPCID) 20 MG TABLET    Take 2 tablets (40 mg total) by mouth 2 (two) times daily as needed for heartburn or indigestion.   FISH OIL-OMEGA-3 FATTY ACIDS 1000 MG CAPSULE    Take 1 g by mouth 2 (two) times daily.   FLAXSEED, LINSEED, (FLAX SEED OIL PO)    Take 1 tablet by mouth 2 (two) times daily.   GABAPENTIN (NEURONTIN) 100 MG CAPSULE    Take 1 Capsule 3 x /Day as needed for Pain   HYDROCHLOROTHIAZIDE (HYDRODIURIL) 25 MG TABLET    Take 1 tablet daily for BP & Fluid Retention   HYOSCYAMINE (LEVSIN SL) 0.125 MG SL TABLET    Dissolve  1 to 2 tablets under tongue3 to 4 x day if needed for Nausea, Vomiting, Cramping, Bloating  or Diarrhea   LISINOPRIL  (ZESTRIL) 20 MG TABLET    Take 1 tablet 2 x /day for BP   LORAZEPAM (ATIVAN) 1 MG TABLET    take  1/2 to 1  tablet  2 to 3 x /day only if needed for anxiety attack  & limit to 5 days per week to avoid addiction   MAGNESIUM 400 MG TABS    Take 400 mg by mouth daily.   MELOXICAM (MOBIC) 15 MG TABLET    Take 1/2 to 1 tablet Daily with Food for Pain & Inflammation - limit to 5 days /week to avoid Kidney Damage   METFORMIN (GLUCOPHAGE-XR) 500 MG 24 HR TABLET    TAKE ONE TO TWO TABLETS BY MOUTH TWICE DAILY FOR DIABETES   NEEDLE, DISP, 21 G (BD ECLIPSE NEEDLE) 21G X 1" MISC    Use to inject testosterone into the muscle every two weeks.   OMEPRAZOLE (PRILOSEC) 20 MG CAPSULE    Take 2 capsules (40 mg total) by mouth at bedtime.   PROPRANOLOL (INDERAL) 80 MG TABLET    Take 1 tablet 3 x /day for BP   SERTRALINE (ZOLOFT) 50 MG TABLET  Take 1 tablet (50 mg total) by mouth daily.   TESTOSTERONE CYPIONATE (DEPOTESTOSTERONE CYPIONATE) 200 MG/ML INJECTION    Inject 2 ml IM every 2 weeks   ZINC GLUCONATE 50 MG TABLET    Take 50 mg by mouth daily.  Modified Medications   No medications on file  Discontinued Medications   No medications on file    Allergies: Allergies  Allergen Reactions  . Peanut-Containing Drug Products Anaphylaxis  . Ceclor [Cefaclor] Hives and Swelling  . Sulfa Antibiotics Hives    Past Medical History: Past Medical History:  Diagnosis Date  . Anxiety   . Depression   . Hidradenitis suppurativa   . Hyperlipidemia   . Hypertension   . Prediabetes     Social History: Social History   Socioeconomic History  . Marital status: Single    Spouse name: Not on file  . Number of children: 0  . Years of education: Not on file  . Highest education level: Not on file  Occupational History  . Occupation: Biomedical engineer: [employer]  Social Needs  . Financial resource strain: Not on file  . Food insecurity    Worry: Not on file    Inability: Not on file  .  Transportation needs    Medical: Not on file    Non-medical: Not on file  Tobacco Use  . Smoking status: Current Every Day Smoker    Packs/day: 0.60    Years: 10.00    Pack years: 6.00    Types: Cigarettes  . Smokeless tobacco: Never Used  Substance and Sexual Activity  . Alcohol use: Yes    Comment: social  . Drug use: Yes    Types: Marijuana  . Sexual activity: Not on file  Lifestyle  . Physical activity    Days per week: Not on file    Minutes per session: Not on file  . Stress: Not on file  Relationships  . Social Herbalist on phone: Not on file    Gets together: Not on file    Attends religious service: Not on file    Active member of club or organization: Not on file    Attends meetings of clubs or organizations: Not on file    Relationship status: Not on file  Other Topics Concern  . Not on file  Social History Narrative   Lives alone.      [facility] HIV PREP FLOWSHEET RESULTS 10/26/2019 10/26/2019 07/27/2019 02/16/2019 11/17/2018 10/20/2018  Insurance Status Insured Insured Insured Insured Insured Insured  How did you hear? - - - - - primary  Gender at birth Male Male Male Male Male Male  Gender identity cis-Male cis-Male cis-Male cis-Male cis-Male cis-Male  Risk for HIV Condomless vaginal or anal intercourse;>5 partners in past 6 mos (regardless of condom use) Condomless vaginal or anal intercourse;>5 partners in past 6 mos (regardless of condom use) Condomless vaginal or anal intercourse;>5 partners in past 6 mos (regardless of condom use) >5 partners in past 6 mos (regardless of condom use);Condomless vaginal or anal intercourse;Hx of STI Condomless vaginal or anal intercourse;>5 partners in past 6 mos (regardless of condom use) Condomless vaginal or anal intercourse  Sex Partners Men only Men only Men only Men only Men only Men only  # sex partners past 3-6 mos 10-12 10-12 4-6 7-9 7-9 4-6  Sex activity preferences Insertive and receptive Insertive Insertive  Insertive Insertive;Oral Insertive;Oral  Condom use No No No No Yes No  %  condom use - - - - 7810 -  Partners genders and ages - - - - - M 25-29;M 20-24;M 15-19  Treated for STI? No No No No No Yes  HIV symptoms? - - N/A N/A Sore throat Sore throat  PrEP Eligibility Substantial risk for HIV Substantial risk for HIV Substantial risk for HIV Substantial risk for HIV HIV negative;CrCl >60 ml/min;Substantial risk for HIV HIV negative;CrCl >60 ml/min;Substantial risk for HIV    Labs:  SCr: Lab Results  Component Value Date   CREATININE 0.88 07/27/2019   CREATININE 0.92 04/19/2019   CREATININE 1.00 11/17/2018   CREATININE 0.93 10/13/2018   CREATININE 0.80 06/06/2018   HIV Lab Results  Component Value Date   HIV NON-REACTIVE 07/26/2019   HIV NON-REACTIVE 02/16/2019   HIV NON-REACTIVE 11/17/2018   HIV NON-REACTIVE 10/20/2018   HIV NON-REACTIVE 10/13/2018   Hepatitis B Lab Results  Component Value Date   HEPBSAB BORDERLINE (A) 10/20/2018   HEPBSAG NON-REACTIVE 10/20/2018   Hepatitis C Lab Results  Component Value Date   HEPCAB NON-REACTIVE 10/20/2018   Hepatitis A Lab Results  Component Value Date   HAV NON-REACTIVE 10/20/2018   RPR and STI Lab Results  Component Value Date   LABRPR NON-REACTIVE 07/26/2019   LABRPR NON-REACTIVE 10/13/2018   LABRPR NON REAC 04/15/2017   LABRPR NON REAC 03/09/2017   LABRPR NON REAC 02/03/2014    STI Results GC GC CT CT  11/17/2018 Negative - Negative -  11/17/2018 Negative - Negative -  10/20/2018 Negative - Negative -  03/09/2017 - CANCELED - CANCELED  03/09/2017 - CANCELED - -    Assessment: Celene SkeenAlex Love is here today for his 334-month PrEP follow-up appointment. He has been taking Descovy with no missed doses. Today he has complaints of fatigue and minor weight loss, and he is scheduled to see his primary care provider next Tuesday for evaluation of thyroid abnormalities. He has not had any unexplained illnesses, night sweats, fevers,  or any other signs of potential HIV infection. He expressed no signs or symptoms of STIs, but would like to be swabbed orally and rectally for STIs due to his change from strictly insertive partner to both insertive and receptive partner. Since his last visit, he has had multiple new partners up to 10 total at this point with no condom use.   Today we will check for STIs and HIV. [name] will send in three more months of Descovy if he is HIV negative. He will follow up with [name] in three months on 1/26.   Plan: - HIV antibody, RPR, rectal and oral swab for cytology, urine cytology (if able to urinate) - If HIV negative, will send three more months of Descovy - Will follow up with [name] in three months on 1/26  Ellison CarwinEmily Hoyle Barkdull, PharmD PGY1 Pharmacy Resident [facility] 10/26/2019, 10:25 AM

## 2019-10-27 ENCOUNTER — Other Ambulatory Visit: Payer: Self-pay | Admitting: Pharmacist

## 2019-10-27 ENCOUNTER — Telehealth: Payer: Self-pay

## 2019-10-27 DIAGNOSIS — Z7252 High risk homosexual behavior: Secondary | ICD-10-CM

## 2019-10-27 LAB — RPR: RPR Ser Ql: NONREACTIVE

## 2019-10-27 LAB — HIV ANTIBODY (ROUTINE TESTING W REFLEX): HIV 1&2 Ab, 4th Generation: NONREACTIVE

## 2019-10-27 MED ORDER — DESCOVY 200-25 MG PO TABS: 1.0000 | ORAL_TABLET | Freq: Every day | ORAL | 2 refills | Status: DC

## 2019-10-27 NOTE — Telephone Encounter (Signed)
Spoke with Spencer Love about his negative HIV and RPR tests. He forgot to mention at his visit yesterday that a recent partner tested positive for gonorrhea. He had insertive sex with this partner and was unable to urinate for testing yesterday. He has an appointment with his PCP on Tuesday of next week. We advised him to seek treatment for gonorrhea at this visit. [name] will send in a 3 month supply of Descovy for PrEP.

## 2019-10-27 NOTE — Progress Notes (Signed)
Patient's HIV antibody is negative.  Will send in 3 more months of Descovy to [facility].

## 2019-10-27 NOTE — Progress Notes (Signed)
I saw the patient with our pharmacy resident, Raquel Sarna, and agree with her assessment and plan as outlined in the note below.

## 2019-10-27 NOTE — Telephone Encounter (Signed)
[facility] Patient Advocate Encounter   Received notification from [place] that prior authorization for Descovy is required.   PA submitted on 10/27/2019 Key [id number] Status is approved    [place] [phone]. The script has been sent over to the [facility] from the [facility] location and will be sent to the patient.   Bartholomew Crews, CPhT Specialty Pharmacy Patient [facility] Phone: [phone] Fax: [phone] 10/27/2019 4:51 PM

## 2019-10-28 LAB — CYTOLOGY, (ORAL, ANAL, URETHRAL) ANCILLARY ONLY
Chlamydia: NEGATIVE
Chlamydia: NEGATIVE
Comment: NEGATIVE
Comment: NEGATIVE
Comment: NORMAL
Comment: NORMAL
Neisseria Gonorrhea: NEGATIVE
Neisseria Gonorrhea: NEGATIVE

## 2019-10-31 ENCOUNTER — Encounter: Payer: Self-pay | Admitting: Internal Medicine

## 2019-10-31 NOTE — Progress Notes (Signed)
History of Present Illness:      This very nice 33 y.o. single WM presents for 6 month follow up with HTN, HLD, Pre-Diabetes and Vitamin D Deficiency.       Today he has additional c/o of urinary frequency & smaller volumes - denies any dysuria, discharge or change in color of urine.         Patient is also followed in the [facility] for High Risk Homosexual behavior and is on HIV suppressive meds.       Patient is treated for HTN (2014) & BP has been controlled at home. Today's BP is at goal -138/84. Patient has had no complaints of any cardiac type chest pain, palpitations, dyspnea / orthopnea / PND, dizziness, claudication, or dependent edema.  Hyperlipidemia is controlled not with diet & meds. Patient denies myalgias or other med SE's. Last Lipids were not at goal:  Lab Results  Component Value Date   CHOL 185 07/27/2019   HDL 25 (L) 07/27/2019   LDLCALC 125 (H) 07/27/2019   TRIG 201 (H) 07/27/2019   CHOLHDL 7.4 (H) 07/27/2019        Also, the patient has Moderate Obesity (BMI 32+) and history of PreDiabetes/Insulin Resistance ( 5.3% / elevated Insulin 152 / 2015)  For which he was initiated on Metformin for Insulin resostanceand has had no symptoms of reactive hypoglycemia, diabetic polys, paresthesias or visual blurring.  Last A1c was Normal & at goal: Lab Results  Component Value Date   HGBA1C 4.8 04/19/2019       Further, the patient also has history of Vitamin D Deficiency ("23" / 2015 & "24" / 2016) and supplements vitamin D without any suspected side-effects. Last vitamin D was still low:  Lab Results  Component Value Date   VD25OH 40 04/19/2019    Current Outpatient Medications on File Prior to Visit  Medication Sig  . aspirin EC 81 MG tablet Take 81 mg by mouth 2 (two) times daily.  Marland Kitchen b complex vitamins tablet Take 1 tablet by mouth daily.  Marland Kitchen buPROPion (WELLBUTRIN XL) 300 MG 24 hr tablet TAKE ONE TABLET BY MOUTH IN THE MORNING FOR MOOD AND ADD   . cetirizine (ZYRTEC) 10 MG tablet Take 1 tablet (10 mg total) by mouth daily. (Patient taking differently: Take 10 mg by mouth as needed. )  . cloNIDine (CATAPRES) 0.2 MG tablet Take 1 tablet 3 x /day for BP  . emtricitabine-tenofovir AF (DESCOVY) 200-25 MG tablet Take 1 tablet by mouth daily.  . famotidine (PEPCID) 20 MG tablet Take 2 tablets (40 mg total) by mouth 2 (two) times daily as needed for heartburn or indigestion.  . fish oil-omega-3 fatty acids 1000 MG capsule Take 1 g by mouth 2 (two) times daily.  . Flaxseed, Linseed, (FLAX SEED OIL PO) Take 1 tablet by mouth 2 (two) times daily.  Marland Kitchen gabapentin (NEURONTIN) 100 MG capsule Take 1 Capsule 3 x /Day as needed for Pain  . hydrochlorothiazide (HYDRODIURIL) 25 MG tablet Take 1 tablet daily for BP & Fluid Retention  . hyoscyamine (LEVSIN SL) 0.125 MG SL tablet Dissolve  1 to 2 tablets under tongue3 to 4 x day if needed for Nausea, Vomiting, Cramping, Bloating  or Diarrhea  . lisinopril (ZESTRIL) 20 MG tablet Take 1 tablet 2 x /day for BP  . LORazepam (ATIVAN) 1 MG tablet take  1/2 to 1  tablet  2 to 3 x /day only if needed for  anxiety attack  & limit to 5 days per week to avoid addiction  . Magnesium 400 MG TABS Take 400 mg by mouth daily.  . meloxicam (MOBIC) 15 MG tablet Take 1/2 to 1 tablet Daily with Food for Pain & Inflammation - limit to 5 days /week to avoid Kidney Damage  . metFORMIN (GLUCOPHAGE-XR) 500 MG 24 hr tablet TAKE ONE TO TWO TABLETS BY MOUTH TWICE DAILY FOR DIABETES  . NEEDLE, DISP, 21 G (BD ECLIPSE NEEDLE) 21G X 1" MISC Use to inject testosterone into the muscle every two weeks.  . propranolol (INDERAL) 80 MG tablet Take 1 tablet 3 x /day for BP  . sertraline (ZOLOFT) 50 MG tablet Take 1 tablet (50 mg total) by mouth daily.  Marland Kitchen. testosterone cypionate (DEPOTESTOSTERONE CYPIONATE) 200 MG/ML injection Inject 2 ml IM every 2 weeks  . VITAMIN D PO Take 10,000 Units by mouth daily.  Marland Kitchen. zinc gluconate 50 MG tablet Take 50 mg by  mouth daily.   No current facility-administered medications on file prior to visit.    Allergies  Allergen Reactions  . Peanut-Containing Drug Products Anaphylaxis  . Ceclor [Cefaclor] Hives and Swelling  . Sulfa Antibiotics Hives   PMHx:   Past Medical History:  Diagnosis Date  . Anxiety   . Depression   . Hidradenitis suppurativa   . Hyperlipidemia   . Hypertension   . Prediabetes    Immunization History  Administered Date(s) Administered  . Hepatitis A, Adult 02/16/2019  . Hepatitis B, adult 02/16/2019  . Influenza Inj Mdck Quad With Preservative 11/01/2019  . Influenza Split 10/11/2015  . Influenza,inj,Quad PF,6+ Mos 10/13/2014  . PPD Test 10/11/2015, 04/19/2019  . Tdap 10/11/2015   Past Surgical History:  Procedure Laterality Date  . None     FHx:    Reviewed / unchanged  SHx:    Reviewed / unchanged   Systems Review:  Constitutional: Denies fever, chills, wt changes, headaches, insomnia, fatigue, night sweats, change in appetite. Eyes: Denies redness, blurred vision, diplopia, discharge, itchy, watery eyes.  ENT: Denies discharge, congestion, post nasal drip, epistaxis, sore throat, earache, hearing loss, dental pain, tinnitus, vertigo, sinus pain, snoring.  CV: Denies chest pain, palpitations, irregular heartbeat, syncope, dyspnea, diaphoresis, orthopnea, PND, claudication or edema. Respiratory: denies cough, dyspnea, DOE, pleurisy, hoarseness, laryngitis, wheezing.  Gastrointestinal: Denies dysphagia, odynophagia, heartburn, reflux, water brash, abdominal pain or cramps, nausea, vomiting, bloating, diarrhea, constipation, hematemesis, melena, hematochezia  or hemorrhoids. Genitourinary: Denies dysuria, frequency, urgency, nocturia, hesitancy, discharge, hematuria or flank pain. Musculoskeletal: Denies arthralgias, myalgias, stiffness, jt. swelling, pain, limping or strain/sprain.  Skin: Denies pruritus, rash, hives, warts, acne, eczema or change in skin  lesion(s). Neuro: No weakness, tremor, incoordination, spasms, paresthesia or pain. Psychiatric: Denies confusion, memory loss or sensory loss. Endo: Denies change in weight, skin or hair change.  Heme/Lymph: No excessive bleeding, bruising or enlarged lymph nodes.  Physical Exam  BP 138/84   Pulse 64   Temp (!) 97.2 F (36.2 C)   Resp 16   Ht 6\' 4"  (1.93 m)   Wt 267 lb 4.8 oz (121.2 kg)   BMI 32.54 kg/m   Appears  well nourished, well groomed  and in no distress.  Eyes: PERRLA, EOMs, conjunctiva no swelling or erythema. Sinuses: No frontal/maxillary tenderness ENT/Mouth: EAC's clear, TM's nl w/o erythema, bulging. Nares clear w/o erythema, swelling, exudates. Oropharynx clear without erythema or exudates. Oral hygiene is good. Tongue normal, non obstructing. Hearing intact.  Neck: Supple. Thyroid not palpable.  Car 2+/2+ without bruits, nodes or JVD. Chest: Respirations nl with BS clear & equal w/o rales, rhonchi, wheezing or stridor.  Cor: Heart sounds normal w/ regular rate and rhythm without sig. murmurs, gallops, clicks or rubs. Peripheral pulses normal and equal  without edema.  Abdomen: Soft & bowel sounds normal. Non-tender w/o guarding, rebound, hernias, masses or organomegaly.  Lymphatics: Unremarkable.  Musculoskeletal: Full ROM all peripheral extremities, joint stability, 5/5 strength and normal gait.  Skin: Warm, dry without exposed rashes, lesions or ecchymosis apparent.  Neuro: Cranial nerves intact, reflexes equal bilaterally. Sensory-motor testing grossly intact. Tendon reflexes grossly intact.  Pysch: Alert & oriented x 3.  Insight and judgement nl & appropriate. No ideations.  Assessment and Plan:  1. Essential hypertension  - Continue medication, monitor blood pressure at home.  - Continue DASH diet.  Reminder to go to the ER if any CP,  SOB, nausea, dizziness, severe HA, changes vision/speech.  - CBC with Differential/Platelet - COMPLETE METABOLIC PANEL  WITH GFR - Magnesium - TSH  2. Hyperlipidemia, mixed  - Continue diet/meds, exercise,& lifestyle modifications.  - Continue monitor periodic cholesterol/liver & renal functions   - Lipid panel - TSH  3. Abnormal glucose  - Continue diet, exercise  - Lifestyle modifications.  - Monitor appropriate labs.  - Hemoglobin A1c - Insulin, random  4. Vitamin D deficiency  - Continue supplementation.  - VITAMIN D 25 Hydroxyl  5. Insulin resistance  - Hemoglobin A1c - Insulin, random  6. Prediabetes  - Hemoglobin A1c - Insulin, random  7. Testosterone deficiency  - Testosterone  8. Medication management  - CBC with Differential/Platelet - COMPLETE METABOLIC PANEL WITH GFR - Magnesium - Lipid panel - TSH - Hemoglobin A1c - Insulin, random - VITAMIN D 25 Hydroxyl - Testosterone  9. Need for immunization against influenza  - FLU VACCINE MDCK QUAD W/Preservative  10. Lower urinary tract symptoms (LUTS)  - tamsulosin 0.4 MG; Take 1 tablet at Bedtime for Bladder Emptying  Disp: 90 caps; Rf: 3       Discussed  regular exercise, BP monitoring, weight control to achieve/maintain BMI less than 25 and discussed med and SE's. Recommended labs to assess and monitor clinical status with further disposition pending results of labs.  I discussed the assessment and treatment plan with the patient. The patient was provided an opportunity to ask questions and all were answered. The patient agreed with the plan and demonstrated an understanding of the instructions.  I provided over 30 minutes of exam, counseling, chart review and  complex critical decision making.  Kirtland Bouchard, MD

## 2019-10-31 NOTE — Patient Instructions (Signed)

## 2019-11-01 ENCOUNTER — Ambulatory Visit: Payer: 59 | Admitting: Internal Medicine

## 2019-11-01 ENCOUNTER — Other Ambulatory Visit: Payer: Self-pay

## 2019-11-01 ENCOUNTER — Encounter: Payer: Self-pay | Admitting: Internal Medicine

## 2019-11-01 VITALS — BP 138/84 | HR 64 | Temp 97.2°F | Resp 16 | Ht 76.0 in | Wt 267.3 lb

## 2019-11-01 DIAGNOSIS — R399 Unspecified symptoms and signs involving the genitourinary system: Secondary | ICD-10-CM

## 2019-11-01 DIAGNOSIS — R7303 Prediabetes: Secondary | ICD-10-CM

## 2019-11-01 DIAGNOSIS — I10 Essential (primary) hypertension: Secondary | ICD-10-CM

## 2019-11-01 DIAGNOSIS — E349 Endocrine disorder, unspecified: Secondary | ICD-10-CM

## 2019-11-01 DIAGNOSIS — E559 Vitamin D deficiency, unspecified: Secondary | ICD-10-CM | POA: Diagnosis not present

## 2019-11-01 DIAGNOSIS — Z79899 Other long term (current) drug therapy: Secondary | ICD-10-CM

## 2019-11-01 DIAGNOSIS — R7309 Other abnormal glucose: Secondary | ICD-10-CM | POA: Diagnosis not present

## 2019-11-01 DIAGNOSIS — Z23 Encounter for immunization: Secondary | ICD-10-CM

## 2019-11-01 DIAGNOSIS — E8881 Metabolic syndrome: Secondary | ICD-10-CM

## 2019-11-01 DIAGNOSIS — E782 Mixed hyperlipidemia: Secondary | ICD-10-CM

## 2019-11-01 MED ORDER — TAMSULOSIN HCL 0.4 MG PO CAPS: ORAL_CAPSULE | ORAL | 3 refills | Status: DC

## 2019-11-02 ENCOUNTER — Other Ambulatory Visit: Payer: Self-pay | Admitting: Internal Medicine

## 2019-11-02 DIAGNOSIS — E782 Mixed hyperlipidemia: Secondary | ICD-10-CM

## 2019-11-02 LAB — CBC WITH DIFFERENTIAL/PLATELET
Absolute Monocytes: 382 cells/uL (ref 200–950)
Basophils Absolute: 47 cells/uL (ref 0–200)
Basophils Relative: 0.7 %
Eosinophils Absolute: 147 cells/uL (ref 15–500)
Eosinophils Relative: 2.2 %
HCT: 44.1 % (ref 38.5–50.0)
Hemoglobin: 15.4 g/dL (ref 13.2–17.1)
Lymphs Abs: 1796 cells/uL (ref 850–3900)
MCH: 31.1 pg (ref 27.0–33.0)
MCHC: 34.9 g/dL (ref 32.0–36.0)
MCV: 89.1 fL (ref 80.0–100.0)
MPV: 11.3 fL (ref 7.5–12.5)
Monocytes Relative: 5.7 %
Neutro Abs: 4328 cells/uL (ref 1500–7800)
Neutrophils Relative %: 64.6 %
Platelets: 182 10*3/uL (ref 140–400)
RBC: 4.95 10*6/uL (ref 4.20–5.80)
RDW: 13.3 % (ref 11.0–15.0)
Total Lymphocyte: 26.8 %
WBC: 6.7 10*3/uL (ref 3.8–10.8)

## 2019-11-02 LAB — COMPLETE METABOLIC PANEL WITH GFR
AG Ratio: 2.4 (calc) (ref 1.0–2.5)
ALT: 23 U/L (ref 9–46)
AST: 17 U/L (ref 10–40)
Albumin: 4.8 g/dL (ref 3.6–5.1)
Alkaline phosphatase (APISO): 46 U/L (ref 36–130)
BUN: 18 mg/dL (ref 7–25)
CO2: 29 mmol/L (ref 20–32)
Calcium: 9.7 mg/dL (ref 8.6–10.3)
Chloride: 101 mmol/L (ref 98–110)
Creat: 1.08 mg/dL (ref 0.60–1.35)
GFR, Est African American: 105 mL/min/{1.73_m2} (ref >=60)
GFR, Est Non African American: 91 mL/min/{1.73_m2} (ref >=60)
Globulin: 2 g/dL (calc) (ref 1.9–3.7)
Glucose, Bld: 80 mg/dL (ref 65–99)
Potassium: 4.3 mmol/L (ref 3.5–5.3)
Sodium: 138 mmol/L (ref 135–146)
Total Bilirubin: 0.6 mg/dL (ref 0.2–1.2)
Total Protein: 6.8 g/dL (ref 6.1–8.1)

## 2019-11-02 LAB — LIPID PANEL
Cholesterol: 225 mg/dL — ABNORMAL HIGH (ref <200)
HDL: 27 mg/dL — ABNORMAL LOW (ref >=40)
LDL Cholesterol (Calc): 156 mg/dL (calc) — ABNORMAL HIGH
Non-HDL Cholesterol (Calc): 198 mg/dL (calc) — ABNORMAL HIGH (ref <130)
Total CHOL/HDL Ratio: 8.3 (calc) — ABNORMAL HIGH (ref <5.0)
Triglycerides: 259 mg/dL — ABNORMAL HIGH (ref <150)

## 2019-11-02 LAB — TESTOSTERONE: Testosterone: 160 ng/dL — ABNORMAL LOW (ref 250–827)

## 2019-11-02 LAB — MAGNESIUM: Magnesium: 2.2 mg/dL (ref 1.5–2.5)

## 2019-11-02 LAB — HEMOGLOBIN A1C
Hgb A1c MFr Bld: 5 % of total Hgb (ref <5.7)
Mean Plasma Glucose: 97 (calc)
eAG (mmol/L): 5.4 (calc)

## 2019-11-02 LAB — INSULIN, RANDOM: Insulin: 13.7 u[IU]/mL

## 2019-11-02 LAB — TSH: TSH: 1.27 mIU/L (ref 0.40–4.50)

## 2019-11-02 LAB — VITAMIN D 25 HYDROXY (VIT D DEFICIENCY, FRACTURES): Vit D, 25-Hydroxy: 98 ng/mL (ref 30–100)

## 2019-11-02 MED ORDER — ROSUVASTATIN CALCIUM 20 MG PO TABS: ORAL_TABLET | ORAL | 1 refills | Status: DC

## 2019-11-04 ENCOUNTER — Other Ambulatory Visit: Payer: Self-pay | Admitting: Internal Medicine

## 2019-11-04 ENCOUNTER — Other Ambulatory Visit: Payer: Self-pay | Admitting: Adult Health

## 2019-11-04 DIAGNOSIS — F419 Anxiety disorder, unspecified: Secondary | ICD-10-CM

## 2019-12-20 ENCOUNTER — Other Ambulatory Visit: Payer: Self-pay | Admitting: Internal Medicine

## 2019-12-25 ENCOUNTER — Other Ambulatory Visit: Payer: Self-pay | Admitting: Internal Medicine

## 2020-01-03 ENCOUNTER — Other Ambulatory Visit: Payer: Self-pay | Admitting: Adult Health

## 2020-01-03 ENCOUNTER — Other Ambulatory Visit: Payer: Self-pay | Admitting: Internal Medicine

## 2020-01-03 DIAGNOSIS — F329 Major depressive disorder, single episode, unspecified: Secondary | ICD-10-CM

## 2020-01-03 DIAGNOSIS — F419 Anxiety disorder, unspecified: Secondary | ICD-10-CM

## 2020-01-03 DIAGNOSIS — F32A Depression, unspecified: Secondary | ICD-10-CM

## 2020-01-03 DIAGNOSIS — E349 Endocrine disorder, unspecified: Secondary | ICD-10-CM

## 2020-01-04 ENCOUNTER — Encounter: Payer: Self-pay | Admitting: Adult Health

## 2020-01-04 ENCOUNTER — Other Ambulatory Visit: Payer: Self-pay

## 2020-01-04 ENCOUNTER — Ambulatory Visit (INDEPENDENT_AMBULATORY_CARE_PROVIDER_SITE_OTHER): Payer: Managed Care, Other (non HMO) | Admitting: Adult Health

## 2020-01-04 VITALS — BP 150/80 | HR 96 | Temp 97.7°F | Wt 274.0 lb

## 2020-01-04 DIAGNOSIS — J0301 Acute recurrent streptococcal tonsillitis: Secondary | ICD-10-CM | POA: Diagnosis not present

## 2020-01-04 DIAGNOSIS — J029 Acute pharyngitis, unspecified: Secondary | ICD-10-CM

## 2020-01-04 MED ORDER — PREDNISONE 20 MG PO TABS: ORAL_TABLET | ORAL | 0 refills | Status: DC

## 2020-01-04 MED ORDER — AZITHROMYCIN 250 MG PO TABS: ORAL_TABLET | ORAL | 1 refills | Status: AC

## 2020-01-04 NOTE — Addendum Note (Signed)
Addended by: Dan Maker on: 01/04/2020 05:38 PM   Modules accepted: Orders

## 2020-01-04 NOTE — Progress Notes (Addendum)
Assessment and Plan:  Spencer Love was seen today for sore throat and generalized body aches.  Diagnoses and all orders for this visit:  Acute pharyngitis, tonsillitis, unspecified etiology Recurrent streptococcal tonsillitis Presumptive tx based on hx of recurrent strep, classic presentation Not improving with amoxicillin and recent hx of use; allergy to cefaclor Will give azithromycin Pharyngitis: take medicationss as prescribed, increase fluids,  Salt water gargles. If symptoms do not improve in 5-7 days or get worse contact the office or go to ER. He will get drive through covid 19 testing due to work requirements though with typical strep/tonsillitis presentation suspicion for this is low Refer to ENT for hx of recurrent strep, needs tonsillectomy at some point, requests to proceed with this today  -     azithromycin (ZITHROMAX) 250 MG tablet; Take 2 tablets (500 mg) on  Day 1,  followed by 1 tablet (250 mg) once daily on Days 2 through 5. -     predniSONE (DELTASONE) 20 MG tablet; 2 tablets daily for 3 days, 1 tablet daily for 4 days. -     Ambulatory referral to ENT  Further disposition pending results of labs. Discussed med's effects and SE's.   Over 30 minutes of exam, counseling, chart review, and critical decision making was performed.   Future Appointments  Date Time Provider Department Center  01/24/2020  9:15 AM Kuppelweiser, Cherylann Ratel, RPH-CPP [facility]  02/07/2020  8:45 AM Allen Norris, Bella Kennedy, NP [facility] None  05/16/2020 10:00 AM Lucky Cowboy, MD [facility] None    ------------------------------------------------------------------------------------------------------------------   HPI BP (!) 150/80   Pulse 96   Temp 97.7 F (36.5 C)   Wt 274 lb (124.3 kg)   SpO2 97%   BMI 33.35 kg/m   34 y.o.male, current smoker (0.6 pack) with reported hx of recurrent strep (reports typically neg on rapid but positive on culture at [facility] last in fall 2020, tx with amoxicillin)  presents for 2 day history of sore throat, left ear pain. "feels bad." Denies fever/chills, HA, blurry vision, dizziness, cough, nasal congestion/drainage (other than typical chronic with seasonal allergies), no sinus pressure or tenderness, GI sx, rash, changes in smell or taste.   Resolved with course of amoxicillin last time, had leftover from dental extraction and started taking yesterday, would typically be improved by day 2 but not any better and presents for evaluation.   He states "I need to get my tonsils out."   Lives with roommate but doesn't interact, no known sick exposures but  Works at [employer], has to answer questions prior to going to work, has been out due to sore throat and mild myalgias x 2 days. He does plan to present for covid 19 testing due to their requirements.   Past Medical History:  Diagnosis Date  . Anxiety   . Depression   . Hidradenitis suppurativa   . Hyperlipidemia   . Hypertension   . Prediabetes      Allergies  Allergen Reactions  . Peanut-Containing Drug Products Anaphylaxis  . Ceclor [Cefaclor] Hives and Swelling  . Sulfa Antibiotics Hives    Current Outpatient Medications on File Prior to Visit  Medication Sig  . aspirin EC 81 MG tablet Take 81 mg by mouth 2 (two) times daily.  Marland Kitchen b complex vitamins tablet Take 1 tablet by mouth daily.  Marland Kitchen buPROPion (WELLBUTRIN XL) 300 MG 24 hr tablet Take 1 tablet every Morning for Mood, Focus & Concentration  . cetirizine (ZYRTEC) 10 MG tablet Take 1 tablet (10  mg total) by mouth daily. (Patient taking differently: Take 10 mg by mouth as needed. )  . cloNIDine (CATAPRES) 0.2 MG tablet Take 1 tablet 3 x /day for BP  . emtricitabine-tenofovir AF (DESCOVY) 200-25 MG tablet Take 1 tablet by mouth daily.  . famotidine (KLS ACID CONTROLLER MAX ST) 20 MG tablet Take 1 to 2 tablets 1 to 2 x /day as needed for Indigestion & Heartburn  . fish oil-omega-3 fatty acids 1000 MG capsule Take 1 g by mouth 2 (two) times daily.   . Flaxseed, Linseed, (FLAX SEED OIL PO) Take 1 tablet by mouth 2 (two) times daily.  Marland Kitchen gabapentin (NEURONTIN) 100 MG capsule Take 1 Capsule 3 x /Day as needed for Pain  . hydrochlorothiazide (HYDRODIURIL) 25 MG tablet Take 1 tablet daily for BP & Fluid Retention  . hyoscyamine (LEVSIN SL) 0.125 MG SL tablet Dissolve  1 to 2 tablets under tongue3 to 4 x day if needed for Nausea, Vomiting, Cramping, Bloating  or Diarrhea  . lisinopril (ZESTRIL) 20 MG tablet TAKE ONE TABLET BY MOUTH TWICE A DAY FOR BLOOD PRESSURE  . LORazepam (ATIVAN) 1 MG tablet Take 1/2-1 tablet 2 - 3 x /day ONLY if needed for Panic Attack or Anxiety Attack &  limit to 5 days /week to avoid addiction or Dementia  . Magnesium 400 MG TABS Take 400 mg by mouth daily.  . meloxicam (MOBIC) 15 MG tablet Take 1/2 to 1 tablet Daily with Food for Pain & Inflammation - limit to 5 days /week to avoid Kidney Damage  . metFORMIN (GLUCOPHAGE-XR) 500 MG 24 hr tablet TAKE ONE TO TWO TABLETS BY MOUTH TWICE DAILY FOR DIABETES  . NEEDLE, DISP, 21 G (BD ECLIPSE NEEDLE) 21G X 1" MISC Use to inject testosterone into the muscle every two weeks.  . propranolol (INDERAL) 80 MG tablet Take 1 tablet 3 x /day for BP  . rosuvastatin (CRESTOR) 20 MG tablet Take 1 tablet daily for Cholesterol  . sertraline (ZOLOFT) 50 MG tablet Take 1 tablet (50 mg total) by mouth daily.  . tamsulosin (FLOMAX) 0.4 MG CAPS capsule Take 1 tablet at Bedtime for Bladder Emptying  . testosterone cypionate (DEPOTESTOSTERONE CYPIONATE) 200 MG/ML injection inject 2 mls intramuscularly every two weeks (Patient taking differently: inject 1.5 mLs once a week)  . VITAMIN D PO Take 10,000 Units by mouth daily.  Marland Kitchen zinc gluconate 50 MG tablet Take 50 mg by mouth daily.   No current facility-administered medications on file prior to visit.    ROS: all negative except above.   Physical Exam:  BP (!) 150/80   Pulse 96   Temp 97.7 F (36.5 C)   Wt 274 lb (124.3 kg)   SpO2 97%   BMI  33.35 kg/m   General Appearance: Well nourished, well dressed young male in no apparent distress. Eyes: PERRLA, conjunctiva no swelling or erythema Sinuses: No Frontal/maxillary tenderness ENT/Mouth: Ext aud canals clear, TMs without erythema, bulging. Post pharynx is erythematous, without swelling, or exudate on post pharynx.  Tonsils are erythematous, swollen bil 3+ with white patchy areas without obvious abscess; uvula is midline. No salivary pooling. Vocal quality is normal. Hearing normal.  Neck: Supple, thyroid normal.  Respiratory: Respiratory effort normal, BS equal bilaterally without rales, rhonchi, wheezing or stridor.  Cardio: RRR with no MRGs. Brisk peripheral pulses without edema.  Abdomen: Soft, + BS.  Non tender, no guarding, rebound, hernias, masses. Lymphatics: Some anterior cervical chain tenderness without palpable lymph nodes.  Musculoskeletal:  No obvious deformity, no effusions, normal gait.  Skin: Warm, dry without rashes, lesions, ecchymosis.  Neuro: Normal muscle tone, Sensation intact.  Psych: Awake and oriented X 3, normal affect, Insight and Judgment appropriate.     Izora Ribas, NP 3:13 PM [facility]

## 2020-01-05 ENCOUNTER — Encounter: Payer: Self-pay | Admitting: Adult Health

## 2020-01-24 ENCOUNTER — Ambulatory Visit (INDEPENDENT_AMBULATORY_CARE_PROVIDER_SITE_OTHER): Payer: Managed Care, Other (non HMO) | Admitting: Pharmacist

## 2020-01-24 ENCOUNTER — Other Ambulatory Visit: Payer: Self-pay

## 2020-01-24 DIAGNOSIS — Z7252 High risk homosexual behavior: Secondary | ICD-10-CM

## 2020-01-24 DIAGNOSIS — Z23 Encounter for immunization: Secondary | ICD-10-CM

## 2020-01-24 MED ORDER — HEPATITIS B VAC RECOMB ADJ 20 MCG/0.5ML IM SOSY: 0.5000 mL | PREFILLED_SYRINGE | Freq: Once | INTRAMUSCULAR | 0 refills | Status: DC

## 2020-01-24 NOTE — Progress Notes (Signed)
Date:  01/24/2020   HPI: Spencer Love is a 34 y.o. male who presents to the [facility] pharmacy clinic for 3 month PrEP follow-up.  Insured   [x]    Uninsured  []    Patient Active Problem List   Diagnosis Date Noted  . Insulin resistance   . Testosterone deficiency 04/17/2016  . Obesity (BMI 30.0-34.9) 10/11/2015  . GERD 03/11/2015  . Genital warts 12/05/2014  . Mixed hyperlipidemia 09/28/2014  . Medication management 09/28/2014  . Vitamin D deficiency 09/28/2014  . OSA (obstructive sleep apnea) 06/21/2014  . Depression, controlled   . Essential hypertension 06/09/2013    Patient's Medications  New Prescriptions   No medications on file  Previous Medications   ASPIRIN EC 81 MG TABLET    Take 81 mg by mouth 2 (two) times daily.   B COMPLEX VITAMINS TABLET    Take 1 tablet by mouth daily.   BUPROPION (WELLBUTRIN XL) 300 MG 24 HR TABLET    Take 1 tablet every Morning for Mood, Focus & Concentration   CETIRIZINE (ZYRTEC) 10 MG TABLET    Take 1 tablet (10 mg total) by mouth daily.   CLONIDINE (CATAPRES) 0.2 MG TABLET    Take 1 tablet 3 x /day for BP   EMTRICITABINE-TENOFOVIR AF (DESCOVY) 200-25 MG TABLET    Take 1 tablet by mouth daily.   FAMOTIDINE (KLS ACID CONTROLLER MAX ST) 20 MG TABLET    Take 1 to 2 tablets 1 to 2 x /day as needed for Indigestion & Heartburn   FISH OIL-OMEGA-3 FATTY ACIDS 1000 MG CAPSULE    Take 1 g by mouth 2 (two) times daily.   FLAXSEED, LINSEED, (FLAX SEED OIL PO)    Take 1 tablet by mouth 2 (two) times daily.   GABAPENTIN (NEURONTIN) 100 MG CAPSULE    Take 1 Capsule 3 x /Day as needed for Pain   HYDROCHLOROTHIAZIDE (HYDRODIURIL) 25 MG TABLET    Take 1 tablet daily for BP & Fluid Retention   HYOSCYAMINE (LEVSIN SL) 0.125 MG SL TABLET    Dissolve  1 to 2 tablets under tongue3 to 4 x day if needed for Nausea, Vomiting, Cramping, Bloating  or Diarrhea   LISINOPRIL (ZESTRIL) 20 MG TABLET    TAKE ONE TABLET BY MOUTH TWICE A DAY FOR BLOOD PRESSURE   LORAZEPAM  (ATIVAN) 1 MG TABLET    Take 1/2-1 tablet 2 - 3 x /day ONLY if needed for Panic Attack or Anxiety Attack &  limit to 5 days /week to avoid addiction or Dementia   MAGNESIUM 400 MG TABS    Take 400 mg by mouth daily.   MELOXICAM (MOBIC) 15 MG TABLET    Take 1/2 to 1 tablet Daily with Food for Pain & Inflammation - limit to 5 days /week to avoid Kidney Damage   METFORMIN (GLUCOPHAGE-XR) 500 MG 24 HR TABLET    TAKE ONE TO TWO TABLETS BY MOUTH TWICE DAILY FOR DIABETES   NEEDLE, DISP, 21 G (BD ECLIPSE NEEDLE) 21G X 1" MISC    Use to inject testosterone into the muscle every two weeks.   PREDNISONE (DELTASONE) 20 MG TABLET    2 tablets daily for 3 days, 1 tablet daily for 4 days.   PROPRANOLOL (INDERAL) 80 MG TABLET    Take 1 tablet 3 x /day for BP   ROSUVASTATIN (CRESTOR) 20 MG TABLET    Take 1 tablet daily for Cholesterol   SERTRALINE (ZOLOFT) 50 MG TABLET  Take 1 tablet (50 mg total) by mouth daily.   TAMSULOSIN (FLOMAX) 0.4 MG CAPS CAPSULE    Take 1 tablet at Bedtime for Bladder Emptying   TESTOSTERONE CYPIONATE (DEPOTESTOSTERONE CYPIONATE) 200 MG/ML INJECTION    inject 2 mls intramuscularly every two weeks   VITAMIN D PO    Take 10,000 Units by mouth daily.   ZINC GLUCONATE 50 MG TABLET    Take 50 mg by mouth daily.  Modified Medications   No medications on file  Discontinued Medications   No medications on file    Allergies: Allergies  Allergen Reactions  . Peanut-Containing Drug Products Anaphylaxis  . Ceclor [Cefaclor] Hives and Swelling  . Sulfa Antibiotics Hives    Past Medical History: Past Medical History:  Diagnosis Date  . Anxiety   . Depression   . Hidradenitis suppurativa   . Hyperlipidemia   . Hypertension   . Prediabetes     Social History: Social History   Socioeconomic History  . Marital status: Single    Spouse name: Not on file  . Number of children: 0  . Years of education: Not on file  . Highest education level: Not on file  Occupational History  .  Occupation: Research scientist (life sciences): [employer]  Tobacco Use  . Smoking status: Current Every Day Smoker    Packs/day: 0.60    Years: 10.00    Pack years: 6.00    Types: Cigarettes  . Smokeless tobacco: Never Used  Substance and Sexual Activity  . Alcohol use: Yes    Comment: social  . Drug use: Yes    Types: Marijuana  . Sexual activity: Not on file  Other Topics Concern  . Not on file  Social History Narrative   Lives alone.     Social Determinants of Health   Financial Resource Strain:   . Difficulty of Paying Living Expenses: Not on file  Food Insecurity:   . Worried About Programme researcher, broadcasting/film/video in the Last Year: Not on file  . Ran Out of Food in the Last Year: Not on file  Transportation Needs:   . Lack of Transportation (Medical): Not on file  . Lack of Transportation (Non-Medical): Not on file  Physical Activity:   . Days of Exercise per Week: Not on file  . Minutes of Exercise per Session: Not on file  Stress:   . Feeling of Stress : Not on file  Social Connections:   . Frequency of Communication with Friends and Family: Not on file  . Frequency of Social Gatherings with Friends and Family: Not on file  . Attends Religious Services: Not on file  . Active Member of Clubs or Organizations: Not on file  . Attends Banker Meetings: Not on file  . Marital Status: Not on file    [facility] HIV PREP FLOWSHEET RESULTS 01/24/2020 10/26/2019 10/26/2019 07/27/2019 02/16/2019 11/17/2018 10/20/2018  Insurance Status - Insured Insured Insured Insured Insured Insured  How did you hear? - - - - - - primary  Gender at birth Male Male Male Male Male Male Male  Gender identity cis-Male cis-Male cis-Male cis-Male cis-Male cis-Male cis-Male  Risk for HIV - Condomless vaginal or anal intercourse;>5 partners in past 6 mos (regardless of condom use) Condomless vaginal or anal intercourse;>5 partners in past 6 mos (regardless of condom use) Condomless vaginal or anal intercourse;>5  partners in past 6 mos (regardless of condom use) >5 partners in past 6 mos (regardless of condom  use);Condomless vaginal or anal intercourse;Hx of STI Condomless vaginal or anal intercourse;>5 partners in past 6 mos (regardless of condom use) Condomless vaginal or anal intercourse  Sex Partners Men only Men only Men only Men only Men only Men only Men only  # sex partners past 3-6 mos 1-3 10-12 10-12 4-6 7-9 7-9 4-6  Sex activity preferences Insertive and receptive;Oral Insertive and receptive Insertive Insertive Insertive Insertive;Oral Insertive;Oral  Condom use - No No No No Yes No  % condom use - - - - - 10 -  Partners genders and ages - - - - - - M 25-29;M 20-24;M 15-19  Treated for STI? No No No No No No Yes  HIV symptoms? N/A - - N/A N/A Sore throat Sore throat  PrEP Eligibility Substantial risk for HIV Substantial risk for HIV Substantial risk for HIV Substantial risk for HIV Substantial risk for HIV HIV negative;CrCl >60 ml/min;Substantial risk for HIV HIV negative;CrCl >60 ml/min;Substantial risk for HIV    Labs:  SCr: Lab Results  Component Value Date   CREATININE 1.08 11/01/2019   CREATININE 0.88 07/27/2019   CREATININE 0.92 04/19/2019   CREATININE 1.00 11/17/2018   CREATININE 0.93 10/13/2018   HIV Lab Results  Component Value Date   HIV NON-REACTIVE 10/26/2019   HIV NON-REACTIVE 07/26/2019   HIV NON-REACTIVE 02/16/2019   HIV NON-REACTIVE 11/17/2018   HIV NON-REACTIVE 10/20/2018   Hepatitis B Lab Results  Component Value Date   HEPBSAB BORDERLINE (A) 10/20/2018   HEPBSAG NON-REACTIVE 10/20/2018   Hepatitis C Lab Results  Component Value Date   HEPCAB NON-REACTIVE 10/20/2018   Hepatitis A Lab Results  Component Value Date   HAV NON-REACTIVE 10/20/2018   RPR and STI Lab Results  Component Value Date   LABRPR NON-REACTIVE 10/26/2019   LABRPR NON-REACTIVE 07/26/2019   LABRPR NON-REACTIVE 10/13/2018   LABRPR NON REAC 04/15/2017   LABRPR NON REAC  03/09/2017    STI Results GC GC CT CT  10/26/2019 Negative - Negative -  10/26/2019 Negative - Negative -  11/17/2018 Negative - Negative -  11/17/2018 Negative - Negative -  10/20/2018 Negative - Negative -  03/09/2017 - CANCELED - CANCELED  03/09/2017 - CANCELED - -    Assessment: Mr. Guerrette presents for a follow up appointment for PrEP. Patient is on Descovy and reports no issues missing doses or having any troublesome side effects. At patient's last appointment he had concern for gonorrhea due to a partner testing positive but reports he ended up testing negative at his PCP's office. Patient denied need for STD testing today as he has had no new partners since his last visit. Mr. Ducre received dose 1 of 2 of HepA and HepB in February 2020 and is due for the final dose in both series. Patient stated he did not have a high copay after his first doses and agreed to getting his second dose of both vaccines today. Will follow up with HepA antibody and HepB post antibody titer at his next appointment in 3 months.   Plan: - HIV antibody, BMET - Descovy x3 months if HIV negative - Follow up in clinic with [name] 4/27 at 9:15am  Ladoris Gene, [facility] 01/24/2020, 9:32 AM

## 2020-01-25 ENCOUNTER — Encounter: Payer: Self-pay | Admitting: Pharmacist

## 2020-01-25 ENCOUNTER — Other Ambulatory Visit: Payer: Self-pay | Admitting: Pharmacist

## 2020-01-25 DIAGNOSIS — Z7252 High risk homosexual behavior: Secondary | ICD-10-CM

## 2020-01-25 LAB — BASIC METABOLIC PANEL
BUN: 16 mg/dL (ref 7–25)
CO2: 28 mmol/L (ref 20–32)
Calcium: 9.6 mg/dL (ref 8.6–10.3)
Chloride: 104 mmol/L (ref 98–110)
Creat: 1.09 mg/dL (ref 0.60–1.35)
Glucose, Bld: 93 mg/dL (ref 65–99)
Potassium: 4.4 mmol/L (ref 3.5–5.3)
Sodium: 141 mmol/L (ref 135–146)

## 2020-01-25 LAB — HIV ANTIBODY (ROUTINE TESTING W REFLEX): HIV 1&2 Ab, 4th Generation: NONREACTIVE

## 2020-01-25 MED ORDER — DESCOVY 200-25 MG PO TABS: 1.0000 | ORAL_TABLET | Freq: Every day | ORAL | 2 refills | Status: DC

## 2020-01-25 NOTE — Progress Notes (Signed)
Patient's HIV antibody is negative.  Will send in 3 more months of Descovy to [facility].

## 2020-02-01 ENCOUNTER — Other Ambulatory Visit: Payer: Self-pay | Admitting: Internal Medicine

## 2020-02-01 DIAGNOSIS — I10 Essential (primary) hypertension: Secondary | ICD-10-CM

## 2020-02-01 MED ORDER — AMLODIPINE BESYLATE 10 MG PO TABS: ORAL_TABLET | ORAL | 3 refills | Status: DC

## 2020-02-02 ENCOUNTER — Other Ambulatory Visit: Payer: Self-pay | Admitting: Internal Medicine

## 2020-02-07 ENCOUNTER — Encounter: Payer: Self-pay | Admitting: Internal Medicine

## 2020-02-07 ENCOUNTER — Ambulatory Visit (INDEPENDENT_AMBULATORY_CARE_PROVIDER_SITE_OTHER): Payer: Managed Care, Other (non HMO) | Admitting: Internal Medicine

## 2020-02-07 ENCOUNTER — Ambulatory Visit: Payer: Managed Care, Other (non HMO) | Admitting: Adult Health Nurse Practitioner

## 2020-02-07 ENCOUNTER — Other Ambulatory Visit: Payer: Self-pay

## 2020-02-07 VITALS — BP 132/84 | HR 78 | Temp 98.6°F | Resp 12 | Ht 76.0 in | Wt 278.0 lb

## 2020-02-07 DIAGNOSIS — E559 Vitamin D deficiency, unspecified: Secondary | ICD-10-CM

## 2020-02-07 DIAGNOSIS — R7303 Prediabetes: Secondary | ICD-10-CM

## 2020-02-07 DIAGNOSIS — E782 Mixed hyperlipidemia: Secondary | ICD-10-CM | POA: Diagnosis not present

## 2020-02-07 DIAGNOSIS — E8881 Metabolic syndrome: Secondary | ICD-10-CM

## 2020-02-07 DIAGNOSIS — I10 Essential (primary) hypertension: Secondary | ICD-10-CM | POA: Diagnosis not present

## 2020-02-07 DIAGNOSIS — E349 Endocrine disorder, unspecified: Secondary | ICD-10-CM

## 2020-02-07 DIAGNOSIS — Z79899 Other long term (current) drug therapy: Secondary | ICD-10-CM

## 2020-02-07 DIAGNOSIS — R7309 Other abnormal glucose: Secondary | ICD-10-CM

## 2020-02-07 NOTE — Progress Notes (Signed)
History of Present Illness:      This very nice 34 y.o. single WM presents for 3 month follow up with HTN, HLD, Pre-Diabetes/Insulin Resistance and Vitamin D Deficiency. Patient has OSA but uses very infrequently due to mask intolerance.       Patient is treated for HTN  (2014) & BP has been controlled at home. Today's BP is at goal - 132/84. Patient has had no complaints of any cardiac type chest pain, palpitations, dyspnea / orthopnea / PND, dizziness, claudication, or dependent edema.      Hyperlipidemia is controlled with diet & meds. Patient denies myalgias or other med SE's. Last Lipids were not at goal & patient was started on Rosuvastatin:  Lab Results  Component Value Date   CHOL 225 (H) 11/01/2019   HDL 27 (L) 11/01/2019   LDLCALC 156 (H) 11/01/2019   TRIG 259 (H) 11/01/2019   CHOLHDL 8.3 (H) 11/01/2019                                                               Also, the patient has history of PreDiabetes/Insulin Resistance (5.3%/elevated Insulin 152/2015)   and has had no symptoms of reactive hypoglycemia, diabetic polys, paresthesias or visual blurring.  Last A1c was Normal & at goal:  Lab Results  Component Value Date   HGBA1C 5.0 11/01/2019                                                               Further, the patient also has history of Vitamin D Deficiency  ("23" / 2015 and "24" / 2016) and supplements vitamin D without any suspected side-effects. Last vitamin D was at goal:  Lab Results  Component Value Date   VD25OH 98 11/01/2019    Current Outpatient Medications on File Prior to Visit  Medication Sig  . amLODipine (NORVASC) 10 MG tablet Take 1/2 to 1 tablet Daily for BP  . aspirin EC 81 MG tablet Take 81 mg by mouth 2 (two) times daily.  Marland Kitchen b complex vitamins tablet Take 1 tablet by mouth daily.  Marland Kitchen buPROPion (WELLBUTRIN XL) 300 MG 24 hr tablet Take 1 tablet every Morning for Mood, Focus & Concentration  . cetirizine (ZYRTEC) 10 MG tablet  Take 1 tablet (10 mg total) by mouth daily. (Patient taking differently: Take 10 mg by mouth as needed. )  . cloNIDine (CATAPRES) 0.2 MG tablet Take 1 tablet 3 x /day for BP  . emtricitabine-tenofovir AF (DESCOVY) 200-25 MG tablet Take 1 tablet by mouth daily.  . famotidine (KLS ACID CONTROLLER MAX ST) 20 MG tablet Take 1 to 2 tablets 1 to 2 x /day as needed for Indigestion & Heartburn  . fish oil-omega-3 fatty acids 1000 MG capsule Take 1 g by mouth 2 (two) times daily.  . Flaxseed, Linseed, (FLAX SEED OIL PO) Take 1 tablet by mouth 2 (two) times daily.  Marland Kitchen gabapentin (NEURONTIN) 100 MG capsule Take 1 capsule 3 x  /day as needed for Pain  . hydrochlorothiazide (HYDRODIURIL) 25 MG tablet  Take 1 tablet daily for BP & Fluid Retention  . hyoscyamine (LEVSIN SL) 0.125 MG SL tablet Dissolve  1 to 2 tablets under tongue3 to 4 x day if needed for Nausea, Vomiting, Cramping, Bloating  or Diarrhea  . lisinopril (ZESTRIL) 20 MG tablet TAKE ONE TABLET BY MOUTH TWICE A DAY FOR BLOOD PRESSURE  . LORazepam (ATIVAN) 1 MG tablet Take 1/2-1 tablet 2 - 3 x /day ONLY if needed for Panic Attack or Anxiety Attack &  limit to 5 days /week to avoid addiction or Dementia  . Magnesium 400 MG TABS Take 400 mg by mouth daily.  . meloxicam (MOBIC) 15 MG tablet Take 1/2 to 1 tablet Daily with Food for Pain & Inflammation - limit to 5 days /week to avoid Kidney Damage  . metFORMIN (GLUCOPHAGE-XR) 500 MG 24 hr tablet TAKE ONE TO TWO TABLETS BY MOUTH TWICE DAILY FOR DIABETES  . NEEDLE, DISP, 21 G (BD ECLIPSE NEEDLE) 21G X 1" MISC Use to inject testosterone into the muscle every two weeks.  . propranolol (INDERAL) 80 MG tablet Take 1 tablet 3 x /day for BP  . rosuvastatin (CRESTOR) 20 MG tablet Take 1 tablet daily for Cholesterol  . sertraline (ZOLOFT) 50 MG tablet Take 1 tablet (50 mg total) by mouth daily.  . tamsulosin (FLOMAX) 0.4 MG CAPS capsule Take 1 tablet at Bedtime for Bladder Emptying  . testosterone cypionate  (DEPOTESTOSTERONE CYPIONATE) 200 MG/ML injection inject 2 mls intramuscularly every two weeks (Patient taking differently: inject 1.5 mLs once a week)  . VITAMIN D PO Take 10,000 Units by mouth daily.  Marland Kitchen zinc gluconate 50 MG tablet Take 50 mg by mouth daily.   No current facility-administered medications on file prior to visit.    Allergies  Allergen Reactions  . Peanut-Containing Drug Products Anaphylaxis  . Ceclor [Cefaclor] Hives and Swelling  . Sulfa Antibiotics Hives    PMHx:   Past Medical History:  Diagnosis Date  . Anxiety   . Depression   . Hidradenitis suppurativa   . Hyperlipidemia   . Hypertension   . Prediabetes    Immunization History  Administered Date(s) Administered  . Hepatitis A, Adult 02/16/2019, 01/24/2020  . Hepatitis B, adult 02/16/2019  . Hepb-cpg 01/24/2020  . Influenza Inj Mdck Quad With Preservative 11/01/2019  . Influenza Split 10/11/2015  . Influenza,inj,Quad PF,6+ Mos 10/13/2014  . PPD Test 10/11/2015, 04/19/2019  . Tdap 10/11/2015   Past Surgical History:  Procedure Laterality Date  . None      FHx:    Reviewed / unchanged  SHx:    Reviewed / unchanged   Systems Review:  Constitutional: Denies fever, chills, wt changes, headaches, insomnia, fatigue, night sweats, change in appetite. Eyes: Denies redness, blurred vision, diplopia, discharge, itchy, watery eyes.  ENT: Denies discharge, congestion, post nasal drip, epistaxis, sore throat, earache, hearing loss, dental pain, tinnitus, vertigo, sinus pain, snoring.  CV: Denies chest pain, palpitations, irregular heartbeat, syncope, dyspnea, diaphoresis, orthopnea, PND, claudication or edema. Respiratory: denies cough, dyspnea, DOE, pleurisy, hoarseness, laryngitis, wheezing.  Gastrointestinal: Denies dysphagia, odynophagia, heartburn, reflux, water brash, abdominal pain or cramps, nausea, vomiting, bloating, diarrhea, constipation, hematemesis, melena, hematochezia  or  hemorrhoids. Genitourinary: Denies dysuria, frequency, urgency, nocturia, hesitancy, discharge, hematuria or flank pain. Musculoskeletal: Denies arthralgias, myalgias, stiffness, jt. swelling, pain, limping or strain/sprain.  Skin: Denies pruritus, rash, hives, warts, acne, eczema or change in skin lesion(s). Neuro: No weakness, tremor, incoordination, spasms, paresthesia or pain. Psychiatric: Denies confusion, memory loss or  sensory loss. Endo: Denies change in weight, skin or hair change.  Heme/Lymph: No excessive bleeding, bruising or enlarged lymph nodes.  Physical Exam  BP 132/84   Pulse 78   Temp 98.6 F (37 C)   Resp 12   Ht 6\' 4"  (1.93 m)   Wt 278 lb (126.1 kg)   BMI 33.84 kg/m   Appears  well nourished, well groomed  and in no distress.  Eyes: PERRLA, EOMs, conjunctiva no swelling or erythema. Sinuses: No frontal/maxillary tenderness ENT/Mouth: EAC's clear, TM's nl w/o erythema, bulging. Nares clear w/o erythema, swelling, exudates. Oropharynx clear without erythema or exudates. Oral hygiene is good. Tongue normal, non obstructing. Hearing intact.  Neck: Supple. Thyroid not palpable. Car 2+/2+ without bruits, nodes or JVD. Chest: Respirations nl with BS clear & equal w/o rales, rhonchi, wheezing or stridor.  Cor: Heart sounds normal w/ regular rate and rhythm without sig. murmurs, gallops, clicks or rubs. Peripheral pulses normal and equal  without edema.  Abdomen: Soft & bowel sounds normal. Non-tender w/o guarding, rebound, hernias, masses or organomegaly.  Lymphatics: Unremarkable.  Musculoskeletal: Full ROM all peripheral extremities, joint stability, 5/5 strength and normal gait.  Skin: Warm, dry without exposed rashes, lesions or ecchymosis apparent.  Neuro: Cranial nerves intact, reflexes equal bilaterally. Sensory-motor testing grossly intact. Tendon reflexes grossly intact.  Pysch: Alert & oriented x 3.  Insight and judgement nl & appropriate. No  ideations.  Assessment and Plan:  1. Essential hypertension  - Continue medication, monitor blood pressure at home.  - Continue DASH diet.  Reminder to go to the ER if any CP,  SOB, nausea, dizziness, severe HA, changes vision/speech.  - CBC with Differential/Platelet - COMPLETE METABOLIC PANEL WITH GFR - Magnesium - TSH  2. Hyperlipidemia, mixed  - Continue diet/meds, exercise,& lifestyle modifications.  - Continue monitor periodic cholesterol/liver & renal functions   - Lipid panel - TSH  3. Abnormal glucose  - Hemoglobin A1c - Insulin, random  4. Vitamin D deficiency  - Continue diet, exercise  - Lifestyle modifications.  - Monitor appropriate labs. - Continue supplementation.  - VITAMIN D 25 Hydroxyl  5. Insulin resistance  - Hemoglobin A1c - Insulin, random  6. Prediabetes  - Hemoglobin A1c - Insulin, random  7. Testosterone deficiency  - Testosterone  8. Medication management  - CBC with Differential/Platelet - COMPLETE METABOLIC PANEL WITH GFR - Magnesium - Lipid panel - TSH - Hemoglobin A1c - Insulin, random - VITAMIN D 25 Hydroxy - Testosterone        Discussed  regular exercise, BP monitoring, weight control to achieve/maintain BMI less than 25 and discussed med and SE's. Recommended labs to assess and monitor clinical status with further disposition pending results of labs.  I discussed the assessment and treatment plan with the patient. The patient was provided an opportunity to ask questions and all were answered. The patient agreed with the plan and demonstrated an understanding of the instructions.  I provided over 30 minutes of exam, counseling, chart review and  complex critical decision making.  , MD

## 2020-02-07 NOTE — Patient Instructions (Signed)

## 2020-02-08 LAB — CBC WITH DIFFERENTIAL/PLATELET
Absolute Monocytes: 550 cells/uL (ref 200–950)
Basophils Absolute: 58 cells/uL (ref 0–200)
Basophils Relative: 0.9 %
Eosinophils Absolute: 230 cells/uL (ref 15–500)
Eosinophils Relative: 3.6 %
HCT: 44 % (ref 38.5–50.0)
Hemoglobin: 15.6 g/dL (ref 13.2–17.1)
Lymphs Abs: 2291 cells/uL (ref 850–3900)
MCH: 31.7 pg (ref 27.0–33.0)
MCHC: 35.5 g/dL (ref 32.0–36.0)
MCV: 89.4 fL (ref 80.0–100.0)
MPV: 11.3 fL (ref 7.5–12.5)
Monocytes Relative: 8.6 %
Neutro Abs: 3270 cells/uL (ref 1500–7800)
Neutrophils Relative %: 51.1 %
Platelets: 163 10*3/uL (ref 140–400)
RBC: 4.92 10*6/uL (ref 4.20–5.80)
RDW: 13.3 % (ref 11.0–15.0)
Total Lymphocyte: 35.8 %
WBC: 6.4 10*3/uL (ref 3.8–10.8)

## 2020-02-08 LAB — LIPID PANEL
Cholesterol: 129 mg/dL (ref <200)
HDL: 32 mg/dL — ABNORMAL LOW (ref >=40)
LDL Cholesterol (Calc): 65 mg/dL (calc)
Non-HDL Cholesterol (Calc): 97 mg/dL (calc) (ref <130)
Total CHOL/HDL Ratio: 4 (calc) (ref <5.0)
Triglycerides: 271 mg/dL — ABNORMAL HIGH (ref <150)

## 2020-02-08 LAB — COMPLETE METABOLIC PANEL WITH GFR
AG Ratio: 2.6 (calc) — ABNORMAL HIGH (ref 1.0–2.5)
ALT: 28 U/L (ref 9–46)
AST: 16 U/L (ref 10–40)
Albumin: 4.6 g/dL (ref 3.6–5.1)
Alkaline phosphatase (APISO): 41 U/L (ref 36–130)
BUN: 15 mg/dL (ref 7–25)
CO2: 26 mmol/L (ref 20–32)
Calcium: 9.1 mg/dL (ref 8.6–10.3)
Chloride: 105 mmol/L (ref 98–110)
Creat: 0.79 mg/dL (ref 0.60–1.35)
GFR, Est African American: 138 mL/min/{1.73_m2} (ref >=60)
GFR, Est Non African American: 119 mL/min/{1.73_m2} (ref >=60)
Globulin: 1.8 g/dL (calc) — ABNORMAL LOW (ref 1.9–3.7)
Glucose, Bld: 84 mg/dL (ref 65–99)
Potassium: 3.9 mmol/L (ref 3.5–5.3)
Sodium: 140 mmol/L (ref 135–146)
Total Bilirubin: 0.4 mg/dL (ref 0.2–1.2)
Total Protein: 6.4 g/dL (ref 6.1–8.1)

## 2020-02-08 LAB — VITAMIN D 25 HYDROXY (VIT D DEFICIENCY, FRACTURES): Vit D, 25-Hydroxy: 24 ng/mL — ABNORMAL LOW (ref 30–100)

## 2020-02-08 LAB — HEMOGLOBIN A1C
Hgb A1c MFr Bld: 4.9 % of total Hgb (ref <5.7)
Mean Plasma Glucose: 94 (calc)
eAG (mmol/L): 5.2 (calc)

## 2020-02-08 LAB — MAGNESIUM: Magnesium: 2.2 mg/dL (ref 1.5–2.5)

## 2020-02-08 LAB — TESTOSTERONE: Testosterone: 160 ng/dL — ABNORMAL LOW (ref 250–827)

## 2020-02-08 LAB — TSH: TSH: 0.85 mIU/L (ref 0.40–4.50)

## 2020-02-08 LAB — INSULIN, RANDOM: Insulin: 26.1 u[IU]/mL — ABNORMAL HIGH

## 2020-02-20 ENCOUNTER — Other Ambulatory Visit: Payer: Self-pay | Admitting: Adult Health

## 2020-02-20 ENCOUNTER — Other Ambulatory Visit: Payer: Self-pay | Admitting: Internal Medicine

## 2020-02-20 DIAGNOSIS — F419 Anxiety disorder, unspecified: Secondary | ICD-10-CM

## 2020-04-05 ENCOUNTER — Other Ambulatory Visit: Payer: Self-pay | Admitting: Internal Medicine

## 2020-04-05 ENCOUNTER — Other Ambulatory Visit: Payer: Self-pay | Admitting: Adult Health Nurse Practitioner

## 2020-04-05 DIAGNOSIS — F32A Depression, unspecified: Secondary | ICD-10-CM

## 2020-04-05 DIAGNOSIS — F329 Major depressive disorder, single episode, unspecified: Secondary | ICD-10-CM

## 2020-04-24 ENCOUNTER — Ambulatory Visit: Payer: Managed Care, Other (non HMO) | Admitting: Pharmacist

## 2020-05-15 ENCOUNTER — Encounter: Payer: Self-pay | Admitting: Internal Medicine

## 2020-05-15 NOTE — Progress Notes (Signed)
Annual  Screening/Preventative Visit  & Comprehensive Evaluation & Examination     This very nice 34 y.o.single WM presents for a Screening /Preventative Visit & comprehensive evaluation and management of multiple medical co-Patient has OSA but uses very infrequently due to mask intolerance. morbidities.  Patient has been followed for HTN, HLD, PreDM /Insulin Resistance and Vitamin D Deficiency. Patient has GERD controlled on his meds.      Patient is also followed in the [facility] for High Risk Homosexual behavior and is on HIV suppressive meds (Descovy), but he has since stopped the Descovy since he no longer feels that he practices high risk sexual behaviors.  Patient has hx/o Low Testosterone "212" in Nov 2016 and "207" in Jan 2017  and has self d/c'd the Testosterone replacement      HTN predates since 2014. Patient's BP has been controlled at home.  Today's BP is at goal -  132/76. Patient denies any cardiac symptoms as chest pain, palpitations, shortness of breath, dizziness or ankle swelling.     Patient's hyperlipidemia is controlled with diet and Rosuvastatin. Patient denies myalgias or other medication SE's. Last lipids were at goal except elevated Trig's: Lab Results  Component Value Date   CHOL 129 02/07/2020   HDL 32 (L) 02/07/2020   LDLCALC 65 02/07/2020   TRIG 271 (H) 02/07/2020   CHOLHDL 4.0 02/07/2020       Patient has hx/o Pre-Diabetes/Insulin Resistance (A1c 5.3%/elevated Insulin 152/2015)  and  he was initiated on Metformin for Insulin resistance and and patient denies reactive hypoglycemic symptoms, visual blurring, diabetic polys or paresthesias. Last A1c was Normal & at goal (& Insulin level = 26.1):  Lab Results  Component Value Date   HGBA1C 4.9 02/07/2020    Wt Readings from Last 3 Encounters:  05/16/20 286 lb 3.2 oz (129.8 kg)  02/07/20 278 lb (126.1 kg)  01/04/20 274 lb (124.3 kg)  02/03/14          291 lb      Finally, patient has  history of Vitamin D Deficiency ("23" / 2015 and "24" / 2016) and last vitamin D was still very low :  Lab Results  Component Value Date   VD25OH 24 (L) 02/07/2020    Current Outpatient Medications on File Prior to Visit  Medication Sig  . amLODipine (NORVASC) 10 MG tablet Take 1/2 to 1 tablet Daily for BP  . aspirin EC 81 MG tablet Take 81 mg by mouth 2 (two) times daily.  Marland Kitchen buPROPion (WELLBUTRIN XL) 300 MG 24 hr tablet TAKE ONE TABLET BY MOUTH IN THE MORNING  FOR MOOD, FOCUS AND CONCENTRATION  . cetirizine (ZYRTEC) 10 MG tablet Take 1 tablet (10 mg total) by mouth daily. (Patient taking differently: Take 10 mg by mouth as needed. )  . cloNIDine (CATAPRES) 0.2 MG tablet Take 1/2 to 1 tablet 3 x /day for BP  . emtricitabine-tenofovir AF (DESCOVY) 200-25 MG tablet Take 1 tablet by mouth daily.  . famotidine (KLS ACID CONTROLLER MAX ST) 20 MG tablet Take 1 to 2 tablets 1 to 2 x /day as needed for Indigestion & Heartburn  . fish oil-omega-3 fatty acids 1000 MG capsule Take 1 g by mouth 2 (two) times daily.  Marland Kitchen gabapentin (NEURONTIN) 100 MG capsule Take 1 capsule 3 x  /day as needed for Pain  . hydrochlorothiazide (HYDRODIURIL) 25 MG tablet Take 1 tablet daily for BP & Fluid Retention  . hyoscyamine (LEVSIN SL) 0.125 MG SL  tablet Dissolve  1 to 2 tablets under tongue3 to 4 x day if needed for Nausea, Vomiting, Cramping, Bloating  or Diarrhea  . lisinopril (ZESTRIL) 20 MG tablet TAKE ONE TABLET BY MOUTH TWICE DAILY  for blood pressure  . LORazepam (ATIVAN) 1 MG tablet Take 1/2 - 1 tablet 2 - 3 x /day ONLY if needed for Panic Attack Anxiety Attack &  limit to 5 days /week to avoid Addiction & Dementia  . meloxicam (MOBIC) 15 MG tablet Take 1/2 to 1 tablet Daily with Food for Pain & Inflammation - limit to 5 days /week to avoid Kidney Damage  . metFORMIN (GLUCOPHAGE-XR) 500 MG 24 hr tablet TAKE ONE TO TWO TABLETS BY MOUTH TWICE DAILY FOR DIABETES  . NEEDLE, DISP, 21 G (BD ECLIPSE NEEDLE) 21G X 1"  MISC Use to inject testosterone into the muscle every two weeks.  . propranolol (INDERAL) 80 MG tablet Take 1 tablet 3 x /day for BP  . rosuvastatin (CRESTOR) 20 MG tablet Take 1 tablet daily for Cholesterol  . sertraline (ZOLOFT) 50 MG tablet Take 1 tablet Daily for Mood  . tamsulosin (FLOMAX) 0.4 MG CAPS capsule Take 1 tablet at Bedtime for Bladder Emptying  . testosterone cypionate (DEPOTESTOSTERONE CYPIONATE) 200 MG/ML injection inject 2 mls intramuscularly every two weeks (Patient taking differently: inject 1.5 mLs once a week)  . VITAMIN D PO Take 10,000 Units by mouth daily.  Marland Kitchen zinc gluconate 50 MG tablet Take 50 mg by mouth daily.  Marland Kitchen b complex vitamins tablet Take 1 tablet by mouth daily.  . Flaxseed, Linseed, (FLAX SEED OIL PO) Take 1 tablet by mouth 2 (two) times daily.  . Magnesium 400 MG TABS Take 400 mg by mouth daily.   No current facility-administered medications on file prior to visit.   Allergies  Allergen Reactions  . Peanut-Containing Drug Products Anaphylaxis  . Ceclor [Cefaclor] Hives and Swelling  . Sulfa Antibiotics Hives   Past Medical History:  Diagnosis Date  . Anxiety   . Depression   . Hidradenitis suppurativa   . Hyperlipidemia   . Hypertension   . Prediabetes    Health Maintenance  Topic Date Due  . PNEUMOCOCCAL POLYSACCHARIDE VACCINE AGE 35-64 HIGH RISK  Never done  . FOOT EXAM  Never done  . COVID-19 Vaccine (1) Never done  . OPHTHALMOLOGY EXAM  12/10/2017  . INFLUENZA VACCINE  07/29/2020  . HEMOGLOBIN A1C  08/06/2020  . TETANUS/TDAP  10/10/2025  . HIV Screening  Completed   Immunization History  Administered Date(s) Administered  . Hepatitis A, Adult 02/16/2019, 01/24/2020  . Hepatitis B, adult 02/16/2019  . Hepb-cpg 01/24/2020  . Influenza Inj Mdck Quad With Preservative 11/01/2019  . Influenza Split 10/11/2015  . Influenza,inj,Quad PF,6+ Mos 10/13/2014  . PPD Test 10/11/2015, 04/19/2019, 05/16/2020  . Tdap 10/11/2015    Past  Surgical History:  Procedure Laterality Date  . None     Family History  Problem Relation Age of Onset  . Heart disease Maternal Grandmother        Died from bypass surgery  . Heart disease Mother        Bicuspid aortic valve  . Heart disease Sister        Bicuspid aortic valve  . Colitis Sister   . Diabetes Sister   . Heart disease Father        Bicuspid aortic valve  . Breast cancer Paternal Grandmother   . Diabetes Paternal Grandmother   . Esophageal cancer  Paternal Grandfather   . Colon cancer Paternal Aunt    Social History   Socioeconomic History  . Marital status: Single    Spouse name: Not on file  . Number of children: 0  . Years of education: Not on file  . Highest education level: Not on file  Occupational History  . Occupation: Biomedical engineer: [employer]  Tobacco Use  . Smoking status: Current Every Day Smoker    Packs/day: 0.60    Years: 10.00    Pack years: 6.00    Types: Cigarettes  . Smokeless tobacco: Never Used  Substance and Sexual Activity  . 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 Does c/o fatigue. Eyes: Denies redness, blurred vision, diplopia, discharge, itchy or watery eyes.  ENT: Denies discharge, congestion, post nasal drip, epistaxis, sore throat, earache, hearing loss, dental pain, Tinnitus, Vertigo, Sinus pain or snoring.  Cardio: Denies chest pain, palpitations, irregular heartbeat, syncope, dyspnea, diaphoresis, orthopnea, PND, claudication or edema Respiratory: denies cough, dyspnea, DOE, pleurisy, hoarseness, laryngitis or wheezing.  Gastrointestinal: Denies dysphagia, heartburn, reflux, water brash, pain, cramps, nausea, vomiting,  bloating, diarrhea, constipation, hematemesis, melena, hematochezia, jaundice or hemorrhoids Genitourinary: Denies dysuria, frequency, urgency, nocturia, hesitancy, discharge, hematuria or flank pain Musculoskeletal: Denies arthralgia, myalgia, stiffness, Jt. Swelling, pain, limp or strain/sprain. Denies Falls. Skin: Denies puritis, rash, hives, warts, acne, eczema or change in skin lesion Neuro: No weakness, tremor, incoordination, spasms, paresthesia or pain Psychiatric: Denies confusion, memory loss or sensory loss. Denies Depression. Endocrine: Denies change in weight, skin, hair change, nocturia, and paresthesia, diabetic polys, visual blurring or hyper / hypo glycemic episodes.  Heme/Lymph: No excessive bleeding, bruising or enlarged lymph nodes.  Physical Exam  BP 132/76   Pulse 76   Temp (!) 97 F (36.1 C)   Resp 18   Ht 6\' 5"  (1.956 m)   Wt 286 lb 3.2 oz (129.8 kg)   BMI 33.94 kg/m   General Appearance:  Over nourished and well groomed and in no apparent distress.  Eyes: PERRLA, EOMs, conjunctiva no swelling or erythema, normal fundi and vessels. Sinuses: No frontal/maxillary tenderness ENT/Mouth: EACs patent / TMs  nl. Nares clear without erythema, swelling, mucoid exudates. Oral hygiene is good. No erythema, swelling, or exudate. Tongue normal, non-obstructing. Tonsils not swollen or erythematous. Hearing normal.  Neck: Supple, thyroid not palpable. No bruits, nodes or JVD. Respiratory: Respiratory effort normal.  BS equal and clear bilateral without rales, rhonci, wheezing or stridor. Cardio: Heart sounds are normal with regular rate and rhythm and no murmurs, rubs or gallops. Peripheral pulses are normal and equal bilaterally without edema. No aortic or femoral bruits. Chest: symmetric with normal excursions and percussion.  Abdomen: Soft, with Nl bowel sounds. Nontender, no guarding, rebound, hernias, masses, or organomegaly.  Lymphatics: Non tender without  lymphadenopathy.  Musculoskeletal: Full ROM all peripheral extremities, joint stability, 5/5 strength, and normal gait. Skin: Warm and dry without rashes, lesions, cyanosis, clubbing or  ecchymosis.  Neuro: Cranial nerves intact, reflexes equal bilaterally. Normal muscle tone, no cerebellar symptoms. Sensation intact.  Pysch: Alert and oriented X 3 with  normal affect, insight and judgment appropriate.   Assessment and Plan  1. Annual Preventative/Screening Exam   2. Essential hypertension  - EKG 12-Lead - Microalbumin / creatinine urine ratio - CBC with Differential/Platelet - COMPLETE METABOLIC PANEL WITH GFR - Magnesium - TSH - Urinalysis, Routine w reflex microscopic  3. Hyperlipidemia, mixed  - Lipid panel  4. Abnormal glucose  - Hemoglobin A1c - Insulin, random  5. Screening for ischemic heart disease  6. Vitamin D deficiency  - VITAMIN D 25 Hydroxy  7. Insulin resistance  - Hemoglobin A1c - Insulin, random  8. Prediabetes  - Hemoglobin A1c - Insulin, random  9. Testosterone deficiency  - Testosterone  10. High risk homosexual behavior  11. Screening-pulmonary TB  - TB Skin Test  12. Family history of ischemic heart disease  - EKG 12-Lead  13. Smoker  - EKG 12-Lead  14. Fatigue  - Iron,Total/Total Iron Binding Cap - Vitamin B12 - Testosterone - CBC with Differential/Platelet - TSH  15. Medication management  - CBC with Differential/Platelet - COMPLETE METABOLIC PANEL WITH GFR - Magnesium - Lipid panel - TSH - Hemoglobin A1c - Insulin, random - VITAMIN D 25 Hydroxy - Urinalysis, Routine w reflex microscopic          Patient was counseled in prudent diet, weight control to achieve/maintain BMI less than 25, BP monitoring, regular exercise and medications as discussed.  Discussed med effects and SE's. Routine screening labs and tests as requested with regular follow-up as recommended. Over 40 minutes of exam, counseling, chart  review and high complex critical decision making was performed   Marinus Maw, MD

## 2020-05-15 NOTE — Patient Instructions (Signed)
Due to recent changes in healthcare laws, you may see the results of your imaging and laboratory studies on MyChart before your provider has had a chance to review them.  We understand that in some cases there may be results that are confusing or concerning to you. Not all laboratory results come back in the same time frame and the provider may be waiting for multiple results in order to interpret others.  Please give [place] 48 hours in order for your provider to thoroughly review all the results before contacting the office for clarification of your results.   +++++++++++++++++++++++++++++++++  Vit D  & Vit C 1,000 mg   are recommended to help protect  against the Covid-19 and other Corona viruses.    Also it's recommended  to take  Zinc 50 mg  to help  protect against the Covid-19   and best place to get  is also on [email or web address]  and don't pay more than 6-8 cents /pill !  ================================= Coronavirus (COVID-19) Are you at risk?  Are you at risk for the Coronavirus (COVID-19)?  To be considered HIGH RISK for Coronavirus (COVID-19), you have to meet the following criteria:  . Traveled to [place], [place], [place], [place] or [place]; or in the [place] to [place], [place], [place]  . or [place]; and have fever, cough, and shortness of breath within the last 2 weeks of travel OR . Been in close contact with a person diagnosed with COVID-19 within the last 2 weeks and have  . fever, cough,and shortness of breath .  . IF YOU DO NOT MEET THESE CRITERIA, YOU ARE CONSIDERED LOW RISK FOR COVID-19.  What to do if you are HIGH RISK for COVID-19?  Marland Kitchen If you are having a medical emergency, call 911. . Seek medical care right away. Before you go to a doctor's office, urgent care or emergency department, .  call ahead and tell them about your recent travel, contact with someone diagnosed with COVID-19  .  and your symptoms.  . You should receive instructions from your  physician's office regarding next steps of care.  . When you arrive at healthcare provider, tell the healthcare staff immediately you have returned from  . visiting [place], [place], [place], [place] or [place]; or traveled in the [place] to [place], [place],  . [place] or [place] in the last two weeks or you have been in close contact with a person diagnosed with  . COVID-19 in the last 2 weeks.   . Tell the health care staff about your symptoms: fever, cough and shortness of breath. . After you have been seen by a medical provider, you will be either: o Tested for (COVID-19) and discharged home on quarantine except to seek medical care if  o symptoms worsen, and asked to  - Stay home and avoid contact with others until you get your results (4-5 days)  - Avoid travel on public transportation if possible (such as bus, train, or airplane) or o Sent to the Emergency Department by EMS for evaluation, COVID-19 testing  and  o possible admission depending on your condition and test results.  What to do if you are LOW RISK for COVID-19?  Reduce your risk of any infection by using the same precautions used for avoiding the common cold or flu:  Marland Kitchen Wash your hands often with soap and warm water for at least 20 seconds.  If soap and water are not readily  available,  . use an alcohol-based hand sanitizer with at least 60% alcohol.  . If coughing or sneezing, cover your mouth and nose by coughing or sneezing into the elbow areas of your shirt or coat, .  into a tissue or into your sleeve (not your hands). . Avoid shaking hands with others and consider head nods or verbal greetings only. . Avoid touching your eyes, nose, or mouth with unwashed hands.  . Avoid close contact with people who are sick. . Avoid places or events with large numbers of people in one location, like concerts or sporting events. . Carefully consider travel plans you have or are making. . If you are planning any travel  outside or inside the [place], visit the CDC's Travelers' Health webpage for the latest health notices. . If you have some symptoms but not all symptoms, continue to monitor at home and seek medical attention  . if your symptoms worsen. . If you are having a medical emergency, call 911. >>>>>>>>>>>>>>>>>>>>>>>> Preventive Care for Adults  A healthy lifestyle and preventive care can promote health and wellness. Preventive health guidelines for men include the following key practices:  A routine yearly physical is a good way to check with your health care provider about your health and preventative screening. It is a chance to share any concerns and updates on your health and to receive a thorough exam.  Visit your dentist for a routine exam and preventative care every 6 months. Brush your teeth twice a day and floss once a day. Good oral hygiene prevents tooth decay and gum disease.  The frequency of eye exams is based on your age, health, family medical history, use of contact lenses, and other factors. Follow your health care provider's recommendations for frequency of eye exams.  Eat a healthy diet. Foods such as vegetables, fruits, whole grains, low-fat dairy products, and lean protein foods contain the nutrients you need without too many calories. Decrease your intake of foods high in solid fats, added sugars, and salt. Eat the right amount of calories for you. Get information about a proper diet from your health care provider, if necessary.  Regular physical exercise is one of the most important things you can do for your health. Most adults should get at least 150 minutes of moderate-intensity exercise (any activity that increases your heart rate and causes you to sweat) each week. In addition, most adults need muscle-strengthening exercises on 2 or more days a week.  Maintain a healthy weight. The body mass index (BMI) is a screening tool to identify possible weight problems. It provides an  estimate of body fat based on height and weight. Your health care provider can find your BMI and can help you achieve or maintain a healthy weight. For adults 20 years and older:  A BMI below 18.5 is considered underweight.  A BMI of 18.5 to 24.9 is normal.  A BMI of 25 to 29.9 is considered overweight.  A BMI of 30 and above is considered obese.  Maintain normal blood lipids and cholesterol levels by exercising and minimizing your intake of saturated fat. Eat a balanced diet with plenty of fruit and vegetables. Blood tests for lipids and cholesterol should begin at age 45 and be repeated every 5 years. If your lipid or cholesterol levels are high, you are over 50, or you are at high risk for heart disease, you may need your cholesterol levels checked more frequently. Ongoing high lipid and cholesterol levels should be treated  with medicines if diet and exercise are not working.  If you smoke, find out from your health care provider how to quit. If you do not use tobacco, do not start.  Lung cancer screening is recommended for adults aged 2-80 years who are at high risk for developing lung cancer because of a history of smoking. A yearly low-dose CT scan of the lungs is recommended for people who have at least a 30-pack-year history of smoking and are a current smoker or have quit within the past 15 years. A pack year of smoking is smoking an average of 1 pack of cigarettes a day for 1 year (for example: 1 pack a day for 30 years or 2 packs a day for 15 years). Yearly screening should continue until the smoker has stopped smoking for at least 15 years. Yearly screening should be stopped for people who develop a health problem that would prevent them from having lung cancer treatment.  If you choose to drink alcohol, do not have more than 2 drinks per day. One drink is considered to be 12 ounces (355 mL) of beer, 5 ounces (148 mL) of wine, or 1.5 ounces (44 mL) of liquor.  High blood pressure  causes heart disease and increases the risk of stroke. Your blood pressure should be checked. Ongoing high blood pressure should be treated with medicines, if weight loss and exercise are not effective.  If you are 35-59 years old, ask your health care provider if you should take aspirin to prevent heart disease.  Diabetes screening involves taking a blood sample to check your fasting blood sugar level. Testing should be considered at a younger age or be carried out more frequently if you are overweight and have at least 1 risk factor for diabetes.  Colorectal cancer can be detected and often prevented. Most routine colorectal cancer screening begins at the age of 50 and continues through age 67. However, your health care provider may recommend screening at an earlier age if you have risk factors for colon cancer. On a yearly basis, your health care provider may provide home test kits to check for hidden blood in the stool. Use of a small camera at the end of a tube to directly examine the colon (sigmoidoscopy or colonoscopy) can detect the earliest forms of colorectal cancer. Talk to your health care provider about this at age 75, when routine screening begins. Direct exam of the colon should be repeated every 5-10 years through age 50, unless early forms of precancerous polyps or small growths are found.  Screening for abdominal aortic aneurysm (AAA)  are recommended for persons over age 31 who have history of hypertensionor who are current or former smokers.  Talk with your health care provider about prostate cancer screening.  Testicular cancer screening is recommended for adult males. Screening includes self-exam, a health care provider exam, and other screening tests. Consult with your health care provider about any symptoms you have or any concerns you have about testicular cancer.  Use sunscreen. Apply sunscreen liberally and repeatedly throughout the day. You should seek shade when your shadow  is shorter than you. Protect yourself by wearing long sleeves, pants, a wide-brimmed hat, and sunglasses year round, whenever you are outdoors.  Once a month, do a whole-body skin exam, using a mirror to look at the skin on your back. Tell your health care provider about new moles, moles that have irregular borders, moles that are larger than a pencil eraser, or moles that  have changed in shape or color.  Stay current with required vaccines (immunizations).  Influenza vaccine. All adults should be immunized every year.  Tetanus, diphtheria, and acellular pertussis (Td, Tdap) vaccine. An adult who has not previously received Tdap or who does not know his vaccine status should receive 1 dose of Tdap. This initial dose should be followed by tetanus and diphtheria toxoids (Td) booster doses every 10 years. Adults with an unknown or incomplete history of completing a 3-dose immunization series with Td-containing vaccines should begin or complete a primary immunization series including a Tdap dose. Adults should receive a Td booster every 10 years.  Zoster vaccine. One dose is recommended for adults aged 60 years or older unless certain conditions are present.    Pneumococcal 13-valent conjugate (PCV13) vaccine. When indicated, a person who is uncertain of his immunization history and has no record of immunization should receive the PCV13 vaccine. An adult aged 19 years or older who has certain medical conditions and has not been previously immunized should receive 1 dose of PCV13 vaccine. This PCV13 should be followed with a dose of pneumococcal polysaccharide (PPSV23) vaccine. The PPSV23 vaccine dose should be obtained at least 8 weeks after the dose of PCV13 vaccine. An adult aged 19 years or older who has certain medical conditions and previously received 1 or more doses of PPSV23 vaccine should receive 1 dose of PCV13. The PCV13 vaccine dose should be obtained 1 or more years after the last PPSV23  vaccine dose.    Pneumococcal polysaccharide (PPSV23) vaccine. When PCV13 is also indicated, PCV13 should be obtained first. All adults aged 65 years and older should be immunized. An adult younger than age 65 years who has certain medical conditions should be immunized. Any person who resides in a nursing home or long-term care facility should be immunized. An adult smoker should be immunized. People with an immunocompromised condition and certain other conditions should receive both PCV13 and PPSV23 vaccines. People with human immunodeficiency virus (HIV) infection should be immunized as soon as possible after diagnosis. Immunization during chemotherapy or radiation therapy should be avoided. Routine use of PPSV23 vaccine is not recommended for American Indians, [place] Natives, or people younger than 65 years unless there are medical conditions that require PPSV23 vaccine. When indicated, people who have unknown immunization and have no record of immunization should receive PPSV23 vaccine. One-time revaccination 5 years after the first dose of PPSV23 is recommended for people aged 19-64 years who have chronic kidney failure, nephrotic syndrome, asplenia, or immunocompromised conditions. People who received 1-2 doses of PPSV23 before age 65 years should receive another dose of PPSV23 vaccine at age 65 years or later if at least 5 years have passed since the previous dose. Doses of PPSV23 are not needed for people immunized with PPSV23 at or after age 65 years.  Hepatitis A vaccine. Adults who wish to be protected from this disease, have certain high-risk conditions, work with hepatitis A-infected animals, work in hepatitis A research labs, or travel to or work in countries with a high rate of hepatitis A should be immunized. Adults who were previously unvaccinated and who anticipate close contact with an international adoptee during the first 60 days after arrival in the [place] from a country with a  high rate of hepatitis A should be immunized.  Hepatitis B vaccine. Adults should be immunized if they wish to be protected from this disease, have certain high-risk conditions, may be exposed to blood or other infectious   body fluids, are household contacts or sex partners of hepatitis B positive people, are clients or workers in certain care facilities, or travel to or work in countries with a high rate of hepatitis B.  Preventive Service / Frequency  Ages 19 to 28  Blood pressure check.  Lipid and cholesterol check.  Hepatitis C blood test.** / For any individual with known risks for hepatitis C.  Skin self-exam. / Monthly.  Influenza vaccine. / Every year.  Tetanus, diphtheria, and acellular pertussis (Tdap, Td) vaccine.** / Consult your health care provider. 1 dose of Td every 10 years.  HPV vaccine. / 3 doses over 6 months, if 49 or younger.  Measles, mumps, rubella (MMR) vaccine.** / You need at least 1 dose of MMR if you were born in 1957 or later. You may also need a second dose.  Pneumococcal 13-valent conjugate (PCV13) vaccine.** / Consult your health care provider.  Pneumococcal polysaccharide (PPSV23) vaccine.** / 1 to 2 doses if you smoke cigarettes or if you have certain conditions.  Meningococcal vaccine.** / 1 dose if you are age 33 to 88 years and a Market researcher living in a residence hall, or have one of several medical conditions. You may also need additional booster doses.  Hepatitis A vaccine.** / Consult your health care provider.  Hepatitis B vaccine.** / Consult your health care provider. +++++++++ Recommend Adult Low Dose Aspirin or  coated  Aspirin 81 mg daily  To reduce risk of Colon Cancer 40 %,  Skin Cancer 26 % ,  Melanoma 46%  and  Pancreatic cancer 60% ++++++++++++++++++ Vitamin D goal  is between 70-100.  Please make sure that you are taking your Vitamin D as directed.  It is very important as a natural anti-inflammatory   helping hair, skin, and nails, as well as reducing stroke and heart attack risk.  It helps your bones and helps with mood. It also decreases numerous cancer risks so please take it as directed.  Low Vit D is associated with a 200-300% higher risk for CANCER  and 200-300% higher risk for HEART   ATTACK  &  STROKE.   .....................................Marland Kitchen It is also associated with higher death rate at younger ages,  autoimmune diseases like Rheumatoid arthritis, Lupus, Multiple Sclerosis.    Also many other serious conditions, like depression, Alzheimer's Dementia, infertility, muscle aches, fatigue, fibromyalgia - just to name a few. +++++++++++++++++++++ Recommend the book "The END of DIETING" by Dr Excell Seltzer  & the book "The END of DIABETES " by Dr Excell Seltzer At [email or web address] - get book & Audio CD's    Being diabetic has a  300% increased risk for heart attack, stroke, cancer, and alzheimer- type vascular dementia. It is very important that you work harder with diet by avoiding all foods that are white. Avoid white rice (brown & wild rice is OK), white potatoes (sweetpotatoes in moderation is OK), White bread or wheat bread or anything made out of white flour like bagels, donuts, rolls, buns, biscuits, cakes, pastries, cookies, pizza crust, and pasta (made from white flour & egg whites) - vegetarian pasta or spinach or wheat pasta is OK. Multigrain breads like Arnold's or [employer], or multigrain sandwich thins or flatbreads.  Diet, exercise and weight loss can reverse and cure diabetes in the early stages.  Diet, exercise and weight loss is very important in the control and prevention of complications of diabetes which affects every system in your body, ie. Brain - dementia/stroke, eyes -  glaucoma/blindness, heart - heart attack/heart failure, kidneys - dialysis, stomach - gastric paralysis, intestines - malabsorption, nerves - severe painful neuritis, circulation - gangrene & loss of a  leg(s), and finally cancer and Alzheimers.    I recommend avoid fried & greasy foods,  sweets/candy, white rice (brown or wild rice or Quinoa is OK), white potatoes (sweet potatoes are OK) - anything made from white flour - bagels, doughnuts, rolls, buns, biscuits,white and wheat breads, pizza crust and traditional pasta made of white flour & egg white(vegetarian pasta or spinach or wheat pasta is OK).  Multi-grain bread is OK - like multi-grain flat bread or sandwich thins. Avoid alcohol in excess. Exercise is also important.    Eat all the vegetables you want - avoid meat, especially red meat and dairy - especially cheese.  Cheese is the most concentrated form of trans-fats which is the worst thing to clog up our arteries. Veggie cheese is OK which can be found in the fresh produce section at [employer] or [employer] or [employer]  +++++++++++++++++++ DASH Eating Plan  DASH stands for "Dietary Approaches to Stop Hypertension."   The DASH eating plan is a healthy eating plan that has been shown to reduce high blood pressure (hypertension). Additional health benefits may include reducing the risk of type 2 diabetes mellitus, heart disease, and stroke. The DASH eating plan may also help with weight loss. WHAT DO I NEED TO KNOW ABOUT THE DASH EATING PLAN? For the DASH eating plan, you will follow these general guidelines:  Choose foods with a percent daily value for sodium of less than 5% (as listed on the food label).  Use salt-free seasonings or herbs instead of table salt or sea salt.  Check with your health care provider or pharmacist before using salt substitutes.  Eat lower-sodium products, often labeled as "lower sodium" or "no salt added."  Eat fresh foods.  Eat more vegetables, fruits, and low-fat dairy products.  Choose whole grains. Look for the word "whole" as the first word in the ingredient list.  Choose fish   Limit sweets, desserts, sugars, and sugary drinks.  Choose  heart-healthy fats.  Eat veggie cheese   Eat more home-cooked food and less restaurant, buffet, and fast food.  Limit fried foods.  Cook foods using methods other than frying.  Limit canned vegetables. If you do use them, rinse them well to decrease the sodium.  When eating at a restaurant, ask that your food be prepared with less salt, or no salt if possible.                      WHAT FOODS CAN I EAT? Read Dr Fara Olden Fuhrman's books on The End of Dieting & The End of Diabetes  Grains Whole grain or whole wheat bread. Brown rice. Whole grain or whole wheat pasta. Quinoa, bulgur, and whole grain cereals. Low-sodium cereals. Corn or whole wheat flour tortillas. Whole grain cornbread. Whole grain crackers. Low-sodium crackers.  Vegetables Fresh or frozen vegetables (raw, steamed, roasted, or grilled). Low-sodium or reduced-sodium tomato and vegetable juices. Low-sodium or reduced-sodium tomato sauce and paste. Low-sodium or reduced-sodium canned vegetables.   Fruits All fresh, canned (in natural juice), or frozen fruits.  Protein Products  All fish and seafood.  Dried beans, peas, or lentils. Unsalted nuts and seeds. Unsalted canned beans.  Dairy Low-fat dairy products, such as skim or 1% milk, 2% or reduced-fat cheeses, low-fat ricotta or cottage cheese, or plain low-fat yogurt. Low-sodium  or reduced-sodium cheeses.  Fats and Oils Tub margarines without trans fats. Light or reduced-fat mayonnaise and salad dressings (reduced sodium). Avocado. Safflower, olive, or canola oils. Natural peanut or almond butter.  Other Unsalted popcorn and pretzels. The items listed above may not be a complete list of recommended foods or beverages. Contact your dietitian for more options.  +++++++++++++++++++  WHAT FOODS ARE NOT RECOMMENDED? Grains/ White flour or wheat flour White bread. White pasta. White rice. Refined cornbread. Bagels and croissants. Crackers that contain trans  fat.  Vegetables  Creamed or fried vegetables. Vegetables in a . Regular canned vegetables. Regular canned tomato sauce and paste. Regular tomato and vegetable juices.  Fruits Dried fruits. Canned fruit in light or heavy syrup. Fruit juice.  Meat and Other Protein Products Meat in general - RED meat & White meat.  Fatty cuts of meat. Ribs, chicken wings, all processed meats as bacon, sausage, bologna, salami, fatback, hot dogs, bratwurst and packaged luncheon meats.  Dairy Whole or 2% milk, cream, half-and-half, and cream cheese. Whole-fat or sweetened yogurt. Full-fat cheeses or blue cheese. Non-dairy creamers and whipped toppings. Processed cheese, cheese spreads, or cheese curds.  Condiments Onion and garlic salt, seasoned salt, table salt, and sea salt. Canned and packaged gravies. Worcestershire sauce. Tartar sauce. Barbecue sauce. Teriyaki sauce. Soy sauce, including reduced sodium. Steak sauce. Fish sauce. Oyster sauce. Cocktail sauce. Horseradish. Ketchup and mustard. Meat flavorings and tenderizers. Bouillon cubes. Hot sauce. Tabasco sauce. Marinades. Taco seasonings. Relishes.  Fats and Oils Butter, stick margarine, lard, shortening and bacon fat. Coconut, palm kernel, or palm oils. Regular salad dressings.  Pickles and olives. Salted popcorn and pretzels.  The items listed above may not be a complete list of foods and beverages to avoid.

## 2020-05-16 ENCOUNTER — Ambulatory Visit (INDEPENDENT_AMBULATORY_CARE_PROVIDER_SITE_OTHER): Payer: Managed Care, Other (non HMO) | Admitting: Internal Medicine

## 2020-05-16 ENCOUNTER — Other Ambulatory Visit: Payer: Self-pay

## 2020-05-16 ENCOUNTER — Encounter: Payer: Self-pay | Admitting: Internal Medicine

## 2020-05-16 VITALS — BP 132/76 | HR 76 | Temp 97.0°F | Resp 18 | Ht 77.0 in | Wt 286.2 lb

## 2020-05-16 DIAGNOSIS — Z7252 High risk homosexual behavior: Secondary | ICD-10-CM

## 2020-05-16 DIAGNOSIS — I10 Essential (primary) hypertension: Secondary | ICD-10-CM

## 2020-05-16 DIAGNOSIS — R7303 Prediabetes: Secondary | ICD-10-CM

## 2020-05-16 DIAGNOSIS — E559 Vitamin D deficiency, unspecified: Secondary | ICD-10-CM

## 2020-05-16 DIAGNOSIS — Z136 Encounter for screening for cardiovascular disorders: Secondary | ICD-10-CM | POA: Diagnosis not present

## 2020-05-16 DIAGNOSIS — Z79899 Other long term (current) drug therapy: Secondary | ICD-10-CM

## 2020-05-16 DIAGNOSIS — F17200 Nicotine dependence, unspecified, uncomplicated: Secondary | ICD-10-CM

## 2020-05-16 DIAGNOSIS — Z0001 Encounter for general adult medical examination with abnormal findings: Secondary | ICD-10-CM

## 2020-05-16 DIAGNOSIS — R5383 Other fatigue: Secondary | ICD-10-CM

## 2020-05-16 DIAGNOSIS — E782 Mixed hyperlipidemia: Secondary | ICD-10-CM

## 2020-05-16 DIAGNOSIS — E88819 Insulin resistance, unspecified: Secondary | ICD-10-CM

## 2020-05-16 DIAGNOSIS — R7309 Other abnormal glucose: Secondary | ICD-10-CM

## 2020-05-16 DIAGNOSIS — E8881 Metabolic syndrome: Secondary | ICD-10-CM

## 2020-05-16 DIAGNOSIS — Z111 Encounter for screening for respiratory tuberculosis: Secondary | ICD-10-CM | POA: Diagnosis not present

## 2020-05-16 DIAGNOSIS — Z0000 Encounter for general adult medical examination without abnormal findings: Secondary | ICD-10-CM | POA: Diagnosis not present

## 2020-05-16 DIAGNOSIS — E349 Endocrine disorder, unspecified: Secondary | ICD-10-CM

## 2020-05-16 DIAGNOSIS — Z8249 Family history of ischemic heart disease and other diseases of the circulatory system: Secondary | ICD-10-CM | POA: Diagnosis not present

## 2020-05-17 ENCOUNTER — Other Ambulatory Visit: Payer: Self-pay | Admitting: Internal Medicine

## 2020-05-17 DIAGNOSIS — F419 Anxiety disorder, unspecified: Secondary | ICD-10-CM

## 2020-05-17 LAB — LIPID PANEL
Cholesterol: 137 mg/dL (ref <200)
HDL: 35 mg/dL — ABNORMAL LOW (ref >=40)
LDL Cholesterol (Calc): 73 mg/dL (calc)
Non-HDL Cholesterol (Calc): 102 mg/dL (calc) (ref <130)
Total CHOL/HDL Ratio: 3.9 (calc) (ref <5.0)
Triglycerides: 197 mg/dL — ABNORMAL HIGH (ref <150)

## 2020-05-17 LAB — COMPLETE METABOLIC PANEL WITH GFR
AG Ratio: 2.4 (calc) (ref 1.0–2.5)
ALT: 38 U/L (ref 9–46)
AST: 23 U/L (ref 10–40)
Albumin: 4.8 g/dL (ref 3.6–5.1)
Alkaline phosphatase (APISO): 47 U/L (ref 36–130)
BUN: 15 mg/dL (ref 7–25)
CO2: 28 mmol/L (ref 20–32)
Calcium: 9.5 mg/dL (ref 8.6–10.3)
Chloride: 104 mmol/L (ref 98–110)
Creat: 0.74 mg/dL (ref 0.60–1.35)
GFR, Est African American: 141 mL/min/{1.73_m2} (ref >=60)
GFR, Est Non African American: 122 mL/min/{1.73_m2} (ref >=60)
Globulin: 2 g/dL (calc) (ref 1.9–3.7)
Glucose, Bld: 102 mg/dL — ABNORMAL HIGH (ref 65–99)
Potassium: 4.1 mmol/L (ref 3.5–5.3)
Sodium: 140 mmol/L (ref 135–146)
Total Bilirubin: 0.5 mg/dL (ref 0.2–1.2)
Total Protein: 6.8 g/dL (ref 6.1–8.1)

## 2020-05-17 LAB — CBC WITH DIFFERENTIAL/PLATELET
Absolute Monocytes: 388 cells/uL (ref 200–950)
Basophils Absolute: 48 cells/uL (ref 0–200)
Basophils Relative: 0.7 %
Eosinophils Absolute: 238 cells/uL (ref 15–500)
Eosinophils Relative: 3.5 %
HCT: 43.3 % (ref 38.5–50.0)
Hemoglobin: 15.2 g/dL (ref 13.2–17.1)
Lymphs Abs: 2230 cells/uL (ref 850–3900)
MCH: 31.7 pg (ref 27.0–33.0)
MCHC: 35.1 g/dL (ref 32.0–36.0)
MCV: 90.4 fL (ref 80.0–100.0)
MPV: 11.6 fL (ref 7.5–12.5)
Monocytes Relative: 5.7 %
Neutro Abs: 3896 cells/uL (ref 1500–7800)
Neutrophils Relative %: 57.3 %
Platelets: 156 10*3/uL (ref 140–400)
RBC: 4.79 10*6/uL (ref 4.20–5.80)
RDW: 12.8 % (ref 11.0–15.0)
Total Lymphocyte: 32.8 %
WBC: 6.8 10*3/uL (ref 3.8–10.8)

## 2020-05-17 LAB — IRON, TOTAL/TOTAL IRON BINDING CAP
%SAT: 39 % (calc) (ref 20–48)
Iron: 118 ug/dL (ref 50–180)
TIBC: 306 mcg/dL (calc) (ref 250–425)

## 2020-05-17 LAB — TSH: TSH: 0.91 mIU/L (ref 0.40–4.50)

## 2020-05-17 LAB — TESTOSTERONE: Testosterone: 177 ng/dL — ABNORMAL LOW (ref 250–827)

## 2020-05-17 LAB — INSULIN, RANDOM: Insulin: 29.3 u[IU]/mL — ABNORMAL HIGH

## 2020-05-17 LAB — VITAMIN B12: Vitamin B-12: 321 pg/mL (ref 200–1100)

## 2020-05-17 LAB — HEMOGLOBIN A1C
Hgb A1c MFr Bld: 4.6 % of total Hgb (ref <5.7)
Mean Plasma Glucose: 85 (calc)
eAG (mmol/L): 4.7 (calc)

## 2020-05-17 LAB — VITAMIN D 25 HYDROXY (VIT D DEFICIENCY, FRACTURES): Vit D, 25-Hydroxy: 34 ng/mL (ref 30–100)

## 2020-05-17 LAB — MAGNESIUM: Magnesium: 2.2 mg/dL (ref 1.5–2.5)

## 2020-06-16 ENCOUNTER — Other Ambulatory Visit: Payer: Self-pay | Admitting: Internal Medicine

## 2020-06-16 MED ORDER — AZITHROMYCIN 250 MG PO TABS: ORAL_TABLET | ORAL | 0 refills | Status: DC

## 2020-08-09 ENCOUNTER — Other Ambulatory Visit: Payer: Self-pay | Admitting: Internal Medicine

## 2020-08-09 MED ORDER — DEXAMETHASONE 4 MG PO TABS: ORAL_TABLET | ORAL | 0 refills | Status: DC

## 2020-08-19 NOTE — Progress Notes (Deleted)
3 Month Follow Up   Assessment and Plan:   Diagnoses and all orders for this visit:  Essential hypertension  Hyperlipidemia, mixed  Abnormal glucose  Vitamin D deficiency  Testosterone deficiency  Gastroesophageal reflux disease, unspecified whether esophagitis present  Obesity (BMI 30.0-34.9)  OSA (obstructive sleep apnea)  High risk homosexual behavior  Smoker      Continue diet and meds as discussed. Further disposition pending results of labs. Discussed med's effects and SE's.  Patient agrees with plan of care and opportunity to ask questions/voice concerns. Over 30 minutes of chart review, face to face interview, exam, counseling, and critical decision making was performed.   Future Appointments  Date Time Provider Department Center  08/21/2020  8:45 AM Elder Negus, NP [facility] None  11/27/2020  9:30 AM Lucky Cowboy, MD [facility] None  05/17/2021 10:00 AM Lucky Cowboy, MD [facility] None    ----------------------------------------------------------------------------------------------------------------------  HPI 34 y.o. male  presents for 3 month follow up on HTN, HLD, GERD, abnormal glucose with history of pre-diabetes, OSA with infrequent use,weight and vitamin D deficiency.   BMI is There is no height or weight on file to calculate BMI., he {HAS HAS RUE:45409} been working on diet and exercise. Wt Readings from Last 3 Encounters:  05/16/20 286 lb 3.2 oz (129.8 kg)  02/07/20 278 lb (126.1 kg)  01/04/20 274 lb (124.3 kg)     HTN predates 2014 His blood pressure has been controlled at home, today their BP is    He does not workout. He denies any cardiac symptoms, chest pains, palpitations, shortness of breath, dizziness or lower extremity edema.      He is on cholesterol medication Rosuvastatin and denies myalgias.   His cholesterol is not at goal. The cholesterol last visit was:   Lab Results  Component Value Date   CHOL 137  05/16/2020   HDL 35 (L) 05/16/2020   LDLCALC 73 05/16/2020   TRIG 197 (H) 05/16/2020   CHOLHDL 3.9 05/16/2020    He has not been working on diet and exercise for prediabetes, and denies polydipsia, polyuria, visual disturbances, vomiting and weight loss. Last A1C in the office was:  Lab Results  Component Value Date   HGBA1C 4.6 05/16/2020     Patient does not have history of CKD.  Their last GFR was:  Lab Results  Component Value Date   GFRNONAA 122 05/16/2020   Lab Results  Component Value Date   GFRAA 141 05/16/2020     Patient is on Vitamin D supplement for deficiency (23, 2015 and 24, 2016) Lab Results  Component Value Date   VD25OH 34 05/16/2020     He follows with [facility] for high risk sexual behavior and on HIV suppressive meds (Descovy).  He is no longer taking this medication ****  He has history of low testosterone (212, 2016 and 207, 2017)  He is not on testosterone replacement at this time.   Current Medications:  Current Outpatient Medications on File Prior to Visit  Medication Sig  . amLODipine (NORVASC) 10 MG tablet Take 1/2 to 1 tablet Daily for BP  . aspirin EC 81 MG tablet Take 81 mg by mouth 2 (two) times daily.  Marland Kitchen azithromycin (ZITHROMAX) 250 MG tablet Take 2 tablets with Food on  Day 1, then 1 tablet Daily with Food for Infection  . b complex vitamins tablet Take 1 tablet by mouth daily.  Marland Kitchen buPROPion (WELLBUTRIN XL) 300 MG 24 hr tablet TAKE ONE TABLET BY  MOUTH IN THE MORNING  FOR MOOD, FOCUS AND CONCENTRATION  . cetirizine (ZYRTEC) 10 MG tablet Take 1 tablet (10 mg total) by mouth daily. (Patient taking differently: Take 10 mg by mouth as needed. )  . cloNIDine (CATAPRES) 0.2 MG tablet Take 1 tablet 3 x /day for BP  . dexamethasone (DECADRON) 4 MG tablet Take 1 tab 3 x day - 2 days, then 2 x day - 2 days, then 1 tab daily  . famotidine (KLS ACID CONTROLLER MAX ST) 20 MG tablet Take 1 to 2 tablets 1 to 2 x /day as needed for Indigestion  & Heartburn  . fish oil-omega-3 fatty acids 1000 MG capsule Take 1 g by mouth 2 (two) times daily.  . Flaxseed, Linseed, (FLAX SEED OIL PO) Take 1 tablet by mouth 2 (two) times daily.  Marland Kitchen gabapentin (NEURONTIN) 100 MG capsule Take 1 capsule 3 x  /day as needed for Pain  . hydrochlorothiazide (HYDRODIURIL) 25 MG tablet Take 1 tablet daily for BP & Fluid Retention  . hyoscyamine (LEVSIN SL) 0.125 MG SL tablet Dissolve  1 to 2 tablets under tongue3 to 4 x day if needed for Nausea, Vomiting, Cramping, Bloating  or Diarrhea  . lisinopril (ZESTRIL) 20 MG tablet TAKE ONE TABLET BY MOUTH TWICE DAILY  for blood pressure  . LORazepam (ATIVAN) 1 MG tablet Take 1/2 - 1 tablet 2 - 3 x /day ONLY if needed for Anxiety Attack &  limit to 5 days /week to avoid Addiction & Dementia  . Magnesium 400 MG TABS Take 400 mg by mouth daily.  . meloxicam (MOBIC) 15 MG tablet Take 1/2 to 1 tablet Daily with Food for Pain & Inflammation - limit to 5 days /week to avoid Kidney Damage  . metFORMIN (GLUCOPHAGE-XR) 500 MG 24 hr tablet TAKE ONE TO TWO TABLETS BY MOUTH TWICE DAILY FOR DIABETES  . NEEDLE, DISP, 21 G (BD ECLIPSE NEEDLE) 21G X 1" MISC Use to inject testosterone into the muscle every two weeks.  . propranolol (INDERAL) 80 MG tablet Take 1 tablet 3 x /day for BP  . rosuvastatin (CRESTOR) 20 MG tablet Take 1 tablet daily for Cholesterol  . sertraline (ZOLOFT) 50 MG tablet Take 1 Tablet Daily For Mood  . tamsulosin (FLOMAX) 0.4 MG CAPS capsule Take 1 tablet at Bedtime for Bladder Emptying  . testosterone cypionate (DEPOTESTOSTERONE CYPIONATE) 200 MG/ML injection inject 2 mls intramuscularly every two weeks (Patient taking differently: inject 1.5 mLs once a week)  . VITAMIN D PO Take 10,000 Units by mouth daily.  Marland Kitchen zinc gluconate 50 MG tablet Take 50 mg by mouth daily.   No current facility-administered medications on file prior to visit.    Allergies:  Allergies  Allergen Reactions  . Peanut-Containing Drug  Products Anaphylaxis  . Ceclor [Cefaclor] Hives and Swelling  . Sulfa Antibiotics Hives      Medical History:  Past Medical History:  Diagnosis Date  . Anxiety   . Depression   . Hidradenitis suppurativa   . Hyperlipidemia   . Hypertension   . Prediabetes      Family history- Reviewed and unchanged Family History  Problem Relation Age of Onset  . Heart disease Maternal Grandmother        Died from bypass surgery  . Heart disease Mother        Bicuspid aortic valve  . Heart disease Sister        Bicuspid aortic valve  . Colitis Sister   . Diabetes  Sister   . Heart disease Father        Bicuspid aortic valve  . Breast cancer Paternal Grandmother   . Diabetes Paternal Grandmother   . Esophageal cancer Paternal Grandfather   . Colon cancer Paternal Aunt      Social history- Reviewed and unchanged Social History   Tobacco Use  . Smoking status: Current Every Day Smoker    Packs/day: 0.60    Years: 10.00    Pack years: 6.00    Types: Cigarettes  . Smokeless tobacco: Never Used  Vaping Use  . Vaping Use: Never used  Substance Use Topics  . Alcohol use: Yes    Comment: social  . Drug use: Yes    Types: Marijuana     Names of Other Physician/Practitioners you currently use: 1. [facility] here for primary care 2. Eye Exam: *** 3. Dental Exam ***   Patient Care Team: Lucky Cowboy, MD as PCP - General (Internal Medicine) Rollene Rotunda, MD as Consulting Physician (Cardiology) Clance, Maree Krabbe, MD as Consulting Physician (Pulmonary Disease)    Screening Tests: Immunization History  Administered Date(s) Administered  . Hepatitis A, Adult 02/16/2019, 01/24/2020  . Hepatitis B, adult 02/16/2019  . Hepb-cpg 01/24/2020  . Influenza Inj Mdck Quad With Preservative 11/01/2019  . Influenza Split 10/11/2015  . Influenza,inj,Quad PF,6+ Mos 10/13/2014  . PPD Test 10/11/2015, 04/19/2019, 05/16/2020  . Tdap 10/11/2015      Vaccinations: TD or Tdap: ***  Influenza: ***  Pneumococcal: *** Prevnar13:  Shingles: Zostavax/Shingrix: ** HPV: ***  Preventative Care: Last colonoscopy: *** Last mammogram: *** Last pap smear/pelvic exam: ***   DEXA:*** Hep C screening (9417-4081) COPD: PFT?  Imaging: Chest X-ray: EKG: ECHO:    Review of Systems:  ROS    Physical Exam: There were no vitals taken for this visit. Wt Readings from Last 3 Encounters:  05/16/20 286 lb 3.2 oz (129.8 kg)  02/07/20 278 lb (126.1 kg)  01/04/20 274 lb (124.3 kg)   General Appearance: Well nourished, in no apparent distress. Eyes: PERRLA, EOMs, conjunctiva no swelling or erythema Sinuses: No Frontal/maxillary tenderness ENT/Mouth: Ext aud canals clear, TMs without erythema, bulging. No erythema, swelling, or exudate on post pharynx.  Tonsils not swollen or erythematous. Hearing normal.  Neck: Supple, thyroid normal.  Respiratory: Respiratory effort normal, BS equal bilaterally without rales, rhonchi, wheezing or stridor.  Cardio: RRR with no MRGs. Brisk peripheral pulses without edema.  Abdomen: Soft, + BS.  Non tender, no guarding, rebound, hernias, masses. Lymphatics: Non tender without lymphadenopathy.  Musculoskeletal: Full ROM, 5/5 strength, {PSY - GAIT AND STATION:22860} gait Skin: Warm, dry without rashes, lesions, ecchymosis.  Neuro: Cranial nerves intact. No cerebellar symptoms.  Psych: Awake and oriented X 3, normal affect, Insight and Judgment appropriate.     Elder Negus, Edrick Oh, DNP [facility] 08/21/2020  8:42 AM

## 2020-08-21 ENCOUNTER — Ambulatory Visit: Payer: 59 | Admitting: Adult Health

## 2020-08-21 ENCOUNTER — Ambulatory Visit: Payer: 59 | Admitting: Adult Health Nurse Practitioner

## 2020-08-24 ENCOUNTER — Other Ambulatory Visit: Payer: Self-pay | Admitting: Internal Medicine

## 2020-08-24 DIAGNOSIS — E782 Mixed hyperlipidemia: Secondary | ICD-10-CM

## 2020-10-02 ENCOUNTER — Other Ambulatory Visit: Payer: Self-pay | Admitting: Adult Health

## 2020-10-02 ENCOUNTER — Other Ambulatory Visit: Payer: Self-pay | Admitting: Internal Medicine

## 2020-10-04 ENCOUNTER — Other Ambulatory Visit: Payer: Self-pay | Admitting: Internal Medicine

## 2020-10-04 DIAGNOSIS — F419 Anxiety disorder, unspecified: Secondary | ICD-10-CM

## 2020-11-18 ENCOUNTER — Other Ambulatory Visit: Payer: Self-pay | Admitting: Adult Health

## 2020-11-18 ENCOUNTER — Other Ambulatory Visit: Payer: Self-pay | Admitting: Internal Medicine

## 2020-11-18 DIAGNOSIS — F419 Anxiety disorder, unspecified: Secondary | ICD-10-CM

## 2020-11-27 ENCOUNTER — Ambulatory Visit: Payer: 59 | Admitting: Internal Medicine

## 2020-12-11 ENCOUNTER — Other Ambulatory Visit: Payer: Self-pay

## 2020-12-11 ENCOUNTER — Encounter: Payer: Self-pay | Admitting: Internal Medicine

## 2020-12-11 ENCOUNTER — Ambulatory Visit (INDEPENDENT_AMBULATORY_CARE_PROVIDER_SITE_OTHER): Payer: 59 | Admitting: Internal Medicine

## 2020-12-11 VITALS — BP 146/82 | HR 86 | Temp 97.2°F | Resp 16 | Ht 77.0 in | Wt 285.2 lb

## 2020-12-11 DIAGNOSIS — R7303 Prediabetes: Secondary | ICD-10-CM

## 2020-12-11 DIAGNOSIS — I10 Essential (primary) hypertension: Secondary | ICD-10-CM

## 2020-12-11 DIAGNOSIS — E782 Mixed hyperlipidemia: Secondary | ICD-10-CM

## 2020-12-11 DIAGNOSIS — Z79899 Other long term (current) drug therapy: Secondary | ICD-10-CM

## 2020-12-11 DIAGNOSIS — Z23 Encounter for immunization: Secondary | ICD-10-CM | POA: Diagnosis not present

## 2020-12-11 DIAGNOSIS — Z7252 High risk homosexual behavior: Secondary | ICD-10-CM

## 2020-12-11 DIAGNOSIS — R7309 Other abnormal glucose: Secondary | ICD-10-CM | POA: Diagnosis not present

## 2020-12-11 DIAGNOSIS — E559 Vitamin D deficiency, unspecified: Secondary | ICD-10-CM | POA: Diagnosis not present

## 2020-12-11 DIAGNOSIS — M25512 Pain in left shoulder: Secondary | ICD-10-CM

## 2020-12-11 NOTE — Progress Notes (Signed)
History of Present Illness:       This very nice 34 y.o.  Single WM  presents for 6 month follow up with HTN, HLD, Pre-Diabetes and Vitamin D Deficiency.       Patient is Right handed and  relates history of intermittent back & ankle pains. He also requests orthopedic for orthopedic referral for Left shoulder pains limiting his ROM.        Patient is treated for HTN  (2014) & BP has been controlled at home. Today's BP is borderline elevated at 146/82. Patient has had no complaints of any cardiac type chest pain, palpitations, dyspnea / orthopnea / PND, dizziness, claudication, or dependent edema.       Hyperlipidemia is controlled with diet & meds. Patient denies myalgias or other med SE's. Last Lipids were at goal except elevated Trig's:  Lab Results  Component Value Date   CHOL 137 05/16/2020   HDL 35 (L) 05/16/2020   LDLCALC 73 05/16/2020   TRIG 197 (H) 05/16/2020   CHOLHDL 3.9 05/16/2020    Also, the patient has history of PreDiabetes/Insulin Resistance (A1c 5.3%/elevated Insulin 152/2015) and has had no symptoms of reactive hypoglycemia, diabetic polys, paresthesias or visual blurring.  Last A1c was normal & at goal:  Lab Results  Component Value Date   HGBA1C 4.6 05/16/2020           Further, the patient also has history of Vitamin D Deficiency ("23"/2015 and "24" /2016) and supplements vitamin D without any suspected side-effects. Last vitamin D was still very low:  Lab Results  Component Value Date   VD25OH 34 05/16/2020    Current Outpatient Medications on File Prior to Visit  Medication Sig  . amLODipine ( 10 MG tablet Take 1/2 to 1 tablet Daily for BP  . aspirin EC 81 MG tablet Take  2  times daily.  Marland Kitchen b complex vitamins tablet Take 1 tablet  daily.  Marland Kitchen buPROPion-XL 300 MG t TAKE ONE TABLET  IN THE MORNING    . cetirizine 10 MG tablet Take 1 tablet daily. (Patient taking differently: Take 10 mg by mouth as needed. )  . cloNIDine  0.2 MG tablet Take      1 tablet     3 x /day  . famotidine 20 MG tablet Take 1 to 2 tablets 1 to 2 x /day as needed   . fish oil-omega-3 1000 MG  Take 1 g by mouth 2 (two) times daily.  Marland Kitchen FLAX SEED OIL  Take 1 tablet by mouth 2 (two) times daily.  Marland Kitchen gabapentin  100 MG capsule Take 1 capsule 3 x  /day as needed   . hydrochlorothiazide 25 MG  Take 1 tablet    Daily    . hyoscyamine SL 0.125 MG SL  Dissolve  1 to 2 tablets under tongue3 to 4 x day if needed   . lisinopril  20 MG tablet Take  1 tablet       2 x /day        for BP  . LORazepam  1 MG tablet Take  1/2 - 1 tablet 2 - 3 x /day ONLY if needed   . Magnesium 400 MG TABS Take  daily.  . meloxicam  15 MG tablet take half to one tablet  daily   . metFORMIN-XR) 500 MG  TAKE 1 TO 2 TABLETS TWICE A DAY   . propranolol  80 MG tablet Take    1 tablet    3 x /day    for BP  . rosuvastatin 20 MG tablet Take 1 tablet    Daily    for Cholesterol  . sertraline  50 MG tablet Take 1 Tablet Daily For Mood  . tamsulosin  0.4 MG CAPS  Take 1 tablet at Bedtime for Bladder Emptying  . testosterone cypio 200 MG/ML injec  inject 1.5 mLs once a week  . VITAMIN D  Take 10,000 Units daily.  Marland Kitchen zinc 50 MG tablet Take 50 mg daily.    Allergies  Allergen Reactions  . Peanut-Containing Drug Products Anaphylaxis  . Ceclor [Cefaclor] Hives and Swelling  . Sulfa Antibiotics Hives    PMHx:   Past Medical  History:  Diagnosis Date  . Anxiety   . Depression   . Hidradenitis suppurativa   . Hyperlipidemia   . Hypertension   . Prediabetes     Immunization History  Administered Date(s) Administered  . Hepatitis A, Adult 02/16/2019, 01/24/2020  . Hepatitis B, adult 02/16/2019  . Hepb-cpg 01/24/2020  . Influenza Inj Mdck Quad With Preservative 11/01/2019  . Influenza Split 10/11/2015  . Influenza,inj,Quad PF,6+ Mos 10/13/2014  . PPD Test 10/11/2015, 04/19/2019, 05/16/2020  . Tdap 10/11/2015    Past Surgical History:  Procedure Laterality Date  . None      FHx:    Reviewed / unchanged  SHx:    Reviewed / unchanged   Systems Review:  Constitutional: Denies fever, chills, wt changes, headaches, insomnia, fatigue, night sweats, change in appetite. Eyes: Denies redness, blurred vision, diplopia, discharge, itchy, watery eyes.  ENT: Denies discharge, congestion, post nasal drip, epistaxis, sore throat, earache, hearing loss, dental pain, tinnitus, vertigo, sinus pain, snoring.  CV: Denies chest pain, palpitations, irregular heartbeat, syncope, dyspnea, diaphoresis, orthopnea, PND, claudication or edema. Respiratory: denies cough, dyspnea, DOE, pleurisy, hoarseness, laryngitis, wheezing.  Gastrointestinal: Denies dysphagia, odynophagia, heartburn, reflux, water brash, abdominal pain or cramps, nausea, vomiting, bloating, diarrhea, constipation, hematemesis, melena, hematochezia  or hemorrhoids. Genitourinary: Denies dysuria, frequency, urgency, nocturia, hesitancy, discharge, hematuria or flank pain. Musculoskeletal: Denies arthralgias, myalgias, stiffness, jt. swelling, pain, limping or strain/sprain.  Skin: Denies pruritus, rash, hives, warts, acne, eczema or change in skin lesion(s). Neuro: No weakness, tremor, incoordination, spasms, paresthesia or pain. Psychiatric: Denies confusion, memory loss or sensory loss. Endo: Denies change in weight, skin or hair change.  Heme/Lymph: No  excessive bleeding, bruising or enlarged lymph nodes.  Physical Exam  BP (!) 146/82   Pulse 86   Temp (!) 97.2 F (36.2 C)   Resp 16   Ht 6\' 5"  (1.956 m)   Wt 285 lb 3.2 oz (129.4 kg)   SpO2 99%   BMI 33.82 kg/m   Appears  well nourished, well groomed  and in no distress.  Eyes: PERRLA, EOMs, conjunctiva no swelling or erythema. Sinuses: No frontal/maxillary tenderness ENT/Mouth: EAC's clear, TM's nl w/o erythema, bulging. Nares clear w/o erythema, swelling, exudates. Oropharynx  clear without erythema or exudates. Oral hygiene is good. Tongue normal, non obstructing. Hearing intact.  Neck: Supple. Thyroid not palpable. Car 2+/2+ without bruits, nodes or JVD. Chest: Respirations nl with BS clear & equal w/o rales, rhonchi, wheezing or stridor.  Cor: Heart sounds normal w/ regular rate and rhythm without sig. murmurs, gallops, clicks or rubs. Peripheral pulses normal and equal  without edema.  Abdomen: Soft & bowel sounds normal. Non-tender w/o guarding, rebound, hernias, masses or organomegaly.  Lymphatics: Unremarkable.  Musculoskeletal: Decreased  abduction & internal /external Rotation of the Left shoulder with tenderness along the anterior joint line. Normal gait.  Skin: Warm, dry without exposed rashes, lesions or ecchymosis apparent.  Neuro: Cranial nerves intact, reflexes equal bilaterally. Sensory-motor testing grossly intact. Tendon reflexes grossly intact.  Pysch: Alert & oriented x 3.  Insight and judgement nl & appropriate. No ideations.  Assessment and Plan:  1. Essential hypertension  - Continue medication, monitor blood pressure at home.  - Continue DASH diet.  Reminder to go to the ER if any CP,  SOB, nausea, dizziness, severe HA, changes vision/speech.  - CBC with Differential/Platelet - COMPLETE METABOLIC PANEL WITH GFR - Magnesium - TSH  2. Hyperlipidemia, mixed  - Continue diet/meds, exercise,& lifestyle modifications.  - Continue monitor periodic  cholesterol/liver & renal functions   - Lipid panel - TSH  3. Abnormal glucose  - Continue diet, exercise  - Lifestyle modifications.  - Monitor appropriate labs.  - Hemoglobin A1c - Insulin, random  4. Vitamin D deficiency  - Continue supplementation.  - VITAMIN D 25 Hydroxy  5. Prediabetes  - Hemoglobin A1c - Insulin, random  6. High risk homosexual behavior  - HIV Antibody (routine testing w rflx)  7. Left shoulder pain, unspecified chronicity  - Ambulatory referral to Orthopedic Surgery   8. Need for immunization against influenza  - FLU VACCINE MDCK QUAD W/Preservative  9. Medication management  - CBC with Differential/Platelet - COMPLETE METABOLIC PANEL WITH GFR - Magnesium - Lipid panel - TSH - Hemoglobin A1c - Insulin, random - VITAMIN D 25 Hydroxy         Discussed  regular exercise, BP monitoring, weight control to achieve/maintain BMI less than 25 and discussed med and SE's. Recommended labs to assess and monitor clinical status with further disposition pending results of labs.  I discussed the assessment and treatment plan with the patient. The patient was provided an opportunity to ask questions and all were answered. The patient agreed with the plan and demonstrated an understanding of the instructions.  I provided over 30 minutes of exam, counseling, chart review and  complex critical decision making.         The patient was advised to call back or seek an in-person evaluation if the symptoms worsen or if the condition fails to improve as anticipated.   Marinus Maw, MD

## 2020-12-11 NOTE — Patient Instructions (Signed)

## 2020-12-12 NOTE — Progress Notes (Signed)
========================================================== -   Test results slightly outside the reference range are not unusual. If there is anything important, I will review this with you,  otherwise it is considered normal test values.  If you have further questions,  please do not hesitate to contact me at the office or via My Chart.  ==========================================================  -  Total Chol = 110 and LDL = 56 - Both  Excellent   - Very low risk for Heart Attack  / Stroke ========================================================  - A1c - Normal - Great  - No Diabetes ! ==========================================================  - Insulin level = 171.9 is high and may be a sign of   - Insulin resistance -  which may be a sign of early diabetes and associated  with a 300 % greater risk for heart attacks, strokes, cancer &  Alzheimer type vascular dementia   - All this can be cured  and prevented with losing weight   - get Dr Francis Dowse Fuhrman's book 'the End of Diabetes" and "the End of Dieting" -   and add many years of good health to your life. ==========================================================  - Vitamin D = 73 -Excellent - Please Keep Vit D dose same ==========================================================  - HIV - Pending ==========================================================

## 2020-12-13 LAB — CBC WITH DIFFERENTIAL/PLATELET
Absolute Monocytes: 371 cells/uL (ref 200–950)
Basophils Absolute: 47 cells/uL (ref 0–200)
Basophils Relative: 0.6 %
Eosinophils Absolute: 237 cells/uL (ref 15–500)
Eosinophils Relative: 3 %
HCT: 40.6 % (ref 38.5–50.0)
Hemoglobin: 14.7 g/dL (ref 13.2–17.1)
Lymphs Abs: 2283 cells/uL (ref 850–3900)
MCH: 31.5 pg (ref 27.0–33.0)
MCHC: 36.2 g/dL — ABNORMAL HIGH (ref 32.0–36.0)
MCV: 86.9 fL (ref 80.0–100.0)
MPV: 11.2 fL (ref 7.5–12.5)
Monocytes Relative: 4.7 %
Neutro Abs: 4961 cells/uL (ref 1500–7800)
Neutrophils Relative %: 62.8 %
Platelets: 180 10*3/uL (ref 140–400)
RBC: 4.67 10*6/uL (ref 4.20–5.80)
RDW: 13 % (ref 11.0–15.0)
Total Lymphocyte: 28.9 %
WBC: 7.9 10*3/uL (ref 3.8–10.8)

## 2020-12-13 LAB — COMPLETE METABOLIC PANEL WITH GFR
AG Ratio: 2.7 (calc) — ABNORMAL HIGH (ref 1.0–2.5)
ALT: 29 U/L (ref 9–46)
AST: 20 U/L (ref 10–40)
Albumin: 4.8 g/dL (ref 3.6–5.1)
Alkaline phosphatase (APISO): 54 U/L (ref 36–130)
BUN: 17 mg/dL (ref 7–25)
CO2: 30 mmol/L (ref 20–32)
Calcium: 9.6 mg/dL (ref 8.6–10.3)
Chloride: 104 mmol/L (ref 98–110)
Creat: 0.96 mg/dL (ref 0.60–1.35)
GFR, Est African American: 120 mL/min/{1.73_m2} (ref >=60)
GFR, Est Non African American: 103 mL/min/{1.73_m2} (ref >=60)
Globulin: 1.8 g/dL (calc) — ABNORMAL LOW (ref 1.9–3.7)
Glucose, Bld: 107 mg/dL — ABNORMAL HIGH (ref 65–99)
Potassium: 3.8 mmol/L (ref 3.5–5.3)
Sodium: 142 mmol/L (ref 135–146)
Total Bilirubin: 0.5 mg/dL (ref 0.2–1.2)
Total Protein: 6.6 g/dL (ref 6.1–8.1)

## 2020-12-13 LAB — HEMOGLOBIN A1C
Hgb A1c MFr Bld: 4.9 % of total Hgb (ref <5.7)
Mean Plasma Glucose: 94 mg/dL
eAG (mmol/L): 5.2 mmol/L

## 2020-12-13 LAB — LIPID PANEL
Cholesterol: 110 mg/dL (ref <200)
HDL: 29 mg/dL — ABNORMAL LOW (ref >=40)
LDL Cholesterol (Calc): 56 mg/dL (calc)
Non-HDL Cholesterol (Calc): 81 mg/dL (calc) (ref <130)
Total CHOL/HDL Ratio: 3.8 (calc) (ref <5.0)
Triglycerides: 175 mg/dL — ABNORMAL HIGH (ref <150)

## 2020-12-13 LAB — VITAMIN D 25 HYDROXY (VIT D DEFICIENCY, FRACTURES): Vit D, 25-Hydroxy: 73 ng/mL (ref 30–100)

## 2020-12-13 LAB — TEST AUTHORIZATION

## 2020-12-13 LAB — INSULIN, RANDOM: Insulin: 171.9 u[IU]/mL — ABNORMAL HIGH

## 2020-12-13 LAB — HIV ANTIBODY (ROUTINE TESTING W REFLEX): HIV 1&2 Ab, 4th Generation: NONREACTIVE

## 2020-12-13 LAB — TSH: TSH: 0.75 mIU/L (ref 0.40–4.50)

## 2020-12-13 LAB — MAGNESIUM: Magnesium: 2.1 mg/dL (ref 1.5–2.5)

## 2020-12-24 ENCOUNTER — Other Ambulatory Visit: Payer: Self-pay | Admitting: Internal Medicine

## 2020-12-24 MED ORDER — AZITHROMYCIN 250 MG PO TABS: ORAL_TABLET | ORAL | 1 refills | Status: DC

## 2020-12-27 ENCOUNTER — Other Ambulatory Visit: Payer: Self-pay | Admitting: Internal Medicine

## 2020-12-27 DIAGNOSIS — E782 Mixed hyperlipidemia: Secondary | ICD-10-CM

## 2021-01-21 ENCOUNTER — Other Ambulatory Visit: Payer: Self-pay | Admitting: Internal Medicine

## 2021-01-21 DIAGNOSIS — F419 Anxiety disorder, unspecified: Secondary | ICD-10-CM

## 2021-02-28 ENCOUNTER — Ambulatory Visit: Payer: 59 | Admitting: Adult Health Nurse Practitioner

## 2021-02-28 ENCOUNTER — Other Ambulatory Visit: Payer: Self-pay

## 2021-02-28 ENCOUNTER — Encounter: Payer: Self-pay | Admitting: Adult Health Nurse Practitioner

## 2021-02-28 VITALS — BP 122/86 | HR 65 | Temp 98.1°F | Wt 285.0 lb

## 2021-02-28 DIAGNOSIS — K219 Gastro-esophageal reflux disease without esophagitis: Secondary | ICD-10-CM

## 2021-02-28 DIAGNOSIS — R109 Unspecified abdominal pain: Secondary | ICD-10-CM

## 2021-02-28 DIAGNOSIS — R1013 Epigastric pain: Secondary | ICD-10-CM | POA: Diagnosis not present

## 2021-02-28 LAB — CBC WITH DIFFERENTIAL/PLATELET
Absolute Monocytes: 592 cells/uL (ref 200–950)
Basophils Absolute: 64 cells/uL (ref 0–200)
Basophils Relative: 0.8 %
Eosinophils Absolute: 192 cells/uL (ref 15–500)
Eosinophils Relative: 2.4 %
HCT: 43.4 % (ref 38.5–50.0)
Hemoglobin: 15.1 g/dL (ref 13.2–17.1)
Lymphs Abs: 1992 cells/uL (ref 850–3900)
MCH: 30.6 pg (ref 27.0–33.0)
MCHC: 34.8 g/dL (ref 32.0–36.0)
MCV: 88 fL (ref 80.0–100.0)
MPV: 10.9 fL (ref 7.5–12.5)
Monocytes Relative: 7.4 %
Neutro Abs: 5160 cells/uL (ref 1500–7800)
Neutrophils Relative %: 64.5 %
Platelets: 200 10*3/uL (ref 140–400)
RBC: 4.93 10*6/uL (ref 4.20–5.80)
RDW: 12.9 % (ref 11.0–15.0)
Total Lymphocyte: 24.9 %
WBC: 8 10*3/uL (ref 3.8–10.8)

## 2021-02-28 LAB — COMPLETE METABOLIC PANEL WITH GFR
AG Ratio: 2.6 (calc) — ABNORMAL HIGH (ref 1.0–2.5)
ALT: 35 U/L (ref 9–46)
AST: 21 U/L (ref 10–40)
Albumin: 4.9 g/dL (ref 3.6–5.1)
Alkaline phosphatase (APISO): 51 U/L (ref 36–130)
BUN: 13 mg/dL (ref 7–25)
CO2: 27 mmol/L (ref 20–32)
Calcium: 9.5 mg/dL (ref 8.6–10.3)
Chloride: 103 mmol/L (ref 98–110)
Creat: 0.91 mg/dL (ref 0.60–1.35)
GFR, Est African American: 128 mL/min/{1.73_m2} (ref >=60)
GFR, Est Non African American: 110 mL/min/{1.73_m2} (ref >=60)
Globulin: 1.9 g/dL (calc) (ref 1.9–3.7)
Glucose, Bld: 97 mg/dL (ref 65–99)
Potassium: 4.5 mmol/L (ref 3.5–5.3)
Sodium: 138 mmol/L (ref 135–146)
Total Bilirubin: 0.4 mg/dL (ref 0.2–1.2)
Total Protein: 6.8 g/dL (ref 6.1–8.1)

## 2021-02-28 MED ORDER — SUCRALFATE 1 G PO TABS: 1.0000 g | ORAL_TABLET | Freq: Four times a day (QID) | ORAL | 1 refills | Status: DC

## 2021-02-28 MED ORDER — OMEPRAZOLE 20 MG PO CPDR: 20.0000 mg | DELAYED_RELEASE_CAPSULE | Freq: Two times a day (BID) | ORAL | 1 refills | Status: DC

## 2021-02-28 MED ORDER — OMEPRAZOLE 40 MG PO CPDR: 40.0000 mg | DELAYED_RELEASE_CAPSULE | Freq: Every day | ORAL | 0 refills | Status: DC

## 2021-02-28 NOTE — Addendum Note (Signed)
Addended byElder Negus A on: 02/28/2021 03:11 PM   Modules accepted: Orders

## 2021-02-28 NOTE — Progress Notes (Signed)
Assessment and Plan:  Spencer Love was seen today for acute visit and abdominal pain.  Diagnoses and all orders for this visit:  Abdominal pain, unspecified abdominal location -     sucralfate (CARAFATE) 1 g tablet; Take 1 tablet (1 g total) by mouth 4 (four) times daily. -     CBC with Differential/Platelet -     COMPLETE METABOLIC PANEL WITH GFR  Gastroesophageal reflux disease, unspecified whether esophagitis present Abdominal pain, epigastric PUD, likely related to binge drinking with excessive acidic intake, lemons -     omeprazole (PRILOSEC) 40 MG capsule; Take 1 capsule (40 mg total) by mouth daily. Consider ultrasound vs EGD is not improvement.   Contact office with any new or worsening symptoms.  Further disposition pending results of labs. Discussed med's effects and SE's.   Over 30 minutes of face to face interview, exam, counseling, chart review, and critical decision making was performed.   Future Appointments  Date Time Provider Department Center  03/20/2021  8:45 AM Elder Negus, NP [facility] None  07/23/2021  3:00 PM Lucky Cowboy, MD [facility] None    ------------------------------------------------------------------------------------------------------------------   HPI 35 y.o.male presents for evaluation for stomach.  Reports he went out with his friends and had 5 gin & tonics.  He reports he was vomiting.  He has continued to feel nauseated.  He took off work and has continued to feel bad.  He has been drinking gingerAle and able to keep it down.  He reports that he is having .  Reports he is able to keep down chicken noodle soup and chicken and rice.  He reports eating and laying flat makes it worse.  Nothing makes it better.  Did take pepto that did not help.      Past Medical History:  Diagnosis Date  . Anxiety   . Depression   . Hidradenitis suppurativa   . Hyperlipidemia   . Hypertension   . Prediabetes      Allergies  Allergen Reactions   . Peanut-Containing Drug Products Anaphylaxis  . Ceclor [Cefaclor] Hives and Swelling  . Sulfa Antibiotics Hives    Current Outpatient Medications on File Prior to Visit  Medication Sig  . amLODipine (NORVASC) 10 MG tablet Take 1/2 to 1 tablet Daily for BP  . aspirin EC 81 MG tablet Take 81 mg by mouth 2 (two) times daily.  . cetirizine (ZYRTEC) 10 MG tablet Take 1 tablet (10 mg total) by mouth daily. (Patient taking differently: Take 10 mg by mouth as needed.)  . cloNIDine (CATAPRES) 0.2 MG tablet Take     1 tablet     3 x /day       for BP  . famotidine (KLS ACID CONTROLLER MAX ST) 20 MG tablet Take 1 to 2 tablets 1 to 2 x /day as needed for Indigestion & Heartburn  . fish oil-omega-3 fatty acids 1000 MG capsule Take 1 g by mouth 2 (two) times daily.  . Flaxseed, Linseed, (FLAX SEED OIL PO) Take 1 tablet by mouth 2 (two) times daily.  Marland Kitchen gabapentin (NEURONTIN) 100 MG capsule Take 1 capsule 3 x  /day as needed for Pain  . hydrochlorothiazide (HYDRODIURIL) 25 MG tablet Take 1 tablet    Daily   For BP  & Fluid Retention / Ankle Swelling  . hyoscyamine (LEVSIN SL) 0.125 MG SL tablet Dissolve  1 to 2 tablets under tongue3 to 4 x day if needed for Nausea, Vomiting, Cramping, Bloating  or Diarrhea  . lisinopril (  ZESTRIL) 20 MG tablet Take      1 tablet       2 x /day        for BP  . LORazepam (ATIVAN) 1 MG tablet Take   1/2 - 1 tablet   2 - 3 x /day   ONLY   if needed for Anxiety Attack &  limit to 5 days /week to avoid Addiction & Dementia  . Magnesium 400 MG TABS Take 400 mg by mouth daily.  . meloxicam (MOBIC) 15 MG tablet take half to one tablet by mouth daily with food for pain and inflammation **limit to 5 days/week to avoid kidney damage**  . metFORMIN (GLUCOPHAGE-XR) 500 MG 24 hr tablet TAKE 1 TO 2 TABLETS BY MOUTH TWICE A DAY FOR DIABETES  . propranolol (INDERAL) 80 MG tablet Take   1 tablet   3 x/day   for BP  . rosuvastatin (CRESTOR) 20 MG tablet TAKE ONE TABLET BY MOUTH ONE TIME DAILY  for cholesterol  . VITAMIN D PO Take 10,000 Units by mouth daily.  Marland Kitchen zinc gluconate 50 MG tablet Take 50 mg by mouth daily.   No current facility-administered medications on file prior to visit.    ROS: all negative except above.   Physical Exam:  BP 122/86   Pulse 65   Temp 98.1 F (36.7 C)   Wt 285 lb (129.3 kg)   SpO2 99%   BMI 33.80 kg/m   General Appearance: Well nourished, in no apparent distress. Eyes: PERRLA, EOMs, conjunctiva no swelling or erythema Sinuses: No Frontal/maxillary tenderness ENT/Mouth: Ext aud canals clear, TMs without erythema, bulging. No erythema, swelling, or exudate on post pharynx.  Tonsils not swollen or erythematous. Hearing normal.  Neck: Supple, thyroid normal.  Respiratory: Respiratory effort normal, BS equal bilaterally without rales, rhonchi, wheezing or stridor.  Cardio: RRR with no MRGs. Brisk peripheral pulses without edema.  Abdomen: Soft, + BS, hyperactive.  Non tender, no guarding, rebound, hernias, masses. Lymphatics: Non tender without lymphadenopathy.  Musculoskeletal: Full ROM, 5/5 strength, normal gait.  Skin: Warm, dry without rashes, lesions, ecchymosis.  Neuro: Cranial nerves intact. Normal muscle tone, no cerebellar symptoms. Sensation intact.  Psych: Awake and oriented X 3, normal affect, Insight and Judgment appropriate.      Elder Negus, Edrick Oh, DNP [facility] 02/28/2021  2:18 PM

## 2021-03-19 NOTE — Progress Notes (Deleted)
Assessment and Plan:  Spencer Love was seen today for acute visit and abdominal pain.  Diagnoses and all orders for this visit:  Abdominal pain, unspecified abdominal location -     sucralfate (CARAFATE) 1 g tablet; Take 1 tablet (1 g total) by mouth 4 (four) times daily. -     CBC with Differential/Platelet -     COMPLETE METABOLIC PANEL WITH GFR  Gastroesophageal reflux disease, unspecified whether esophagitis present Abdominal pain, epigastric PUD, likely related to binge drinking with excessive acidic intake, lemons -     omeprazole (PRILOSEC) 40 MG capsule; Take 1 capsule (40 mg total) by mouth daily. Consider ultrasound vs EGD is not improvement.   Contact office with any new or worsening symptoms.  Further disposition pending results of labs. Discussed med's effects and SE's.   Over 30 minutes of face to face interview, exam, counseling, chart review, and critical decision making was performed.   Future Appointments  Date Time Provider Department Center  03/20/2021  8:45 AM Elder Negus, NP [facility] None  07/23/2021  3:00 PM Lucky Cowboy, MD [facility] None    ------------------------------------------------------------------------------------------------------------------   HPI 35 y.o.male presents for 47month follow up on HTN, HLD, pre-diabetes and vitamin D deficiency.   evaluation for stomach.  Reports he went out with his friends and had 5 gin & tonics.  He reports he was vomiting.  He has continued to feel nauseated.  He took off work and has continued to feel bad.  He has been drinking gingerAle and able to keep it down.  He reports that he is having .  Reports he is able to keep down chicken noodle soup and chicken and rice.  He reports eating and laying flat makes it worse.  Nothing makes it better.  Did take pepto that did not help.   There were no vitals filed for this visit.    He is treated for HTN since 2014 and controlled today and at home.  He  has not had any cardiac chest pains, palpatations, dyspnea, orthopnea, PND, dizziness, claudication, or dependent edema.  He  {ACTION; IS/IS HYI:50277412}  on cholesterol medication: {cholesterol meds:21888} His cholesterol {ACTION; IS/IS NOT:21021397} at goal, {cholesterol goal:23065}  Patient is not taking any medications for this.  The cholesterol last visit was not to goal lat check Lab Results  Component Value Date   CHOL 110 12/11/2020   HDL 29 (L) 12/11/2020   LDLCALC 56 12/11/2020   TRIG 175 (H) 12/11/2020   CHOLHDL 3.8 12/11/2020    He {Has/has not:18111} been working on diet and exercise for Diabetes {with or without complications:30421263}, he {ACTION; IS/IS NOT:21021397} on bASA, he {ACTION; IS/IS NOT:21021397} on ACE/ARB, and denies {Symptoms; diabetes w/o none:19199}. Last A1C was:  Lab Results  Component Value Date   HGBA1C 4.9 12/11/2020     Lab Results  Component Value Date   GFRNONAA 110 02/28/2021     Patient {ACTION; IS/IS INO:67672094} on Vitamin D3 supplement for defciency.  Last result {WAS/WAS NOT:[phone]::"was not"} to goal.  Lab Results  Component Value Date   VD25OH 73 12/11/2020     Past Medical History:  Diagnosis Date  . Anxiety   . Depression   . Hidradenitis suppurativa   . Hyperlipidemia   . Hypertension   . Prediabetes      Allergies  Allergen Reactions  . Peanut-Containing Drug Products Anaphylaxis  . Ceclor [Cefaclor] Hives and Swelling  . Sulfa Antibiotics Hives    Current Outpatient Medications on File Prior  to Visit  Medication Sig  . amLODipine (NORVASC) 10 MG tablet Take 1/2 to 1 tablet Daily for BP  . aspirin EC 81 MG tablet Take 81 mg by mouth 2 (two) times daily.  . cetirizine (ZYRTEC) 10 MG tablet Take 1 tablet (10 mg total) by mouth daily. (Patient taking differently: Take 10 mg by mouth as needed.)  . cloNIDine (CATAPRES) 0.2 MG tablet Take     1 tablet     3 x /day       for BP  . famotidine (KLS ACID  CONTROLLER MAX ST) 20 MG tablet Take 1 to 2 tablets 1 to 2 x /day as needed for Indigestion & Heartburn  . fish oil-omega-3 fatty acids 1000 MG capsule Take 1 g by mouth 2 (two) times daily.  . Flaxseed, Linseed, (FLAX SEED OIL PO) Take 1 tablet by mouth 2 (two) times daily.  Marland Kitchen gabapentin (NEURONTIN) 100 MG capsule Take 1 capsule 3 x  /day as needed for Pain  . hydrochlorothiazide (HYDRODIURIL) 25 MG tablet Take 1 tablet    Daily   For BP  & Fluid Retention / Ankle Swelling  . hyoscyamine (LEVSIN SL) 0.125 MG SL tablet Dissolve  1 to 2 tablets under tongue3 to 4 x day if needed for Nausea, Vomiting, Cramping, Bloating  or Diarrhea  . lisinopril (ZESTRIL) 20 MG tablet Take      1 tablet       2 x /day        for BP  . LORazepam (ATIVAN) 1 MG tablet Take   1/2 - 1 tablet   2 - 3 x /day   ONLY   if needed for Anxiety Attack &  limit to 5 days /week to avoid Addiction & Dementia  . Magnesium 400 MG TABS Take 400 mg by mouth daily.  . meloxicam (MOBIC) 15 MG tablet take half to one tablet by mouth daily with food for pain and inflammation **limit to 5 days/week to avoid kidney damage**  . metFORMIN (GLUCOPHAGE-XR) 500 MG 24 hr tablet TAKE 1 TO 2 TABLETS BY MOUTH TWICE A DAY FOR DIABETES  . omeprazole (PRILOSEC) 20 MG capsule Take 1 capsule (20 mg total) by mouth 2 (two) times daily.  . propranolol (INDERAL) 80 MG tablet Take   1 tablet   3 x/day   for BP  . rosuvastatin (CRESTOR) 20 MG tablet TAKE ONE TABLET BY MOUTH ONE TIME DAILY for cholesterol  . sucralfate (CARAFATE) 1 g tablet Take 1 tablet (1 g total) by mouth 4 (four) times daily.  Marland Kitchen VITAMIN D PO Take 10,000 Units by mouth daily.  Marland Kitchen zinc gluconate 50 MG tablet Take 50 mg by mouth daily.   No current facility-administered medications on file prior to visit.   Allergies Allergies  Allergen Reactions  . Peanut-Containing Drug Products Anaphylaxis  . Ceclor [Cefaclor] Hives and Swelling  . Sulfa Antibiotics Hives    SURGICAL HISTORY He   has a past surgical history that includes None.   FAMILY HISTORY His family history includes Breast cancer in his paternal grandmother; Colitis in his sister; Colon cancer in his paternal aunt; Diabetes in his paternal grandmother and sister; Esophageal cancer in his paternal grandfather; Heart disease in his father, maternal grandmother, mother, and sister.   SOCIAL HISTORY He  reports that he has been smoking cigarettes. He has a 6.00 pack-year smoking history. He has never used smokeless tobacco. He reports current alcohol use. He reports current drug use.  Drug: Marijuana.   ROS: all negative except above.   Physical Exam:  There were no vitals taken for this visit.  General Appearance: Well nourished, in no apparent distress. Eyes: PERRLA, EOMs, conjunctiva no swelling or erythema Sinuses: No Frontal/maxillary tenderness ENT/Mouth: Ext aud canals clear, TMs without erythema, bulging. No erythema, swelling, or exudate on post pharynx.  Tonsils not swollen or erythematous. Hearing normal.  Neck: Supple, thyroid normal.  Respiratory: Respiratory effort normal, BS equal bilaterally without rales, rhonchi, wheezing or stridor.  Cardio: RRR with no MRGs. Brisk peripheral pulses without edema.  Abdomen: Soft, + BS, hyperactive.  Non tender, no guarding, rebound, hernias, masses. Lymphatics: Non tender without lymphadenopathy.  Musculoskeletal: Full ROM, 5/5 strength, normal gait.  Skin: Warm, dry without rashes, lesions, ecchymosis.  Neuro: Cranial nerves intact. Normal muscle tone, no cerebellar symptoms. Sensation intact.  Psych: Awake and oriented X 3, normal affect, Insight and Judgment appropriate.      Elder Negus, Edrick Oh, DNP [facility] 03/19/2021  10:16 PM

## 2021-03-20 ENCOUNTER — Other Ambulatory Visit: Payer: Self-pay | Admitting: Adult Health Nurse Practitioner

## 2021-03-20 ENCOUNTER — Ambulatory Visit: Payer: 59 | Admitting: Adult Health Nurse Practitioner

## 2021-03-20 ENCOUNTER — Other Ambulatory Visit: Payer: Self-pay | Admitting: Internal Medicine

## 2021-03-20 DIAGNOSIS — F419 Anxiety disorder, unspecified: Secondary | ICD-10-CM

## 2021-03-20 DIAGNOSIS — E782 Mixed hyperlipidemia: Secondary | ICD-10-CM

## 2021-03-21 ENCOUNTER — Other Ambulatory Visit: Payer: Self-pay

## 2021-03-21 DIAGNOSIS — E782 Mixed hyperlipidemia: Secondary | ICD-10-CM

## 2021-03-21 MED ORDER — ROSUVASTATIN CALCIUM 20 MG PO TABS: ORAL_TABLET | ORAL | 0 refills | Status: DC

## 2021-03-21 MED ORDER — CLONIDINE HCL 0.2 MG PO TABS: ORAL_TABLET | ORAL | 0 refills | Status: DC

## 2021-03-21 MED ORDER — HYDROCHLOROTHIAZIDE 25 MG PO TABS: ORAL_TABLET | ORAL | 0 refills | Status: DC

## 2021-03-21 NOTE — Telephone Encounter (Signed)
REFILL on Rosuvastatin,clonidine, & hctz

## 2021-03-25 ENCOUNTER — Telehealth: Payer: Self-pay

## 2021-03-25 NOTE — Telephone Encounter (Signed)
Message was sen to patient to inform of appt need for med follow up & MED refill

## 2021-03-25 NOTE — Telephone Encounter (Signed)
-----   Message from Elder Negus, NP sent at 03/21/2021  5:04 PM EDT ----- Regarding: RE: med request front office message Contact: [phone] He did not show for his appointment.  That is the reason we denies refills.   Sincerely,           Elder Negus, NP   ----- Message ----- From: Gregery Na, CMA Sent: 03/21/2021   4:20 PM EDT To: Elder Negus, NP Subject: med request front office message               Refill: [email or web address]

## 2021-04-04 ENCOUNTER — Other Ambulatory Visit: Payer: Self-pay | Admitting: Internal Medicine

## 2021-04-04 DIAGNOSIS — I10 Essential (primary) hypertension: Secondary | ICD-10-CM

## 2021-04-05 ENCOUNTER — Other Ambulatory Visit: Payer: Self-pay | Admitting: Internal Medicine

## 2021-04-05 DIAGNOSIS — F419 Anxiety disorder, unspecified: Secondary | ICD-10-CM

## 2021-04-05 MED ORDER — LORAZEPAM 1 MG PO TABS: ORAL_TABLET | ORAL | 0 refills | Status: DC

## 2021-04-20 ENCOUNTER — Other Ambulatory Visit: Payer: Self-pay | Admitting: Internal Medicine

## 2021-04-20 MED ORDER — DEXAMETHASONE 4 MG PO TABS: ORAL_TABLET | ORAL | 0 refills | Status: DC

## 2021-04-22 ENCOUNTER — Other Ambulatory Visit: Payer: Self-pay | Admitting: Internal Medicine

## 2021-04-22 MED ORDER — LISINOPRIL 20 MG PO TABS: ORAL_TABLET | ORAL | 3 refills | Status: DC

## 2021-04-26 ENCOUNTER — Other Ambulatory Visit: Payer: Self-pay | Admitting: Internal Medicine

## 2021-04-26 MED ORDER — AZITHROMYCIN 250 MG PO TABS: ORAL_TABLET | ORAL | 1 refills | Status: DC

## 2021-05-03 ENCOUNTER — Telehealth: Payer: Self-pay | Admitting: *Deleted

## 2021-05-03 NOTE — Telephone Encounter (Signed)
Patient called and reported, since positive Covid on 04/20/2021, he is super fatigued, feels foggy ad tried to return to work 05/02/2021. States he tried to go to work this morning, but was not able to stay. Per Dr Oneta Rack, be sure to take Vitamin C 1000 mg and Zinc 50 mg daily. Patient advised to move around about 10 minutes out of every hour. Dr Oneta Rack approved a out of work note from 05/03/2021 through 05/10/2021.

## 2021-05-06 ENCOUNTER — Other Ambulatory Visit: Payer: Self-pay | Admitting: Adult Health

## 2021-05-09 ENCOUNTER — Other Ambulatory Visit: Payer: Self-pay

## 2021-05-09 ENCOUNTER — Ambulatory Visit: Payer: 59 | Admitting: Internal Medicine

## 2021-05-09 VITALS — BP 122/80 | HR 87 | Temp 97.3°F | Resp 16 | Ht 77.0 in | Wt 289.4 lb

## 2021-05-09 DIAGNOSIS — R7309 Other abnormal glucose: Secondary | ICD-10-CM

## 2021-05-09 DIAGNOSIS — Z79899 Other long term (current) drug therapy: Secondary | ICD-10-CM | POA: Diagnosis not present

## 2021-05-09 DIAGNOSIS — R5383 Other fatigue: Secondary | ICD-10-CM

## 2021-05-09 DIAGNOSIS — U099 Post covid-19 condition, unspecified: Secondary | ICD-10-CM

## 2021-05-09 NOTE — Progress Notes (Signed)
Future Appointments  Date Time Provider Department Center  07/23/2021  3:00 PM Lucky Cowboy, MD [facility] None    History of Present Illness:     Patient is a very nice 35 yo WM who tested (+) for Covid 04/20/21 and returned to work 05/02/21 for 1 day . Then on 05/06,  he tried working, but left due to severe fatigue. Thereafter , he is given a note to stay OOW from 05/05  - 05/13. And allowed to try return to work on 05/14.    Medications  Current Outpatient Medications (Endocrine & Metabolic):  .  dexamethasone (DECADRON) 4 MG tablet, Take 1 tab 3 x day - 3 days, then 2 x day - 3 days, then 1 tab daily .  metFORMIN (GLUCOPHAGE-XR) 500 MG 24 hr tablet, Take 1 to 2 tablets by mouth twice a day for diabetes  Current Outpatient Medications (Cardiovascular):  .  amLODipine (NORVASC) 10 MG tablet, TAKE HALF TO ONE TABLET BY MOUTH DAILY FOR BLOOD PRESSURE .  cloNIDine (CATAPRES) 0.2 MG tablet, Take     1 tablet     3 x /day       for BP .  hydrochlorothiazide (HYDRODIURIL) 25 MG tablet, Take 1 tablet    Daily   For BP  & Fluid Retention / Ankle Swelling .  lisinopril (ZESTRIL) 20 MG tablet, Take  1 tablet  2 x /day  for BP .  propranolol (INDERAL) 80 MG tablet, Take   1 tablet   3 x/day   for BP .  rosuvastatin (CRESTOR) 20 MG tablet, TAKE ONE TABLET BY MOUTH ONE TIME DAILY for cholesterol  Current Outpatient Medications (Respiratory):  .  cetirizine (ZYRTEC) 10 MG tablet, Take 1 tablet (10 mg total) by mouth daily. (Patient taking differently: Take 10 mg by mouth as needed.)  Current Outpatient Medications (Analgesics):  .  aspirin EC 81 MG tablet, Take 81 mg by mouth 2 (two) times daily. .  meloxicam (MOBIC) 15 MG tablet, take half to one tablet by mouth daily with food for pain and inflammation **limit to 5 days/week to avoid kidney damage**   Current Outpatient Medications (Other):  Marland Kitchen  Ascorbic Acid (VITAMIN C) 1000 MG tablet, Take 1,000 mg by mouth daily. .  famotidine  (KLS ACID CONTROLLER MAX ST) 20 MG tablet, Take 1 to 2 tablets 1 to 2 x /day as needed for Indigestion & Heartburn .  fish oil-omega-3 fatty acids 1000 MG capsule, Take 1 g by mouth 2 (two) times daily. Marland Kitchen  gabapentin (NEURONTIN) 100 MG capsule, TAKE ONE CAPSULE BY MOUTH THREE TIMES DAILY AS NEEDED FOR PAIN .  hyoscyamine (LEVSIN SL) 0.125 MG SL tablet, Dissolve  1 to 2 tablets under tongue3 to 4 x day if needed for Nausea, Vomiting, Cramping, Bloating  or Diarrhea .  LORazepam (ATIVAN) 1 MG tablet, Take  1/2 - 1 tablet  2 - 3 x /day  ONLY  if needed for Anxiety Attack &  limit to 5 days /week to avoid Addiction & Dementia .  Magnesium 400 MG TABS, Take 400 mg by mouth daily. Marland Kitchen  omeprazole (PRILOSEC) 20 MG capsule, Take 1 capsule (20 mg total) by mouth 2 (two) times daily. .  sucralfate (CARAFATE) 1 g tablet, Take 1 tablet (1 g total) by mouth 4 (four) times daily. Marland Kitchen  VITAMIN D PO, Take 10,000 Units by mouth daily. Marland Kitchen  zinc gluconate 50 MG tablet, Take 50 mg by mouth daily.  Problem  list He has Essential hypertension; Depression, controlled; OSA (obstructive sleep apnea); Mixed hyperlipidemia; Medication management; Vitamin D deficiency; Genital warts; GERD; Obesity (BMI 30.0-34.9); Testosterone deficiency; and Insulin resistance on their problem list.   Observations/Objective:  BP 122/80   Pulse 87   Temp (!) 97.3 F (36.3 C)   Resp 16   Ht 6\' 5"  (1.956 m)   Wt 289 lb 6.4 oz (131.3 kg)   SpO2 98%   BMI 34.32 kg/m   HEENT - WNL. Neck - supple.  Chest - Clear equal BS. Cor - Nl HS. RRR w/o sig MGR. PP 1(+). No edema. MS- FROM w/o deformities.  Gait Nl. Neuro -  Nl w/o focal abnormalities.  Assessment and Plan:  1. Post-COVID syndrome  - CBC with Differential/Platelet - Sedimentation rate - C-reactive protein  2. Fatigue  - CBC with Differential/Platelet - COMPLETE METABOLIC PANEL WITH GFR - TSH - Vitamin B12  3. Abnormal glucose  - Hemoglobin A1c  4. Medication  management  - CBC with Differential/Platelet - COMPLETE METABOLIC PANEL WITH GFR - TSH - Hemoglobin A1c - Vitamin B12   Follow Up Instructions:       I discussed the assessment and treatment plan with the patient. The patient was provided an opportunity to ask questions and all were answered. The patient agreed with the plan and demonstrated an understanding of the instructions.         The patient was advised to call back or seek an in-person evaluation if the symptoms worsen or if the condition fails to improve as anticipated.   , MD

## 2021-05-10 LAB — COMPLETE METABOLIC PANEL WITH GFR
AG Ratio: 2.6 (calc) — ABNORMAL HIGH (ref 1.0–2.5)
ALT: 47 U/L — ABNORMAL HIGH (ref 9–46)
AST: 25 U/L (ref 10–40)
Albumin: 4.4 g/dL (ref 3.6–5.1)
Alkaline phosphatase (APISO): 45 U/L (ref 36–130)
BUN: 14 mg/dL (ref 7–25)
CO2: 29 mmol/L (ref 20–32)
Calcium: 9.5 mg/dL (ref 8.6–10.3)
Chloride: 102 mmol/L (ref 98–110)
Creat: 0.86 mg/dL (ref 0.60–1.35)
GFR, Est African American: 132 mL/min/{1.73_m2} (ref >=60)
GFR, Est Non African American: 114 mL/min/{1.73_m2} (ref >=60)
Globulin: 1.7 g/dL (calc) — ABNORMAL LOW (ref 1.9–3.7)
Glucose, Bld: 114 mg/dL — ABNORMAL HIGH (ref 65–99)
Potassium: 4.7 mmol/L (ref 3.5–5.3)
Sodium: 138 mmol/L (ref 135–146)
Total Bilirubin: 0.4 mg/dL (ref 0.2–1.2)
Total Protein: 6.1 g/dL (ref 6.1–8.1)

## 2021-05-10 LAB — CBC WITH DIFFERENTIAL/PLATELET
Absolute Monocytes: 465 cells/uL (ref 200–950)
Basophils Absolute: 37 cells/uL (ref 0–200)
Basophils Relative: 0.6 %
Eosinophils Absolute: 180 cells/uL (ref 15–500)
Eosinophils Relative: 2.9 %
HCT: 39.6 % (ref 38.5–50.0)
Hemoglobin: 14 g/dL (ref 13.2–17.1)
Lymphs Abs: 1798 cells/uL (ref 850–3900)
MCH: 31.7 pg (ref 27.0–33.0)
MCHC: 35.4 g/dL (ref 32.0–36.0)
MCV: 89.8 fL (ref 80.0–100.0)
MPV: 11.4 fL (ref 7.5–12.5)
Monocytes Relative: 7.5 %
Neutro Abs: 3720 cells/uL (ref 1500–7800)
Neutrophils Relative %: 60 %
Platelets: 158 10*3/uL (ref 140–400)
RBC: 4.41 10*6/uL (ref 4.20–5.80)
RDW: 13.1 % (ref 11.0–15.0)
Total Lymphocyte: 29 %
WBC: 6.2 10*3/uL (ref 3.8–10.8)

## 2021-05-10 LAB — TSH: TSH: 0.96 mIU/L (ref 0.40–4.50)

## 2021-05-10 LAB — C-REACTIVE PROTEIN: CRP: 2.4 mg/L (ref <8.0)

## 2021-05-10 LAB — HEMOGLOBIN A1C
Hgb A1c MFr Bld: 5.3 % of total Hgb (ref <5.7)
Mean Plasma Glucose: 105 mg/dL
eAG (mmol/L): 5.8 mmol/L

## 2021-05-10 LAB — SEDIMENTATION RATE: Sed Rate: 6 mm/h (ref 0–15)

## 2021-05-10 LAB — VITAMIN B12: Vitamin B-12: 493 pg/mL (ref 200–1100)

## 2021-05-11 NOTE — Progress Notes (Signed)
============================================================ -   Test results slightly outside the reference range are not unusual. If there is anything important, I will review this with you,  otherwise it is considered normal test values.  If you have further questions,  please do not hesitate to contact me at the office or via My Chart.  ============================================================ ============================================================  -  A1c - Normal - Great - No Diabetes  ! ============================================================ ============================================================  -  Vitamin B12 is Normal  /OK  ============================================================ ============================================================  -  Sed rate & CRP to monitor for Inflammation are both Normal & OK ============================================================ ============================================================  - All Else - CBC - Kidneys - Electrolytes - Liver - Magnesium & Thyroid    - all  Normal / OK =============================================================  ============================================================= =============================================================

## 2021-05-12 ENCOUNTER — Encounter: Payer: Self-pay | Admitting: Internal Medicine

## 2021-05-14 ENCOUNTER — Other Ambulatory Visit: Payer: Self-pay | Admitting: Internal Medicine

## 2021-05-14 DIAGNOSIS — F419 Anxiety disorder, unspecified: Secondary | ICD-10-CM

## 2021-05-17 ENCOUNTER — Encounter: Payer: Managed Care, Other (non HMO) | Admitting: Internal Medicine

## 2021-05-20 ENCOUNTER — Encounter: Payer: Self-pay | Admitting: Internal Medicine

## 2021-05-20 ENCOUNTER — Ambulatory Visit: Payer: 59 | Admitting: Internal Medicine

## 2021-05-20 ENCOUNTER — Other Ambulatory Visit: Payer: Self-pay

## 2021-05-20 VITALS — BP 108/52 | HR 79 | Temp 97.2°F | Resp 16 | Ht 77.0 in | Wt 289.9 lb

## 2021-05-20 DIAGNOSIS — U099 Post covid-19 condition, unspecified: Secondary | ICD-10-CM | POA: Diagnosis not present

## 2021-05-20 NOTE — Progress Notes (Signed)
Future Appointments  Date Time Provider Department Center  05/20/2021 10:30 AM Lucky Cowboy, MD [facility] None  07/23/2021  3:00 PM Lucky Cowboy, MD [facility] None    History of Present Illness:    Patient is a nice 35 yo MM tested (+) for Covid on 04/20/2021 and was out of work til returning 05/02/2021. On 05/06 he left work due to profound fatigue. On 05/12 he  was given thru yesterday (05/20/2021) and is scheduled to return to work tomorrow. He reports some fatigability, but feels that he is able to return to work tomorrow. Denies any fever, chill, rashes, respiratory sx's or joint/muscle pains.  a work leave extension   Medications  .  metFORMIN -XR) 500 MG , Take 1 to 2 tablets  twice a day for diabetes .  amLODipine  10 MG tablet, TAKE 1/2 -1 tab  DAILY  .  cloNIDine  0.2 MG tablet, Take     1 tablet     3 x /day       for BP .  Hydrochlorothiazide 25 MG tablet, Take 1 tablet Daily .  lisinopril  20 MG tablet, Take  1 tablet  2 x /day  for BP .  Propranolol 80 MG tablet, Take   1 tablet   3 x/day   for BP .  rosuvastatin 20 MG tablet, TAKE ONE TABLET DAILYl\ .  cetirizine  10 MG tablet, Take 1 tablet daily - as needed .  aspirin EC 81 MG tablet, Take  2 times daily. .  meloxicam  15 MG tablet, take half to one tablet  daily with food for pain and inflammation  .  VITAMIN C 1000 MG tablet, Take  daily. .  Famotidine  20 MG tablet, Take 1 to 2 tablets 1 to 2 x /day  .  fish oil-omega-3 1000 MG capsule, Take 1 g \\2  (two) times daily. Marland Kitchen  gabapentin \\100  MG capsule, TAKE ONE CAPSULE  THREE TIMES DAILY AS NEEDED FOR PAIN .  hyoscyamine (LEVSIN SL) 0.125 MG SL tablet, Dissolve  1 to 2 tablets under tongue 3 to 4 x day if needed for Nausea, Vomiting, Cramping, Bloating  or Diarrhea .  LORazepam (ATIVAN) 1 MG tablet, take 1/2-1 tablet  2 to 3 x /day  if needed for Anxiety Attack *limit to 5 days per week to avoid addiction and dementia .  Magnesium 400 MG TABS, Take 400 mg by  mouth daily. Marland Kitchen  omeprazole (PRILOSEC) 20 MG capsule, Take 1 capsule (20 mg total) by mouth 2 (two) times daily. .  sucralfate (CARAFATE) 1 g tablet, Take 1 tablet (1 g total) by mouth 4 (four) times daily. Marland Kitchen  VITAMIN D PO, Take 10,000 Units by mouth daily. Marland Kitchen  zinc gluconate 50 MG tablet, Take 50 mg by mouth daily.  Problem list He has Essential hypertension; Depression, controlled; OSA (obstructive sleep apnea); Mixed hyperlipidemia; Medication management; Vitamin D deficiency; Genital warts; GERD; Obesity (BMI 30.0-34.9); Testosterone deficiency; and Insulin resistance on their problem list.   Observations/Objective:  BP (!) 108/52   Pulse 79   Temp (!) 97.2 F (36.2 C)   Resp 16   Ht 6\' 5"  (1.956 m)   Wt 289 lb 14.4 oz (131.5 kg)   SpO2 99%   BMI 34.38 kg/m   HEENT - WNL. Neck - supple.  Chest - Clear equal BS. Cor - Nl HS. RRR w/o sig MGR. PP 1(+). No edema. MS- FROM w/o deformities.  Gait Nl.  Neuro -  Nl w/o focal abnormalities.  Assessment and Plan:  1. Post-COVID syndrome  - Advised may return to work tomorrow  (05/21/2021)    Follow Up Instructions:        I discussed the assessment and treatment plan with the patient. The patient was provided an opportunity to ask questions and all were answered. The patient agreed with the plan and demonstrated an understanding of the instructions.       The patient was advised to call back or seek an in-person evaluation if the symptoms worsen or if the condition fails to improve as anticipated.   Marinus Maw, MD

## 2021-06-21 ENCOUNTER — Other Ambulatory Visit: Payer: Self-pay | Admitting: Adult Health Nurse Practitioner

## 2021-06-21 ENCOUNTER — Other Ambulatory Visit: Payer: Self-pay | Admitting: Internal Medicine

## 2021-06-21 DIAGNOSIS — E782 Mixed hyperlipidemia: Secondary | ICD-10-CM

## 2021-07-10 ENCOUNTER — Other Ambulatory Visit: Payer: Self-pay | Admitting: Internal Medicine

## 2021-07-10 DIAGNOSIS — F419 Anxiety disorder, unspecified: Secondary | ICD-10-CM

## 2021-07-23 ENCOUNTER — Encounter: Payer: Self-pay | Admitting: Internal Medicine

## 2021-07-23 ENCOUNTER — Other Ambulatory Visit: Payer: Self-pay

## 2021-07-23 ENCOUNTER — Ambulatory Visit: Payer: 59 | Admitting: Internal Medicine

## 2021-07-23 VITALS — BP 124/70 | HR 86 | Temp 97.7°F | Resp 17 | Ht 77.0 in | Wt 287.0 lb

## 2021-07-23 DIAGNOSIS — Z111 Encounter for screening for respiratory tuberculosis: Secondary | ICD-10-CM

## 2021-07-23 DIAGNOSIS — Z0000 Encounter for general adult medical examination without abnormal findings: Secondary | ICD-10-CM | POA: Diagnosis not present

## 2021-07-23 DIAGNOSIS — Z7252 High risk homosexual behavior: Secondary | ICD-10-CM

## 2021-07-23 DIAGNOSIS — I10 Essential (primary) hypertension: Secondary | ICD-10-CM | POA: Diagnosis not present

## 2021-07-23 DIAGNOSIS — E559 Vitamin D deficiency, unspecified: Secondary | ICD-10-CM

## 2021-07-23 DIAGNOSIS — E8881 Metabolic syndrome: Secondary | ICD-10-CM

## 2021-07-23 DIAGNOSIS — Z136 Encounter for screening for cardiovascular disorders: Secondary | ICD-10-CM

## 2021-07-23 DIAGNOSIS — E349 Endocrine disorder, unspecified: Secondary | ICD-10-CM

## 2021-07-23 DIAGNOSIS — Z0001 Encounter for general adult medical examination with abnormal findings: Secondary | ICD-10-CM

## 2021-07-23 DIAGNOSIS — G4733 Obstructive sleep apnea (adult) (pediatric): Secondary | ICD-10-CM

## 2021-07-23 DIAGNOSIS — Z1211 Encounter for screening for malignant neoplasm of colon: Secondary | ICD-10-CM

## 2021-07-23 DIAGNOSIS — R7303 Prediabetes: Secondary | ICD-10-CM

## 2021-07-23 DIAGNOSIS — Z79899 Other long term (current) drug therapy: Secondary | ICD-10-CM

## 2021-07-23 DIAGNOSIS — Z8249 Family history of ischemic heart disease and other diseases of the circulatory system: Secondary | ICD-10-CM

## 2021-07-23 DIAGNOSIS — E782 Mixed hyperlipidemia: Secondary | ICD-10-CM

## 2021-07-23 DIAGNOSIS — R5383 Other fatigue: Secondary | ICD-10-CM

## 2021-07-23 NOTE — Progress Notes (Signed)
Annual  Screening/Preventative Visit  & Comprehensive Evaluation & Examination  Future Appointments  Date Time Provider Department Center  07/23/2022  3:00 PM [name], [name], MD [id number] None            This very nice 35 y.o. single WM presents for a Screening /Preventative Visit & comprehensive evaluation and management of multiple medical co-morbidities.  Patient has been followed for HTN, HLD, Moderate Obesity, Insulin Resistance /PreDiabetes  and Vitamin D Deficiency.  Patient has OSA, but uses CPAP very infrequently due to mask intolerance. morbidities. Patient has GERD controlled on his meds.       HTN predates since  2014. Patient's BP has been controlled at home.  Today's BP is at goal -  124/70. Patient denies any cardiac symptoms as chest pain, palpitations, shortness of breath, dizziness or ankle swelling.       Patient's hyperlipidemia is controlled with diet and Rosuvastatin. Patient denies myalgias or other medication SE's. Last lipids were at goal except sl elevated Trig's:  Lab Results  Component Value Date   CHOL 110 12/11/2020   HDL 29 (L) 12/11/2020   LDLCALC 56 12/11/2020   TRIG 175 (H) 12/11/2020   CHOLHDL 3.8 12/11/2020             Patient has Morbid Obesity (BMI 35+/- ) and hx/o Pre-Diabetes/Insulin Resistance (A1c 5.3% / elevated Insulin 152 / 2015)  and  he was initiated on Metformin for Insulin resistance. and patient denies reactive hypoglycemic symptoms, visual blurring, diabetic polys or paresthesias. Last A1c was normal & at goal:   Lab Results  Component Value Date   HGBA1C 5.3 05/09/2021          Patient has hx/o Low Testosterone "212" in Nov 2016 and "207" in Jan 2017  and has self d/c'd the Testosterone replacement .       Finally, patient has history of Vitamin D Deficiency ("23" /2015 and "24" /2016)     and last vitamin D was at goal:   Lab Results  Component Value Date   VD25OH 73 12/11/2020     Current Outpatient Medications  on File Prior to Visit  Medication Sig   amLODipine (NORVASC) 10 MG tablet TAKE HALF TO ONE TABLET BY MOUTH DAILY FOR BLOOD PRESSURE   Ascorbic Acid (VITAMIN C) 1000 MG tablet Take 1,000 mg by mouth daily.   aspirin EC 81 MG tablet Take 81 mg by mouth 2 (two) times daily.   cetirizine (ZYRTEC) 10 MG tablet Take 1 tablet (10 mg total) by mouth daily. (Patient taking differently: Take 10 mg by mouth as needed.)   cloNIDine (CATAPRES) 0.2 MG tablet Take 1 tablet by mouth 3 times a day for blood pressure   famotidine (KLS ACID CONTROLLER MAX ST) 20 MG tablet Take 1 to 2 tablets 1 to 2 x /day as needed for Indigestion & Heartburn   fish oil-omega-3 fatty acids 1000 MG capsule Take 1 g by mouth 2 (two) times daily.   gabapentin (NEURONTIN) 100 MG capsule TAKE ONE CAPSULE BY MOUTH THREE TIMES DAILY AS NEEDED FOR PAIN   hydrochlorothiazide (HYDRODIURIL) 25 MG tablet Take 1 tablet by mouth daily for blood pressure & fluid retention/ankle swelling   hyoscyamine (LEVSIN SL) 0.125 MG SL tablet Dissolve  1 to 2 tablets under tongue3 to 4 x day if needed for Nausea, Vomiting, Cramping, Bloating  or Diarrhea   lisinopril (ZESTRIL) 20 MG tablet Take  1 tablet  2 x /day  for BP   LORazepam (ATIVAN) 1 MG tablet Take 1/2 to 1 tablet 2 to 3 x / day  ONLY if needed for Anxiety Attack & please try to limit to 5 days /week to avoid Addiction & Dementia   Magnesium 400 MG TABS Take 400 mg by mouth daily.   meloxicam (MOBIC) 15 MG tablet take half to one tablet by mouth daily with food for pain and inflammation **limit to 5 days/week to avoid kidney damage**   metFORMIN (GLUCOPHAGE-XR) 500 MG 24 hr tablet Take 1 to 2 tablets by mouth twice a day for diabetes   omeprazole (PRILOSEC) 20 MG capsule Take 1 capsule (20 mg total) by mouth 2 (two) times daily.   propranolol (INDERAL) 80 MG tablet TAKE ONE TABLET BY MOUTH THREE TIMES DAILY FOR BP   rosuvastatin (CRESTOR) 20 MG tablet Take 1 tablet by mouth once daily for  cholesterol   sucralfate (CARAFATE) 1 g tablet Take 1 tablet (1 g total) by mouth 4 (four) times daily.   VITAMIN D PO Take 10,000 Units by mouth daily.   zinc gluconate 50 MG tablet Take 50 mg by mouth daily.     Allergies  Allergen Reactions   Peanut-Containing Drug Products Anaphylaxis   Ceclor [Cefaclor] Hives and Swelling   Sulfa Antibiotics Hives    Past Medical History:  Diagnosis Date   Anxiety    Depression    Hidradenitis suppurativa    Hyperlipidemia    Hypertension    Prediabetes     Health Maintenance  Topic Date Due   PNEUMOCOCCAL POLYSACCHARIDE VACCINE AGE 37-64 HIGH RISK  Never done   COVID-19 Vaccine (1) Never done   Pneumococcal Vaccine 44-61 Years old (1 - PCV) Never done   FOOT EXAM  Never done   OPHTHALMOLOGY EXAM  12/10/2017   INFLUENZA VACCINE  07/29/2021   HEMOGLOBIN A1C  11/09/2021   TETANUS/TDAP  10/10/2025   Hepatitis C Screening  Completed   HIV Screening  Completed   HPV VACCINES  Aged Out    Immunization History  Administered Date(s) Administered   Hepatitis A, Adult 02/16/2019, 01/24/2020   Hepatitis B, adult 02/16/2019   Hepb-cpg 01/24/2020   Influenza Inj Mdck Quad With Preservative 11/01/2019, 12/11/2020   Influenza Split 10/11/2015   Influenza,inj,Quad PF,6+ Mos 10/13/2014   PPD Test 10/11/2015, 04/19/2019, 05/16/2020   Tdap 10/11/2015     Past Surgical History:  Procedure Laterality Date   None       Family History  Problem Relation Age of Onset   Heart disease Maternal Grandmother        Died from bypass surgery   Heart disease Mother        Bicuspid aortic valve   Heart disease Sister        Bicuspid aortic valve   Colitis Sister    Diabetes Sister    Heart disease Father        Bicuspid aortic valve   Breast cancer Paternal Grandmother    Diabetes Paternal Grandmother    Esophageal cancer Paternal Grandfather    Colon cancer Paternal Aunt     Social History   Socioeconomic History   Marital status:  Single  Occupational History   Occupation: Research scientist (life sciences): [employer]  Tobacco Use   Smoking status: Every Day    Packs/day: 0.60    Years: 10.00    Pack years: 6.00    Types: Cigarettes   Smokeless tobacco: Never  Vaping  Use   Vaping Use: Never used  Substance and Sexual Activity   Alcohol use: Yes    Comment: social   Drug use: Yes    Types: Marijuana   Sexual activity: Not on file  Social History Narrative   Lives alone.      ROS Constitutional: Denies fever, chills, weight loss/gain, headaches, insomnia,  night sweats or change in appetite. Does c/o fatigue. Eyes: Denies redness, blurred vision, diplopia, discharge, itchy or watery eyes.  ENT: Denies discharge, congestion, post nasal drip, epistaxis, sore throat, earache, hearing loss, dental pain, Tinnitus, Vertigo, Sinus pain or snoring.  Cardio: Denies chest pain, palpitations, irregular heartbeat, syncope, dyspnea, diaphoresis, orthopnea, PND, claudication or edema Respiratory: denies cough, dyspnea, DOE, pleurisy, hoarseness, laryngitis or wheezing.  Gastrointestinal: Denies dysphagia, heartburn, reflux, water brash, pain, cramps, nausea, vomiting, bloating, diarrhea, constipation, hematemesis, melena, hematochezia, jaundice or hemorrhoids Genitourinary: Denies dysuria, frequency, urgency, nocturia, hesitancy, discharge, hematuria or flank pain Musculoskeletal: Denies arthralgia, myalgia, stiffness, Jt. Swelling, pain, limp or strain/sprain. Denies Falls. Skin: Denies puritis, rash, hives, warts, acne, eczema or change in skin lesion Neuro: No weakness, tremor, incoordination, spasms, paresthesia or pain Psychiatric: Denies confusion, memory loss or sensory loss. Denies Depression. Endocrine: Denies change in weight, skin, hair change, nocturia, and paresthesia, diabetic polys, visual blurring or hyper / hypo glycemic episodes.  Heme/Lymph: No excessive bleeding, bruising or enlarged lymph nodes.   Physical  Exam  BP 124/70   Pulse 86   Temp 97.7 F (36.5 C)   Resp 17   Ht 6\' 5"  (1.956 m)   Wt 287 lb (130.2 kg)   SpO2 96%   BMI 34.03 kg/m   General Appearance: Well nourished and well groomed and in no apparent distress.  Eyes: PERRLA, EOMs, conjunctiva no swelling or erythema, normal fundi and vessels. Sinuses: No frontal/maxillary tenderness ENT/Mouth: EACs patent / TMs  nl. Nares clear without erythema, swelling, mucoid exudates. Oral hygiene is good. No erythema, swelling, or exudate. Tongue normal, non-obstructing. Tonsils not swollen or erythematous. Hearing normal.  Neck: Supple, thyroid not palpable. No bruits, nodes or JVD. Respiratory: Respiratory effort normal.  BS equal and clear bilateral without rales, rhonci, wheezing or stridor. Cardio: Heart sounds are normal with regular rate and rhythm and no murmurs, rubs or gallops. Peripheral pulses are normal and equal bilaterally without edema. No aortic or femoral bruits. Chest: symmetric with normal excursions and percussion.  Abdomen: Soft, with Nl bowel sounds. Nontender, no guarding, rebound, hernias, masses, or organomegaly.  Lymphatics: Non tender without lymphadenopathy.  Musculoskeletal: Full ROM all peripheral extremities, joint stability, 5/5 strength, and normal gait. Skin: Warm and dry without rashes, lesions, cyanosis, clubbing or  ecchymosis.  Neuro: Cranial nerves intact, reflexes equal bilaterally. Normal muscle tone, no cerebellar symptoms. Sensation intact.  Pysch: Alert and oriented X 3 with normal affect, insight and judgment appropriate.   Assessment and Plan  1. Annual Preventative/Screening Exam    2. Essential hypertension  - EKG 12-Lead - Urinalysis, Routine w reflex microscopic - Microalbumin / creatinine urine ratio - CBC with Differential/Platelet - COMPLETE METABOLIC PANEL WITH GFR - Magnesium - TSH  3. Hyperlipidemia, mixed  - EKG 12-Lead - Lipid panel - TSH  4. Insulin  resistance  - EKG 12-Lead - Hemoglobin A1c - Insulin, random  5. Vitamin D deficiency  - VITAMIN D 25 Hydroxy   6. Prediabetes  - EKG 12-Lead - Hemoglobin A1c - Insulin, random  7. Testosterone deficiency  - Testosterone  8. OSA (obstructive  sleep apnea)   9. Screening-pulmonary TB  - TB Skin Test  10. Screening for colorectal cancer  - POC Hemoccult Bld/Stl   11. Screening for ischemic heart disease  - EKG 12-Lead  12. Family history of ischemic heart disease  - EKG 12-Lead  13. Fatigue  - Iron, Total/Total Iron Binding Cap - Vitamin B12 - Testosterone - CBC with Differential/Platelet - TSH  14. Medication management  - CBC with Differential/Platelet - COMPLETE METABOLIC PANEL WITH GFR - Magnesium - Lipid panel - TSH - Hemoglobin A1c - VITAMIN D 25 Hydroxy         Patient was counseled in prudent diet, weight control to achieve/maintain BMI less than 25, BP monitoring, regular exercise and medications as discussed.  Discussed med effects and SE's. Routine screening labs and tests as requested with regular follow-up as recommended. Over 40 minutes of exam, counseling, chart review and high complex critical decision making was performed   Marinus Maw, MD

## 2021-07-23 NOTE — Patient Instructions (Signed)
Due to recent changes in healthcare laws, you may see the results of your imaging and laboratory studies on MyChart before your provider has had a chance to review them.  We understand that in some cases there may be results that are confusing or concerning to you. Not all laboratory results come back in the same time frame and the provider may be waiting for multiple results in order to interpret others.  Please give us 48 hours in order for your provider to thoroughly review all the results before contacting the office for clarification of your results.   +++++++++++++++++++++++++++++++++  Vit D  & Vit C 1,000 mg   are recommended to help protect  against the Covid-19 and other Corona viruses.    Also it's recommended  to take  Zinc 50 mg  to help  protect against the Covid-19   and best place to get  is also on [employer]  and don't pay more than 6-8 cents /pill !  ================================= Coronavirus (COVID-19) Are you at risk?  Are you at risk for the Coronavirus (COVID-19)?  To be considered HIGH RISK for Coronavirus (COVID-19), you have to meet the following criteria:  Traveled to [place], [place], [place], [place] or [place]; or in the [place] to [place], [place], [place]  or [place]; and have fever, cough, and shortness of breath within the last 2 weeks of travel OR Been in close contact with a person diagnosed with COVID-19 within the last 2 weeks and have  fever, cough,and shortness of breath  IF YOU DO NOT MEET THESE CRITERIA, YOU ARE CONSIDERED LOW RISK FOR COVID-19.  What to do if you are HIGH RISK for COVID-19?  If you are having a medical emergency, call 911. Seek medical care right away. Before you go to a doctor's office, urgent care or emergency department,  call ahead and tell them about your recent travel, contact with someone diagnosed with COVID-19   and your symptoms.  You should receive instructions from your physician's office  regarding next steps of care.  When you arrive at healthcare provider, tell the healthcare staff immediately you have returned from  visiting [place], [place], [place], [place] or [place]; or traveled in the [place] to [place], [place],  [place] or [place] in the last two weeks or you have been in close contact with a person diagnosed with  COVID-19 in the last 2 weeks.   Tell the health care staff about your symptoms: fever, cough and shortness of breath. After you have been seen by a medical provider, you will be either: Tested for (COVID-19) and discharged home on quarantine except to seek medical care if  symptoms worsen, and asked to  Stay home and avoid contact with others until you get your results (4-5 days)  Avoid travel on public transportation if possible (such as bus, train, or airplane) or Sent to the Emergency Department by EMS for evaluation, COVID-19 testing  and  possible admission depending on your condition and test results.  What to do if you are LOW RISK for COVID-19?  Reduce your risk of any infection by using the same precautions used for avoiding the common cold or flu:  Wash your hands often with soap and warm water for at least 20 seconds.  If soap and water are not readily available,  use an alcohol-based hand sanitizer with at least 60% alcohol.  If coughing or sneezing, cover your mouth and nose by coughing   or sneezing into the elbow areas of your shirt or coat,  into a tissue or into your sleeve (not your hands). Avoid shaking hands with others and consider head nods or verbal greetings only. Avoid touching your eyes, nose, or mouth with unwashed hands.  Avoid close contact with people who are sick. Avoid places or events with large numbers of people in one location, like concerts or sporting events. Carefully consider travel plans you have or are making. If you are planning any travel outside or inside the [place], visit the CDC's Travelers' Health  webpage for the latest health notices. If you have some symptoms but not all symptoms, continue to monitor at home and seek medical attention  if your symptoms worsen. If you are having a medical emergency, call 911. >>>>>>>>>>>>>>>>>>>>>>>> Preventive Care for Adults  A healthy lifestyle and preventive care can promote health and wellness. Preventive health guidelines for men include the following key practices: A routine yearly physical is a good way to check with your health care provider about your health and preventative screening. It is a chance to share any concerns and updates on your health and to receive a thorough exam. Visit your dentist for a routine exam and preventative care every 6 months. Brush your teeth twice a day and floss once a day. Good oral hygiene prevents tooth decay and gum disease. The frequency of eye exams is based on your age, health, family medical history, use of contact lenses, and other factors. Follow your health care provider's recommendations for frequency of eye exams. Eat a healthy diet. Foods such as vegetables, fruits, whole grains, low-fat dairy products, and lean protein foods contain the nutrients you need without too many calories. Decrease your intake of foods high in solid fats, added sugars, and salt. Eat the right amount of calories for you. Get information about a proper diet from your health care provider, if necessary. Regular physical exercise is one of the most important things you can do for your health. Most adults should get at least 150 minutes of moderate-intensity exercise (any activity that increases your heart rate and causes you to sweat) each week. In addition, most adults need muscle-strengthening exercises on 2 or more days a week. Maintain a healthy weight. The body mass index (BMI) is a screening tool to identify possible weight problems. It provides an estimate of body fat based on height and weight. Your health care provider can  find your BMI and can help you achieve or maintain a healthy weight. For adults 20 years and older: A BMI below 18.5 is considered underweight. A BMI of 18.5 to 24.9 is normal. A BMI of 25 to 29.9 is considered overweight. A BMI of 30 and above is considered obese. Maintain normal blood lipids and cholesterol levels by exercising and minimizing your intake of saturated fat. Eat a balanced diet with plenty of fruit and vegetables. Blood tests for lipids and cholesterol should begin at age 20 and be repeated every 5 years. If your lipid or cholesterol levels are high, you are over 50, or you are at high risk for heart disease, you may need your cholesterol levels checked more frequently. Ongoing high lipid and cholesterol levels should be treated with medicines if diet and exercise are not working. If you smoke, find out from your health care provider how to quit. If you do not use tobacco, do not start. Lung cancer screening is recommended for adults aged 55-80 years who are at high risk for   developing lung cancer because of a history of smoking. A yearly low-dose CT scan of the lungs is recommended for people who have at least a 30-pack-year history of smoking and are a current smoker or have quit within the past 15 years. A pack year of smoking is smoking an average of 1 pack of cigarettes a day for 1 year (for example: 1 pack a day for 30 years or 2 packs a day for 15 years). Yearly screening should continue until the smoker has stopped smoking for at least 15 years. Yearly screening should be stopped for people who develop a health problem that would prevent them from having lung cancer treatment. If you choose to drink alcohol, do not have more than 2 drinks per day. One drink is considered to be 12 ounces (355 mL) of beer, 5 ounces (148 mL) of wine, or 1.5 ounces (44 mL) of liquor. High blood pressure causes heart disease and increases the risk of stroke. Your blood pressure should be checked. Ongoing  high blood pressure should be treated with medicines, if weight loss and exercise are not effective. If you are 50-61 years old, ask your health care provider if you should take aspirin to prevent heart disease. Diabetes screening involves taking a blood sample to check your fasting blood sugar level. Testing should be considered at a younger age or be carried out more frequently if you are overweight and have at least 1 risk factor for diabetes. Colorectal cancer can be detected and often prevented. Most routine colorectal cancer screening begins at the age of 32 and continues through age 64. However, your health care provider may recommend screening at an earlier age if you have risk factors for colon cancer. On a yearly basis, your health care provider may provide home test kits to check for hidden blood in the stool. Use of a small camera at the end of a tube to directly examine the colon (sigmoidoscopy or colonoscopy) can detect the earliest forms of colorectal cancer. Talk to your health care provider about this at age 48, when routine screening begins. Direct exam of the colon should be repeated every 5-10 years through age 56, unless early forms of precancerous polyps or small growths are found. Screening for abdominal aortic aneurysm (AAA)  are recommended for persons over age 52 who have history of hypertensionor who are current or former smokers. Talk with your health care provider about prostate cancer screening. Testicular cancer screening is recommended for adult males. Screening includes self-exam, a health care provider exam, and other screening tests. Consult with your health care provider about any symptoms you have or any concerns you have about testicular cancer. Use sunscreen. Apply sunscreen liberally and repeatedly throughout the day. You should seek shade when your shadow is shorter than you. Protect yourself by wearing long sleeves, pants, a wide-brimmed hat, and sunglasses year  round, whenever you are outdoors. Once a month, do a whole-body skin exam, using a mirror to look at the skin on your back. Tell your health care provider about new moles, moles that have irregular borders, moles that are larger than a pencil eraser, or moles that have changed in shape or color. Stay current with required vaccines (immunizations). Influenza vaccine. All adults should be immunized every year. Tetanus, diphtheria, and acellular pertussis (Td, Tdap) vaccine. An adult who has not previously received Tdap or who does not know his vaccine status should receive 1 dose of Tdap. This initial dose should be followed by tetanus  and diphtheria toxoids (Td) booster doses every 10 years. Adults with an unknown or incomplete history of completing a 3-dose immunization series with Td-containing vaccines should begin or complete a primary immunization series including a Tdap dose. Adults should receive a Td booster every 10 years. Zoster vaccine. One dose is recommended for adults aged 84 years or older unless certain conditions are present.  Pneumococcal 13-valent conjugate (PCV13) vaccine. When indicated, a person who is uncertain of his immunization history and has no record of immunization should receive the PCV13 vaccine. An adult aged 18 years or older who has certain medical conditions and has not been previously immunized should receive 1 dose of PCV13 vaccine. This PCV13 should be followed with a dose of pneumococcal polysaccharide (PPSV23) vaccine. The PPSV23 vaccine dose should be obtained at least 8 weeks after the dose of PCV13 vaccine. An adult aged 36 years or older who has certain medical conditions and previously received 1 or more doses of PPSV23 vaccine should receive 1 dose of PCV13. The PCV13 vaccine dose should be obtained 1 or more years after the last PPSV23 vaccine dose.  Pneumococcal polysaccharide (PPSV23) vaccine. When PCV13 is also indicated, PCV13 should be obtained first. All  adults aged 40 years and older should be immunized. An adult younger than age 15 years who has certain medical conditions should be immunized. Any person who resides in a nursing home or long-term care facility should be immunized. An adult smoker should be immunized. People with an immunocompromised condition and certain other conditions should receive both PCV13 and PPSV23 vaccines. People with human immunodeficiency virus (HIV) infection should be immunized as soon as possible after diagnosis. Immunization during chemotherapy or radiation therapy should be avoided. Routine use of PPSV23 vaccine is not recommended for American Indians, [place], or people younger than 65 years unless there are medical conditions that require PPSV23 vaccine. When indicated, people who have unknown immunization and have no record of immunization should receive PPSV23 vaccine. One-time revaccination 5 years after the first dose of PPSV23 is recommended for people aged 19-64 years who have chronic kidney failure, nephrotic syndrome, asplenia, or immunocompromised conditions. People who received 1-2 doses of PPSV23 before age 30 years should receive another dose of PPSV23 vaccine at age 60 years or later if at least 5 years have passed since the previous dose. Doses of PPSV23 are not needed for people immunized with PPSV23 at or after age 48 years. Hepatitis A vaccine. Adults who wish to be protected from this disease, have certain high-risk conditions, work with hepatitis A-infected animals, work in hepatitis A research labs, or travel to or work in countries with a high rate of hepatitis A should be immunized. Adults who were previously unvaccinated and who anticipate close contact with an international adoptee during the first 60 days after arrival in the [place] from a country with a high rate of hepatitis A should be immunized. Hepatitis B vaccine. Adults should be immunized if they wish to be protected from this  disease, have certain high-risk conditions, may be exposed to blood or other infectious body fluids, are household contacts or sex partners of hepatitis B positive people, are clients or workers in certain care facilities, or travel to or work in countries with a high rate of hepatitis B.  Preventive Service / Frequency  Ages 6 to 1 Blood pressure check. Lipid and cholesterol check. Hepatitis C blood test.** / For any individual with known risks for hepatitis C. Skin self-exam. /  Monthly. Influenza vaccine. / Every year. Tetanus, diphtheria, and acellular pertussis (Tdap, Td) vaccine.** / Consult your health care provider. 1 dose of Td every 10 years. HPV vaccine. / 3 doses over 6 months, if 26 or younger. Measles, mumps, rubella (MMR) vaccine.** / You need at least 1 dose of MMR if you were born in 1957 or later. You may also need a second dose. Pneumococcal 13-valent conjugate (PCV13) vaccine.** / Consult your health care provider. Pneumococcal polysaccharide (PPSV23) vaccine.** / 1 to 2 doses if you smoke cigarettes or if you have certain conditions. Meningococcal vaccine.** / 1 dose if you are age 19 to 21 years and a first-year college student living in a residence hall, or have one of several medical conditions. You may also need additional booster doses. Hepatitis A vaccine.** / Consult your health care provider. Hepatitis B vaccine.** / Consult your health care provider. +++++++++ Recommend Adult Low Dose Aspirin or  coated  Aspirin 81 mg daily  To reduce risk of Colon Cancer 40 %,  Skin Cancer 26 % ,  Melanoma 46%  and  Pancreatic cancer 60% ++++++++++++++++++ Vitamin D goal  is between 70-100.  Please make sure that you are taking your Vitamin D as directed.  It is very important as a natural anti-inflammatory  helping hair, skin, and nails, as well as reducing stroke and heart attack risk.  It helps your bones and helps with mood. It also decreases numerous cancer risks  so please take it as directed.  Low Vit D is associated with a 200-300% higher risk for CANCER  and 200-300% higher risk for HEART   ATTACK  &  STROKE.   ...................................... It is also associated with higher death rate at younger ages,  autoimmune diseases like Rheumatoid arthritis, Lupus, Multiple Sclerosis.    Also many other serious conditions, like depression, Alzheimer's Dementia, infertility, muscle aches, fatigue, fibromyalgia - just to name a few. +++++++++++++++++++++ Recommend the book "The END of DIETING" by Dr Joel Fuhrman  & the book "The END of DIABETES " by Dr Joel Fuhrman At [employer] - get book & Audio CD's    Being diabetic has a  300% increased risk for heart attack, stroke, cancer, and alzheimer- type vascular dementia. It is very important that you work harder with diet by avoiding all foods that are white. Avoid white rice (brown & wild rice is OK), white potatoes (sweetpotatoes in moderation is OK), White bread or wheat bread or anything made out of white flour like bagels, donuts, rolls, buns, biscuits, cakes, pastries, cookies, pizza crust, and pasta (made from white flour & egg whites) - vegetarian pasta or spinach or wheat pasta is OK. Multigrain breads like Arnold's or [employer], or multigrain sandwich thins or flatbreads.  Diet, exercise and weight loss can reverse and cure diabetes in the early stages.  Diet, exercise and weight loss is very important in the control and prevention of complications of diabetes which affects every system in your body, ie. Brain - dementia/stroke, eyes - glaucoma/blindness, heart - heart attack/heart failure, kidneys - dialysis, stomach - gastric paralysis, intestines - malabsorption, nerves - severe painful neuritis, circulation - gangrene & loss of a leg(s), and finally cancer and Alzheimers.    I recommend avoid fried & greasy foods,  sweets/candy, white rice (brown or wild rice or Quinoa is OK), white potatoes  (sweet potatoes are OK) - anything made from white flour - bagels, doughnuts, rolls, buns, biscuits,white and wheat breads, pizza crust and   traditional pasta made of white flour & egg white(vegetarian pasta or spinach or wheat pasta is OK).  Multi-grain bread is OK - like multi-grain flat bread or sandwich thins. Avoid alcohol in excess. Exercise is also important.    Eat all the vegetables you want - avoid meat, especially red meat and dairy - especially cheese.  Cheese is the most concentrated form of trans-fats which is the worst thing to clog up our arteries. Veggie cheese is OK which can be found in the fresh produce section at [employer] or [employer] or [employer]  +++++++++++++++++++ DASH Eating Plan  DASH stands for "Dietary Approaches to Stop Hypertension."   The DASH eating plan is a healthy eating plan that has been shown to reduce high blood pressure (hypertension). Additional health benefits may include reducing the risk of type 2 diabetes mellitus, heart disease, and stroke. The DASH eating plan may also help with weight loss. WHAT DO I NEED TO KNOW ABOUT THE DASH EATING PLAN? For the DASH eating plan, you will follow these general guidelines: Choose foods with a percent daily value for sodium of less than 5% (as listed on the food label). Use salt-free seasonings or herbs instead of table salt or sea salt. Check with your health care provider or pharmacist before using salt substitutes. Eat lower-sodium products, often labeled as "lower sodium" or "no salt added." Eat fresh foods. Eat more vegetables, fruits, and low-fat dairy products. Choose whole grains. Look for the word "whole" as the first word in the ingredient list. Choose fish  Limit sweets, desserts, sugars, and sugary drinks. Choose heart-healthy fats. Eat veggie cheese  Eat more home-cooked food and less restaurant, buffet, and fast food. Limit fried foods. Cook foods using methods other than frying. Limit  canned vegetables. If you do use them, rinse them well to decrease the sodium. When eating at a restaurant, ask that your food be prepared with less salt, or no salt if possible.                      WHAT FOODS CAN I EAT? Read Dr Fara Olden Fuhrman's books on The End of Dieting & The End of Diabetes  Grains Whole grain or whole wheat bread. Brown rice. Whole grain or whole wheat pasta. Quinoa, bulgur, and whole grain cereals. Low-sodium cereals. Corn or whole wheat flour tortillas. Whole grain cornbread. Whole grain crackers. Low-sodium crackers.  Vegetables Fresh or frozen vegetables (raw, steamed, roasted, or grilled). Low-sodium or reduced-sodium tomato and vegetable juices. Low-sodium or reduced-sodium tomato sauce and paste. Low-sodium or reduced-sodium canned vegetables.   Fruits All fresh, canned (in natural juice), or frozen fruits.  Protein Products  All fish and seafood.  Dried beans, peas, or lentils. Unsalted nuts and seeds. Unsalted canned beans.  Dairy Low-fat dairy products, such as skim or 1% milk, 2% or reduced-fat cheeses, low-fat ricotta or cottage cheese, or plain low-fat yogurt. Low-sodium or reduced-sodium cheeses.  Fats and Oils Tub margarines without trans fats. Light or reduced-fat mayonnaise and salad dressings (reduced sodium). Avocado. Safflower, olive, or canola oils. Natural peanut or almond butter.  Other Unsalted popcorn and pretzels. The items listed above may not be a complete list of recommended foods or beverages. Contact your dietitian for more options.  +++++++++++++++++++  WHAT FOODS ARE NOT RECOMMENDED? Grains/ White flour or wheat flour White bread. White pasta. White rice. Refined cornbread. Bagels and croissants. Crackers that contain trans fat.  Vegetables  Creamed or fried vegetables. Vegetables  in a . Regular canned vegetables. Regular canned tomato sauce and paste. Regular tomato and vegetable juices.  Fruits Dried fruits. Canned fruit  in light or heavy syrup. Fruit juice.  Meat and Other Protein Products Meat in general - RED meat & White meat.  Fatty cuts of meat. Ribs, chicken wings, all processed meats as bacon, sausage, bologna, salami, fatback, hot dogs, bratwurst and packaged luncheon meats.  Dairy Whole or 2% milk, cream, half-and-half, and cream cheese. Whole-fat or sweetened yogurt. Full-fat cheeses or blue cheese. Non-dairy creamers and whipped toppings. Processed cheese, cheese spreads, or cheese curds.  Condiments Onion and garlic salt, seasoned salt, table salt, and sea salt. Canned and packaged gravies. Worcestershire sauce. Tartar sauce. Barbecue sauce. Teriyaki sauce. Soy sauce, including reduced sodium. Steak sauce. Fish sauce. Oyster sauce. Cocktail sauce. Horseradish. Ketchup and mustard. Meat flavorings and tenderizers. Bouillon cubes. Hot sauce. Tabasco sauce. Marinades. Taco seasonings. Relishes.  Fats and Oils Butter, stick margarine, lard, shortening and bacon fat. Coconut, palm kernel, or palm oils. Regular salad dressings.  Pickles and olives. Salted popcorn and pretzels.  The items listed above may not be a complete list of foods and beverages to avoid.   

## 2021-07-24 ENCOUNTER — Other Ambulatory Visit (INDEPENDENT_AMBULATORY_CARE_PROVIDER_SITE_OTHER): Payer: 59

## 2021-07-24 DIAGNOSIS — Z111 Encounter for screening for respiratory tuberculosis: Secondary | ICD-10-CM | POA: Diagnosis not present

## 2021-07-24 NOTE — Progress Notes (Signed)
============================================================ -   Test results slightly outside the reference range are not unusual. If there is anything important, I will review this with you,  otherwise it is considered normal test values.  If you have further questions,  please do not hesitate to contact me at the office or via My Chart.  ============================================================ ============================================================  -  Testosterone level still slightly low, but is better - So Continue Zinc !  - Also exercising 20-30 minutes TWICE / Day will help raise testosterone levels ,  ============================================================  Also, losing weight will usually help raise Testosterone levels  ============================================================ ============================================================  -  Total Chol = 114  and LDL Chol = 86 - Both  Excellent   - Very low risk for Heart Attack  / Stroke ============================================================ ============================================================  -  But Triglycerides (    226    ) or fats in blood are too high  (goal is less than 150)    - Recommend avoid fried & greasy foods,  sweets / candy,   - Avoid white rice  (brown or wild rice or Quinoa is OK),   - Avoid white potatoes  (sweet potatoes are OK)   - Avoid anything made from white flour  - bagels, doughnuts, rolls, buns, biscuits, white and   wheat breads, pizza crust and traditional  pasta made of white flour & egg white  - (vegetarian pasta or spinach or wheat pasta is OK).    - Multi-grain bread is OK - like multi-grain flat bread or  sandwich thins.   - Avoid alcohol in excess.   - Exercise is also important. ============================================================ ============================================================  -  A1c - Normal - Great -No Diabetes   ! ============================================================ ============================================================  -  Vitamin D = 74 - Excellent !  - Please keep dose same ============================================================ ============================================================  -  HIV still pending ============================================================ ============================================================  -  All Else - CBC - Kidneys - Electrolytes - Liver - Magnesium & Thyroid    - all  Normal / OK ============================================================ ============================================================

## 2021-07-25 LAB — COMPLETE METABOLIC PANEL WITH GFR
AG Ratio: 2.9 (calc) — ABNORMAL HIGH (ref 1.0–2.5)
ALT: 41 U/L (ref 9–46)
AST: 23 U/L (ref 10–40)
Albumin: 4.9 g/dL (ref 3.6–5.1)
Alkaline phosphatase (APISO): 42 U/L (ref 36–130)
BUN: 16 mg/dL (ref 7–25)
CO2: 30 mmol/L (ref 20–32)
Calcium: 9.7 mg/dL (ref 8.6–10.3)
Chloride: 103 mmol/L (ref 98–110)
Creat: 0.92 mg/dL (ref 0.60–1.26)
Globulin: 1.7 g/dL (calc) — ABNORMAL LOW (ref 1.9–3.7)
Glucose, Bld: 83 mg/dL (ref 65–99)
Potassium: 4 mmol/L (ref 3.5–5.3)
Sodium: 142 mmol/L (ref 135–146)
Total Bilirubin: 0.7 mg/dL (ref 0.2–1.2)
Total Protein: 6.6 g/dL (ref 6.1–8.1)
eGFR: 113 mL/min/{1.73_m2} (ref >=60)

## 2021-07-25 LAB — MICROALBUMIN / CREATININE URINE RATIO
Creatinine, Urine: 116 mg/dL (ref 20–320)
Microalb Creat Ratio: 4 mcg/mg creat (ref <30)
Microalb, Ur: 0.5 mg/dL

## 2021-07-25 LAB — CBC WITH DIFFERENTIAL/PLATELET
Absolute Monocytes: 510 cells/uL (ref 200–950)
Basophils Absolute: 53 cells/uL (ref 0–200)
Basophils Relative: 0.7 %
Eosinophils Absolute: 203 cells/uL (ref 15–500)
Eosinophils Relative: 2.7 %
HCT: 41.8 % (ref 38.5–50.0)
Hemoglobin: 14.7 g/dL (ref 13.2–17.1)
Lymphs Abs: 2610 cells/uL (ref 850–3900)
MCH: 30.8 pg (ref 27.0–33.0)
MCHC: 35.2 g/dL (ref 32.0–36.0)
MCV: 87.4 fL (ref 80.0–100.0)
MPV: 11.2 fL (ref 7.5–12.5)
Monocytes Relative: 6.8 %
Neutro Abs: 4125 cells/uL (ref 1500–7800)
Neutrophils Relative %: 55 %
Platelets: 172 10*3/uL (ref 140–400)
RBC: 4.78 10*6/uL (ref 4.20–5.80)
RDW: 12.8 % (ref 11.0–15.0)
Total Lymphocyte: 34.8 %
WBC: 7.5 10*3/uL (ref 3.8–10.8)

## 2021-07-25 LAB — MAGNESIUM: Magnesium: 2.3 mg/dL (ref 1.5–2.5)

## 2021-07-25 LAB — URINALYSIS, ROUTINE W REFLEX MICROSCOPIC
Bilirubin Urine: NEGATIVE
Glucose, UA: NEGATIVE
Hgb urine dipstick: NEGATIVE
Ketones, ur: NEGATIVE
Leukocytes,Ua: NEGATIVE
Nitrite: NEGATIVE
Protein, ur: NEGATIVE
Specific Gravity, Urine: 1.02 (ref 1.001–1.035)
pH: 6 (ref 5.0–8.0)

## 2021-07-25 LAB — LIPID PANEL
Cholesterol: 114 mg/dL (ref <200)
HDL: 30 mg/dL — ABNORMAL LOW (ref >=40)
LDL Cholesterol (Calc): 56 mg/dL (calc)
Non-HDL Cholesterol (Calc): 84 mg/dL (calc) (ref <130)
Total CHOL/HDL Ratio: 3.8 (calc) (ref <5.0)
Triglycerides: 226 mg/dL — ABNORMAL HIGH (ref <150)

## 2021-07-25 LAB — HEMOGLOBIN A1C
Hgb A1c MFr Bld: 4.6 % of total Hgb (ref <5.7)
Mean Plasma Glucose: 85 mg/dL
eAG (mmol/L): 4.7 mmol/L

## 2021-07-25 LAB — IRON, TOTAL/TOTAL IRON BINDING CAP
%SAT: 35 % (calc) (ref 20–48)
Iron: 106 ug/dL (ref 50–180)
TIBC: 303 mcg/dL (calc) (ref 250–425)

## 2021-07-25 LAB — TEST AUTHORIZATION

## 2021-07-25 LAB — VITAMIN D 25 HYDROXY (VIT D DEFICIENCY, FRACTURES): Vit D, 25-Hydroxy: 74 ng/mL (ref 30–100)

## 2021-07-25 LAB — TESTOSTERONE: Testosterone: 213 ng/dL — ABNORMAL LOW (ref 250–827)

## 2021-07-25 LAB — TSH: TSH: 0.81 mIU/L (ref 0.40–4.50)

## 2021-07-25 LAB — HIV ANTIBODY (ROUTINE TESTING W REFLEX): HIV 1&2 Ab, 4th Generation: NONREACTIVE

## 2021-07-25 LAB — VITAMIN B12: Vitamin B-12: 629 pg/mL (ref 200–1100)

## 2021-07-25 LAB — INSULIN, RANDOM: Insulin: 71 u[IU]/mL — ABNORMAL HIGH

## 2021-07-26 NOTE — Progress Notes (Signed)
============================================================ ============================================================  -    HIV returned Negative & OK   ============================================================ ============================================================

## 2021-08-15 ENCOUNTER — Other Ambulatory Visit: Payer: Self-pay | Admitting: Adult Health

## 2021-08-16 ENCOUNTER — Other Ambulatory Visit: Payer: Self-pay | Admitting: Adult Health

## 2021-08-16 MED ORDER — GABAPENTIN 100 MG PO CAPS: ORAL_CAPSULE | ORAL | 1 refills | Status: DC

## 2021-08-17 ENCOUNTER — Other Ambulatory Visit: Payer: Self-pay | Admitting: Adult Health

## 2021-09-18 ENCOUNTER — Emergency Department (HOSPITAL_BASED_OUTPATIENT_CLINIC_OR_DEPARTMENT_OTHER)
Admission: EM | Admit: 2021-09-18 | Discharge: 2021-09-18 | Disposition: A | Discharge status: Home / Self Care | Payer: 59 | Attending: Emergency Medicine | Admitting: Emergency Medicine

## 2021-09-18 ENCOUNTER — Other Ambulatory Visit: Payer: Self-pay

## 2021-09-18 ENCOUNTER — Encounter (HOSPITAL_BASED_OUTPATIENT_CLINIC_OR_DEPARTMENT_OTHER): Payer: Self-pay

## 2021-09-18 DIAGNOSIS — Z79899 Other long term (current) drug therapy: Secondary | ICD-10-CM | POA: Diagnosis not present

## 2021-09-18 DIAGNOSIS — Z7984 Long term (current) use of oral hypoglycemic drugs: Secondary | ICD-10-CM | POA: Diagnosis not present

## 2021-09-18 DIAGNOSIS — R7303 Prediabetes: Secondary | ICD-10-CM | POA: Diagnosis not present

## 2021-09-18 DIAGNOSIS — R112 Nausea with vomiting, unspecified: Secondary | ICD-10-CM | POA: Insufficient documentation

## 2021-09-18 DIAGNOSIS — Z7982 Long term (current) use of aspirin: Secondary | ICD-10-CM | POA: Insufficient documentation

## 2021-09-18 DIAGNOSIS — R1084 Generalized abdominal pain: Secondary | ICD-10-CM | POA: Diagnosis not present

## 2021-09-18 DIAGNOSIS — Z91010 Allergy to peanuts: Secondary | ICD-10-CM | POA: Insufficient documentation

## 2021-09-18 DIAGNOSIS — F17210 Nicotine dependence, cigarettes, uncomplicated: Secondary | ICD-10-CM | POA: Insufficient documentation

## 2021-09-18 DIAGNOSIS — I10 Essential (primary) hypertension: Secondary | ICD-10-CM | POA: Insufficient documentation

## 2021-09-18 LAB — COMPREHENSIVE METABOLIC PANEL
ALT: 51 U/L — ABNORMAL HIGH (ref 0–44)
AST: 32 U/L (ref 15–41)
Albumin: 4.4 g/dL (ref 3.5–5.0)
Alkaline Phosphatase: 37 U/L — ABNORMAL LOW (ref 38–126)
Anion gap: 8 (ref 5–15)
BUN: 17 mg/dL (ref 6–20)
CO2: 25 mmol/L (ref 22–32)
Calcium: 8.9 mg/dL (ref 8.9–10.3)
Chloride: 104 mmol/L (ref 98–111)
Creatinine, Ser: 0.86 mg/dL (ref 0.61–1.24)
GFR, Estimated: >60 mL/min (ref >60)
Glucose, Bld: 117 mg/dL — ABNORMAL HIGH (ref 70–99)
Potassium: 3.3 mmol/L — ABNORMAL LOW (ref 3.5–5.1)
Sodium: 137 mmol/L (ref 135–145)
Total Bilirubin: 0.5 mg/dL (ref 0.3–1.2)
Total Protein: 6.6 g/dL (ref 6.5–8.1)

## 2021-09-18 LAB — CBC WITH DIFFERENTIAL/PLATELET
Abs Immature Granulocytes: 0.03 10*3/uL (ref 0.00–0.07)
Basophils Absolute: 0.1 10*3/uL (ref 0.0–0.1)
Basophils Relative: 1 %
Eosinophils Absolute: 0.2 10*3/uL (ref 0.0–0.5)
Eosinophils Relative: 2 %
HCT: 39.3 % (ref 39.0–52.0)
Hemoglobin: 14.6 g/dL (ref 13.0–17.0)
Immature Granulocytes: 0 %
Lymphocytes Relative: 23 %
Lymphs Abs: 2.5 10*3/uL (ref 0.7–4.0)
MCH: 31.3 pg (ref 26.0–34.0)
MCHC: 37.2 g/dL — ABNORMAL HIGH (ref 30.0–36.0)
MCV: 84.2 fL (ref 80.0–100.0)
Monocytes Absolute: 0.6 10*3/uL (ref 0.1–1.0)
Monocytes Relative: 6 %
Neutro Abs: 7.4 10*3/uL (ref 1.7–7.7)
Neutrophils Relative %: 68 %
Platelets: 171 10*3/uL (ref 150–400)
RBC: 4.67 MIL/uL (ref 4.22–5.81)
RDW: 12.6 % (ref 11.5–15.5)
WBC: 10.9 10*3/uL — ABNORMAL HIGH (ref 4.0–10.5)
nRBC: 0 % (ref 0.0–0.2)

## 2021-09-18 LAB — LIPASE, BLOOD: Lipase: 24 U/L (ref 11–51)

## 2021-09-18 MED ORDER — ONDANSETRON HCL 4 MG/2ML IJ SOLN
4.0000 mg | Freq: Once | INTRAMUSCULAR | Status: AC
  Administered 2021-09-18: 4 mg via INTRAVENOUS
  Filled 2021-09-18: qty 2

## 2021-09-18 MED ORDER — ONDANSETRON 4 MG PO TBDP: 4.0000 mg | ORAL_TABLET | Freq: Three times a day (TID) | ORAL | 0 refills | Status: DC | PRN

## 2021-09-18 MED ORDER — SODIUM CHLORIDE 0.9 % IV BOLUS
1000.0000 mL | Freq: Once | INTRAVENOUS | Status: AC
Start: 2021-09-18 — End: 2021-09-18
  Administered 2021-09-18: 1000 mL via INTRAVENOUS

## 2021-09-18 NOTE — ED Notes (Signed)
Pt given gingerale and crackers 

## 2021-09-18 NOTE — Discharge Instructions (Addendum)
You were seen in the emergency department today for nausea and vomiting.  It is likely that this is due to what ever you ate last night and mixing that with alcohol.  I have low suspicion that this has anything to do with your pancreas or appendix.  Your labs were unremarkable and reassuring.  We gave you fluids and Zofran which made you feel better.  You also tolerated food and water while you are here without vomiting.  I am discharging you with a medication called Zofran to help with nausea over the next 24 hours.  Please follow-up with your primary care doctor as needed you may also return to emergency department if you have increasing nausea and vomiting that is not stopped by Zofran, you start to feel lightheaded or dizzy, short of breath, chest pain or increasing abdominal pain.  Please also return the emergency department for vomiting up blood, or blood coming from your rectum.

## 2021-09-18 NOTE — ED Triage Notes (Signed)
Pt c/o n/v started ~7am-states he had [place] food and ETOH last night-questions if sx r/t to either-NAD-steady gait

## 2021-09-18 NOTE — ED Provider Notes (Signed)
[facility] EMERGENCY DEPARTMENT Provider Note   CSN: [id number] Arrival date & time: 09/18/21  1623     History Chief Complaint  Patient presents with   Vomiting    Spencer Love is a 35 y.o. male.  Past medical history of hypertension, hyperlipidemia, GERD with gastric ulcer who presents emergency department for vomiting.  States that last night he ate [place] food including fried flounder and papaya salad.  States that he also drinks 3 gin drinks that were mixed with wine which is more than he normally drinks.  States that he woke up this morning with abdominal pain and nausea.  States that he has had 6 episodes of nonbilious nonbloody emesis.  Has continued to have nausea and abdominal "tenderness."  He denies diarrhea, fevers, chest pain or shortness of breath.  Denies lightheadedness or dizziness.  Denies urinary symptoms, penile discharge or testicular pain.  Denies rashes, skin yellowing.  HPI     Past Medical History:  Diagnosis Date   Anxiety    Depression    Hidradenitis suppurativa    Hyperlipidemia    Hypertension    Prediabetes     Patient Active Problem List   Diagnosis Date Noted   Insulin resistance    Testosterone deficiency 04/17/2016   Obesity (BMI 30.0-34.9) 10/11/2015   GERD 03/11/2015   Genital warts 12/05/2014   Mixed hyperlipidemia 09/28/2014   Medication management 09/28/2014   Vitamin D deficiency 09/28/2014   OSA (obstructive sleep apnea) 06/21/2014   Depression, controlled    Essential hypertension 06/09/2013    Past Surgical History:  Procedure Laterality Date   None         Family History  Problem Relation Age of Onset   Heart disease Maternal Grandmother        Died from bypass surgery   Heart disease Mother        Bicuspid aortic valve   Heart disease Sister        Bicuspid aortic valve   Colitis Sister    Diabetes Sister    Heart disease Father        Bicuspid aortic valve   Breast cancer Paternal Grandmother     Diabetes Paternal Grandmother    Esophageal cancer Paternal Grandfather    Colon cancer Paternal Aunt     Social History   Tobacco Use   Smoking status: Every Day    Packs/day: 0.60    Years: 10.00    Pack years: 6.00    Types: Cigarettes   Smokeless tobacco: Never  Vaping Use   Vaping Use: Never used  Substance Use Topics   Alcohol use: Yes    Comment: occ   Drug use: Not Currently    Home Medications Prior to Admission medications   Medication Sig Start Date End Date Taking? Authorizing Provider  amLODipine (NORVASC) 10 MG tablet TAKE HALF TO ONE TABLET BY MOUTH DAILY FOR BLOOD PRESSURE 04/05/21   Judd Gaudier, NP  Ascorbic Acid (VITAMIN C) 1000 MG tablet Take 1,000 mg by mouth daily.    [provider]  aspirin EC 81 MG tablet Take 81 mg by mouth 2 (two) times daily.    [provider]  cetirizine (ZYRTEC) 10 MG tablet Take 1 tablet (10 mg total) by mouth daily. Patient taking differently: Take 10 mg by mouth as needed. 10/20/16   Shirleen Schirmer, PA-C  cloNIDine (CATAPRES) 0.2 MG tablet Take 1 tablet by mouth 3 times a day for blood pressure 06/21/21  Judd Gaudier, NP  famotidine (PEPCID) 20 MG tablet Take  1 tablet  2 x /day  to Prevent Heartburn & Indigestion  /  Patient knows to take by mouth 08/17/21   Lucky Cowboy, MD  fish oil-omega-3 fatty acids 1000 MG capsule Take 1 g by mouth 2 (two) times daily.    [provider]  gabapentin (NEURONTIN) 100 MG capsule TAKE ONE CAPSULE BY MOUTH THREE TIMES DAILY AS NEEDED FOR PAIN 08/16/21   Judd Gaudier, NP  hydrochlorothiazide (HYDRODIURIL) 25 MG tablet Take 1 tablet by mouth daily for blood pressure & fluid retention/ankle swelling 06/21/21   Judd Gaudier, NP  hyoscyamine (LEVSIN SL) 0.125 MG SL tablet Dissolve  1 to 2 tablets under tongue3 to 4 x day if needed for Nausea, Vomiting, Cramping, Bloating  or Diarrhea 03/30/19   Lucky Cowboy, MD  lisinopril (ZESTRIL) 20 MG tablet Take  1  tablet  2 x /day  for BP 04/22/21   Lucky Cowboy, MD  LORazepam (ATIVAN) 1 MG tablet Take 1/2 to 1 tablet 2 to 3 x / day  ONLY if needed for Anxiety Attack & please try to limit to 5 days /week to avoid Addiction & Dementia 07/11/21   Lucky Cowboy, MD  Magnesium 400 MG TABS Take 400 mg by mouth daily.    [provider]  meloxicam (MOBIC) 15 MG tablet take half to one tablet by mouth daily with food for pain and inflammation **limit to 5 days/week to avoid kidney damage** 10/04/20   Judd Gaudier, NP  metFORMIN (GLUCOPHAGE-XR) 500 MG 24 hr tablet Take 1 to 2 tablets by mouth twice a day for diabetes 05/06/21   Judd Gaudier, NP  omeprazole (PRILOSEC) 20 MG capsule Take 1 capsule (20 mg total) by mouth 2 (two) times daily. 02/28/21 02/28/22  Elder Negus, NP  propranolol (INDERAL) 80 MG tablet TAKE ONE TABLET BY MOUTH THREE TIMES DAILY FOR BP 06/21/21   Judd Gaudier, NP  rosuvastatin (CRESTOR) 20 MG tablet Take 1 tablet by mouth once daily for cholesterol 06/21/21   Judd Gaudier, NP  sucralfate (CARAFATE) 1 g tablet Take 1 tablet (1 g total) by mouth 4 (four) times daily. 02/28/21 02/28/22  Elder Negus, NP  VITAMIN D PO Take 10,000 Units by mouth daily.    [provider]  zinc gluconate 50 MG tablet Take 50 mg by mouth daily.    [provider]    Allergies    Peanut-containing drug products, Ceclor [cefaclor], and Sulfa antibiotics  Review of Systems   Review of Systems  Constitutional:  Positive for appetite change. Negative for fever.  Cardiovascular:  Negative for chest pain.  Gastrointestinal:  Positive for abdominal pain, nausea and vomiting. Negative for abdominal distention, constipation and diarrhea.  All other systems reviewed and are negative.  Physical Exam Updated Vital Signs BP (!) 148/91 (BP Location: Left Arm)   Pulse 89   Temp 98.4 F (36.9 C) (Oral)   Resp 18   Ht 6\' 5"  (1.956 m)   Wt 130.2 kg   SpO2 99%   BMI 34.03 kg/m    Physical Exam Vitals and nursing note reviewed.  Constitutional:      General: He is not in acute distress.    Appearance: Normal appearance. He is ill-appearing.  HENT:     Head: Normocephalic and atraumatic.     Mouth/Throat:     Mouth: Mucous membranes are moist.     Pharynx: Oropharynx is clear.  Eyes:  General: No scleral icterus.    Extraocular Movements: Extraocular movements intact.     Pupils: Pupils are equal, round, and reactive to light.  Cardiovascular:     Rate and Rhythm: Normal rate and regular rhythm.     Pulses: Normal pulses.     Heart sounds: Normal heart sounds. No murmur heard. Pulmonary:     Effort: Pulmonary effort is normal. No respiratory distress.     Breath sounds: Normal breath sounds.  Abdominal:     General: Abdomen is protuberant. Bowel sounds are normal. There is no distension.     Palpations: Abdomen is soft.     Tenderness: There is generalized abdominal tenderness. There is no guarding or rebound.  Skin:    General: Skin is warm and dry.     Capillary Refill: Capillary refill takes less than 2 seconds.     Findings: No rash.  Neurological:     General: No focal deficit present.     Mental Status: He is alert and oriented to person, place, and time. Mental status is at baseline.  Psychiatric:        Mood and Affect: Mood normal.        Behavior: Behavior normal.        Thought Content: Thought content normal.        Judgment: Judgment normal.    ED Results / Procedures / Treatments   Labs (all labs ordered are listed, but only abnormal results are displayed) Labs Reviewed  COMPREHENSIVE METABOLIC PANEL - Abnormal; Notable for the following components:      Result Value   Potassium 3.3 (*)    Glucose, Bld 117 (*)    ALT 51 (*)    Alkaline Phosphatase 37 (*)    All other components within normal limits  CBC WITH DIFFERENTIAL/PLATELET - Abnormal; Notable for the following components:   WBC 10.9 (*)    MCHC 37.2 (*)    All  other components within normal limits  LIPASE, BLOOD    EKG None  Radiology No results found.  Procedures Procedures   Medications Ordered in ED Medications  sodium chloride 0.9 % bolus 1,000 mL (0 mLs Intravenous Stopped 09/18/21 1952)  ondansetron (ZOFRAN) injection 4 mg (4 mg Intravenous Given 09/18/21 1849)    ED Course  I have reviewed the triage vital signs and the nursing notes.  Pertinent labs & imaging results that were available during my care of the patient were reviewed by me and considered in my medical decision making (see chart for details).    MDM Rules/Calculators/A&P 35 year old male who presents to the emergency department for nausea and vomiting.  Likely secondary to benign infectious cause from food ingestion last night. Low risk for SBO as he is passing flatus, no abdominal surgeries, no distention or tympany on exam. No history of diabetes to not consider DKA. CMP with normal electrolytes, no sign of dehydration however rehydrated with 1L of IVF.  No chest pain or concern for ACS as the etiology of nausea and vomiting. Based on his history, exam and work-up low suspicion for pancreatitis, appendicitis, or hepatobiliary pathology. He was given 1L of IVF with 4 mg of Zofran.  Symptomatically felt improved after intervention. He was p.o. challenged with food and water and tolerated.  We will discharge him with prescription for Zofran to use over the next 24 to 48 hours as he is recovering.  Encouraged to push p.o. fluids and rest.  Given return precautions if nausea and vomiting  intractable to Zofran and fluids.  All questions answered.  He is hemodynamically stable and safe for discharge.  Final Clinical Impression(s) / ED Diagnoses Final diagnoses:  Non-intractable vomiting with nausea, unspecified vomiting type    Rx / DC Orders ED Discharge Orders          Ordered    ondansetron (ZOFRAN ODT) 4 MG disintegrating tablet  Every 8 hours PRN         09/18/21 2044             Cristopher Peru, PA-C 09/18/21 2214    Gwyneth Sprout, MD 09/20/21 1319

## 2021-10-21 ENCOUNTER — Other Ambulatory Visit: Payer: Self-pay | Admitting: Adult Health

## 2021-10-21 ENCOUNTER — Other Ambulatory Visit: Payer: Self-pay | Admitting: Internal Medicine

## 2021-10-21 DIAGNOSIS — F419 Anxiety disorder, unspecified: Secondary | ICD-10-CM

## 2021-11-13 ENCOUNTER — Other Ambulatory Visit: Payer: Self-pay | Admitting: Adult Health

## 2021-11-13 DIAGNOSIS — I10 Essential (primary) hypertension: Secondary | ICD-10-CM

## 2021-11-13 DIAGNOSIS — E782 Mixed hyperlipidemia: Secondary | ICD-10-CM

## 2021-11-25 ENCOUNTER — Other Ambulatory Visit: Payer: Self-pay | Admitting: Internal Medicine

## 2021-11-25 DIAGNOSIS — F419 Anxiety disorder, unspecified: Secondary | ICD-10-CM

## 2021-12-23 ENCOUNTER — Encounter: Payer: Self-pay | Admitting: Internal Medicine

## 2021-12-23 ENCOUNTER — Other Ambulatory Visit: Payer: Self-pay | Admitting: Internal Medicine

## 2021-12-23 MED ORDER — PSEUDOEPHEDRINE HCL ER 120 MG PO TB12: ORAL_TABLET | ORAL | 3 refills | Status: DC

## 2021-12-23 MED ORDER — DEXAMETHASONE 4 MG PO TABS: ORAL_TABLET | ORAL | 0 refills | Status: DC

## 2021-12-23 MED ORDER — PROMETHAZINE-DM 6.25-15 MG/5ML PO SYRP: ORAL_SOLUTION | ORAL | 1 refills | Status: DC

## 2022-01-27 NOTE — Progress Notes (Signed)
FOLLOW UP  Assessment and Plan:    Essential hypertension Well controlled with current medications  Monitor blood pressure at home; patient to call if consistently greater than 130/80 Continue DASH diet.   Reminder to go to the ER if any CP, SOB, nausea, dizziness, severe HA, changes vision/speech, left arm numbness and tingling and jaw pain. -     CBC with Differential/Platelet -     hydrochlorothiazide (HYDRODIURIL) 25 MG tablet; TAKE ONE TABLET BY MOUTH EVERY DAY FOR BLOOD PRESSURE AND FLUID RETENTION AND OR ANKLE SWELLING  Hyperlipidemia, mixed Currently at LDL goal; lifestyle discussed for trigs, consider fenofibrate if trending up Continue low cholesterol diet and exercise.  -     COMPLETE METABOLIC PANEL WITH GFR -     Lipid panel  Abnormal glucose Continue medication: metformin  Continue diet and exercise.  Perform daily foot/skin check, notify office of any concerning changes.  A1C annually at CPE; has been well controlled; monitor weight, serum glucose  Class 2 severe obesity due to excess calories with serious comorbidity and body mass index (BMI) of 35.0 to 35.9 in adult [facility]) Long discussion about weight loss, diet, and exercise Recommended diet heavy in fruits and veggies and low in animal meats, cheeses, and dairy products, appropriate calorie intake Discussed ideal weight for height  Will follow up in 3 months  Gastroesophageal reflux disease, unspecified whether esophagitis present Continue Famotidine daily with Omeprazole as needed Make dietary modifications -     Magnesium  Anxiety tension state Continue behavior modifications, diet and exercise -     LORazepam (ATIVAN) 1 MG tablet; TAKE HALF TO ONE TABLET BY MOUTH 2 TO 3 TIMES A DAY ONLY IF NEEDED FOR ANXIETY ATTACK AND LIMIT TO 5 DAYS/WEEK TO AVOID ADDICTION AND DEMENTIA   Testosterone deficiency Continue zinc supplementation -     Testosterone  Medication management Continued  Screening for HIV  (human immunodeficiency virus) -     HIV Antibody (routine testing w rflx)     Continue diet and meds as discussed. Further disposition pending results of labs. Discussed med's effects and SE's.   Over 30 minutes of exam, counseling, chart review, and critical decision making was performed.   Future Appointments  Date Time Provider Department Center  07/23/2022  3:00 PM Lucky Cowboy, MD [facility] None    ----------------------------------------------------------------------------------------------------------------------  HPI 36 y.o. male  presents for 3 month follow up on hypertension, cholesterol, glucose management, obesity, GERD and vitamin D deficiency.   He had previously been on Descovy at [facility] but is no longer taking.  Wants to have HIV test today  He quit smoking 3 months ago, had smoked 1 pack per day for 16 years.  He has depression with elements of anxiety on  ativan, 1 mg - takes 1/2-1 tab twice daily, has been cutting down, down from three daily. He was on celexa caused ED and retrograde ejaculation.   he has a diagnosis of GERD which is currently managed by omeprazole 20 mg  he reports symptoms is currently well controlled, and denies breakthrough reflux, burning in chest, hoarseness or cough.    BMI is Body mass index is 35.2 kg/m., he has been working on diet and exercise. He does walk the floor at lot at work. Has not been working on his diet  Wt Readings from Last 3 Encounters:  01/28/22 296 lb 12.8 oz (134.6 kg)  09/18/21 287 lb (130.2 kg)  07/23/21 287 lb (130.2 kg)   His  blood pressure has been controlled at home, today their BP is BP: 108/62 BP Readings from Last 3 Encounters:  01/28/22 108/62  09/18/21 135/78  07/23/21 124/70     He does workout. He denies chest pain, shortness of breath, dizziness.   He is on cholesterol medication Rosuvastatin 20mg   and denies myalgias. His LDL cholesterol is at goal, trigs remain elevated. The  cholesterol last visit was:   Lab Results  Component Value Date   CHOL 114 07/23/2021   HDL 30 (L) 07/23/2021   LDLCALC 56 07/23/2021   TRIG 226 (H) 07/23/2021   CHOLHDL 3.8 07/23/2021    He has been working on diet and exercise for prediabetes/insulin resistance and has been on metformin since 2015, and denies increased appetite, nausea, paresthesia of the feet, polydipsia, polyuria, visual disturbances, vomiting and weight loss. Last A1C in the office was:  Lab Results  Component Value Date   HGBA1C 4.6 07/23/2021   He has a history of testosterone deficiency and formerly on supplement but stopped due consistent benefit. He is on zinc 50 mg daily.  Lab Results  Component Value Date   TESTOSTERONE 213 (L) 07/23/2021   Patient is on Vitamin D supplement.   Lab Results  Component Value Date   VD25OH 74 07/23/2021     Patient is on a daily B complex  Lab Results  Component Value Date   VITAMINB12 629 07/23/2021      Current Medications:  Current Outpatient Medications on File Prior to Visit  Medication Sig   amLODipine (NORVASC) 10 MG tablet TAKE HALF TO ONE TABLET BY MOUTH DAILY FOR BLOOD PRESSURE   Ascorbic Acid (VITAMIN C) 1000 MG tablet Take 1,000 mg by mouth daily.   aspirin EC 81 MG tablet Take 81 mg by mouth 2 (two) times daily.   cetirizine (ZYRTEC) 10 MG tablet Take 1 tablet (10 mg total) by mouth daily. (Patient taking differently: Take 10 mg by mouth as needed.)   cloNIDine (CATAPRES) 0.2 MG tablet TAKE ONE TABLET BY MOUTH THREE TIMES A DAY FOR BLOOD PRESSURE   famotidine (PEPCID) 20 MG tablet Take  1 tablet  2 x /day  to Prevent Heartburn & Indigestion  /  Patient knows to take by mouth   fish oil-omega-3 fatty acids 1000 MG capsule Take 1 g by mouth 2 (two) times daily.   gabapentin (NEURONTIN) 100 MG capsule TAKE ONE CAPSULE BY MOUTH THREE TIMES DAILY AS NEEDED FOR PAIN   hydrochlorothiazide (HYDRODIURIL) 25 MG tablet TAKE ONE TABLET BY MOUTH EVERY DAY FOR BLOOD  PRESSURE AND FLUID RETENTION AND OR ANKLE SWELLING   hyoscyamine (LEVSIN SL) 0.125 MG SL tablet Dissolve  1 to 2 tablets under tongue3 to 4 x day if needed for Nausea, Vomiting, Cramping, Bloating  or Diarrhea   lisinopril (ZESTRIL) 20 MG tablet Take  1 tablet  2 x /day  for BP   LORazepam (ATIVAN) 1 MG tablet TAKE HALF TO ONE TABLET BY MOUTH 2 TO 3 TIMES A DAY ONLY IF NEEDED FOR ANXIETY ATTACK AND LIMIT TO 5 DAYS/WEEK TO AVOID ADDICTION AND DEMENTIA   Magnesium 400 MG TABS Take 400 mg by mouth daily.   meloxicam (MOBIC) 15 MG tablet take half to one tablet by mouth daily with food for pain and inflammation **limit to 5 days/week to avoid kidney damage**   metFORMIN (GLUCOPHAGE-XR) 500 MG 24 hr tablet Take 1 to 2 tablets by mouth twice a day for diabetes   omeprazole (PRILOSEC)  20 MG capsule Take 1 capsule (20 mg total) by mouth 2 (two) times daily.   ondansetron (ZOFRAN ODT) 4 MG disintegrating tablet Take 1 tablet (4 mg total) by mouth every 8 (eight) hours as needed for nausea or vomiting.   propranolol (INDERAL) 80 MG tablet TAKE ONE TABLET BY MOUTH THREE TIMES A DAY FOR BLOOD PRESSURE   rosuvastatin (CRESTOR) 20 MG tablet TAKE ONE TABLET BY MOUTH ONE TIME DAILY FOR CHOLESTEROL   sucralfate (CARAFATE) 1 g tablet Take 1 tablet (1 g total) by mouth 4 (four) times daily.   VITAMIN D PO Take 10,000 Units by mouth daily.   zinc gluconate 50 MG tablet Take 50 mg by mouth daily.   dexamethasone (DECADRON) 4 MG tablet Take 1 tab 3 x /day for 2 days,      then 2 x /day for 2  Days,     then 1 tab daily (Patient not taking: Reported on 01/28/2022)   promethazine-dextromethorphan (PROMETHAZINE-DM) 6.25-15 MG/5ML syrup Take 1 tsp every 4 hours if needed for cough (Patient not taking: Reported on 01/28/2022)   pseudoephedrine (SUDAFED) 120 MG 12 hr tablet Take  1 tablet  2 x /day (every 12 hours)  for Sinus & Chest Congestion (Patient not taking: Reported on 01/28/2022)   No current facility-administered  medications on file prior to visit.     Allergies:  Allergies  Allergen Reactions   Peanut-Containing Drug Products Anaphylaxis   Ceclor [Cefaclor] Hives and Swelling   Sulfa Antibiotics Hives     Medical History:  Past Medical History:  Diagnosis Date   Anxiety    Depression    Hidradenitis suppurativa    Hyperlipidemia    Hypertension    Prediabetes    Family history- Reviewed and unchanged Social history- Reviewed and unchanged   Review of Systems:  Review of Systems  Constitutional:  Negative for chills, fever, malaise/fatigue and weight loss.  HENT:  Negative for congestion, hearing loss and tinnitus.   Eyes:  Negative for blurred vision and double vision.  Respiratory:  Negative for cough, shortness of breath and wheezing.   Cardiovascular:  Negative for chest pain, palpitations, orthopnea, claudication and leg swelling.  Gastrointestinal:  Negative for abdominal pain, blood in stool, constipation, diarrhea, heartburn, melena, nausea and vomiting.  Genitourinary: Negative.   Musculoskeletal:  Negative for falls, joint pain and myalgias.  Skin:  Negative for rash.  Neurological:  Negative for dizziness, tingling, tremors, sensory change, loss of consciousness, weakness and headaches.  Endo/Heme/Allergies:  Negative for polydipsia.  Psychiatric/Behavioral:  Negative for depression, memory loss, substance abuse and suicidal ideas. The patient is nervous/anxious. The patient does not have insomnia.   All other systems reviewed and are negative.    Physical Exam: BP 108/62    Pulse 79    Temp 97.7 F (36.5 C)    Wt 296 lb 12.8 oz (134.6 kg)    SpO2 98%    BMI 35.20 kg/m  Wt Readings from Last 3 Encounters:  01/28/22 296 lb 12.8 oz (134.6 kg)  09/18/21 287 lb (130.2 kg)  07/23/21 287 lb (130.2 kg)   General Appearance: Well nourished, in no apparent distress. Eyes: PERRLA, EOMs, conjunctiva no swelling or erythema Sinuses: No Frontal/maxillary  tenderness ENT/Mouth: Ext aud canals clear, TMs without erythema, bulging. No erythema, swelling, or exudate on post pharynx.  Tonsils not swollen or erythematous. Hearing normal.  Neck: Supple, thyroid normal.  Respiratory: Respiratory effort normal, BS equal bilaterally without rales, rhonchi, wheezing or  stridor.  Cardio: RRR with no MRGs. Brisk peripheral pulses without edema.  Abdomen: Soft, + BS.  Non tender, no guarding, rebound, hernias, masses. Lymphatics: Non tender without lymphadenopathy.  Musculoskeletal: Full ROM, 5/5 strength, Normal gait Skin: Warm, dry without rashes, lesions, ecchymosis.  Neuro: Cranial nerves intact. No cerebellar symptoms.  Psych: Awake and oriented X 3, normal affect, Insight and Judgment appropriate.    Revonda HumphreyANA W Kodi Steil, NP 9:44 AM [facility]

## 2022-01-28 ENCOUNTER — Other Ambulatory Visit: Payer: Self-pay

## 2022-01-28 ENCOUNTER — Ambulatory Visit (INDEPENDENT_AMBULATORY_CARE_PROVIDER_SITE_OTHER): Payer: 59 | Admitting: Nurse Practitioner

## 2022-01-28 ENCOUNTER — Other Ambulatory Visit: Payer: Self-pay | Admitting: Nurse Practitioner

## 2022-01-28 ENCOUNTER — Encounter: Payer: Self-pay | Admitting: Nurse Practitioner

## 2022-01-28 VITALS — BP 108/62 | HR 79 | Temp 97.7°F | Wt 296.8 lb

## 2022-01-28 DIAGNOSIS — K219 Gastro-esophageal reflux disease without esophagitis: Secondary | ICD-10-CM

## 2022-01-28 DIAGNOSIS — E349 Endocrine disorder, unspecified: Secondary | ICD-10-CM

## 2022-01-28 DIAGNOSIS — R7309 Other abnormal glucose: Secondary | ICD-10-CM

## 2022-01-28 DIAGNOSIS — F419 Anxiety disorder, unspecified: Secondary | ICD-10-CM

## 2022-01-28 DIAGNOSIS — I10 Essential (primary) hypertension: Secondary | ICD-10-CM

## 2022-01-28 DIAGNOSIS — Z6835 Body mass index (BMI) 35.0-35.9, adult: Secondary | ICD-10-CM

## 2022-01-28 DIAGNOSIS — Z79899 Other long term (current) drug therapy: Secondary | ICD-10-CM

## 2022-01-28 DIAGNOSIS — F32A Depression, unspecified: Secondary | ICD-10-CM

## 2022-01-28 DIAGNOSIS — E782 Mixed hyperlipidemia: Secondary | ICD-10-CM

## 2022-01-28 DIAGNOSIS — Z114 Encounter for screening for human immunodeficiency virus [HIV]: Secondary | ICD-10-CM

## 2022-01-28 DIAGNOSIS — E669 Obesity, unspecified: Secondary | ICD-10-CM

## 2022-01-28 DIAGNOSIS — E8881 Metabolic syndrome: Secondary | ICD-10-CM

## 2022-01-28 DIAGNOSIS — G4733 Obstructive sleep apnea (adult) (pediatric): Secondary | ICD-10-CM

## 2022-01-28 MED ORDER — HYDROCHLOROTHIAZIDE 25 MG PO TABS: ORAL_TABLET | ORAL | 0 refills | Status: DC

## 2022-01-28 MED ORDER — LORAZEPAM 1 MG PO TABS: ORAL_TABLET | ORAL | 0 refills | Status: DC

## 2022-01-29 LAB — COMPLETE METABOLIC PANEL WITH GFR
AG Ratio: 2.6 (calc) — ABNORMAL HIGH (ref 1.0–2.5)
ALT: 51 U/L — ABNORMAL HIGH (ref 9–46)
AST: 32 U/L (ref 10–40)
Albumin: 4.7 g/dL (ref 3.6–5.1)
Alkaline phosphatase (APISO): 44 U/L (ref 36–130)
BUN: 12 mg/dL (ref 7–25)
CO2: 30 mmol/L (ref 20–32)
Calcium: 9.2 mg/dL (ref 8.6–10.3)
Chloride: 104 mmol/L (ref 98–110)
Creat: 0.77 mg/dL (ref 0.60–1.26)
Globulin: 1.8 g/dL (calc) — ABNORMAL LOW (ref 1.9–3.7)
Glucose, Bld: 106 mg/dL — ABNORMAL HIGH (ref 65–99)
Potassium: 4.5 mmol/L (ref 3.5–5.3)
Sodium: 140 mmol/L (ref 135–146)
Total Bilirubin: 0.5 mg/dL (ref 0.2–1.2)
Total Protein: 6.5 g/dL (ref 6.1–8.1)
eGFR: 120 mL/min/{1.73_m2} (ref >=60)

## 2022-01-29 LAB — CBC WITH DIFFERENTIAL/PLATELET
Absolute Monocytes: 507 cells/uL (ref 200–950)
Basophils Absolute: 59 cells/uL (ref 0–200)
Basophils Relative: 0.9 %
Eosinophils Absolute: 111 cells/uL (ref 15–500)
Eosinophils Relative: 1.7 %
HCT: 40 % (ref 38.5–50.0)
Hemoglobin: 13.8 g/dL (ref 13.2–17.1)
Lymphs Abs: 2483 cells/uL (ref 850–3900)
MCH: 31 pg (ref 27.0–33.0)
MCHC: 34.5 g/dL (ref 32.0–36.0)
MCV: 89.9 fL (ref 80.0–100.0)
MPV: 10.7 fL (ref 7.5–12.5)
Monocytes Relative: 7.8 %
Neutro Abs: 3341 cells/uL (ref 1500–7800)
Neutrophils Relative %: 51.4 %
Platelets: 185 10*3/uL (ref 140–400)
RBC: 4.45 10*6/uL (ref 4.20–5.80)
RDW: 13 % (ref 11.0–15.0)
Total Lymphocyte: 38.2 %
WBC: 6.5 10*3/uL (ref 3.8–10.8)

## 2022-01-29 LAB — TESTOSTERONE: Testosterone: 237 ng/dL — ABNORMAL LOW (ref 250–827)

## 2022-01-29 LAB — LIPID PANEL
Cholesterol: 94 mg/dL (ref <200)
HDL: 32 mg/dL — ABNORMAL LOW (ref >=40)
LDL Cholesterol (Calc): 42 mg/dL (calc)
Non-HDL Cholesterol (Calc): 62 mg/dL (calc) (ref <130)
Total CHOL/HDL Ratio: 2.9 (calc) (ref <5.0)
Triglycerides: 110 mg/dL (ref <150)

## 2022-01-29 LAB — MAGNESIUM: Magnesium: 2.3 mg/dL (ref 1.5–2.5)

## 2022-01-29 LAB — HIV ANTIBODY (ROUTINE TESTING W REFLEX): HIV 1&2 Ab, 4th Generation: NONREACTIVE

## 2022-02-12 ENCOUNTER — Ambulatory Visit (INDEPENDENT_AMBULATORY_CARE_PROVIDER_SITE_OTHER): Payer: 59 | Admitting: Pharmacist

## 2022-02-12 ENCOUNTER — Other Ambulatory Visit: Payer: Self-pay

## 2022-02-12 DIAGNOSIS — Z113 Encounter for screening for infections with a predominantly sexual mode of transmission: Secondary | ICD-10-CM

## 2022-02-12 DIAGNOSIS — Z79899 Other long term (current) drug therapy: Secondary | ICD-10-CM

## 2022-02-12 MED ORDER — DESCOVY 200-25 MG PO TABS: 1.0000 | ORAL_TABLET | Freq: Every day | ORAL | 2 refills | Status: DC

## 2022-02-12 NOTE — Addendum Note (Signed)
Addended by: Leatrice Jewels on: 02/12/2022 10:09 AM   Modules accepted: Orders

## 2022-02-12 NOTE — Progress Notes (Signed)
Date:  02/12/2022   HPI: Spencer Love is a 36 y.o. male who presents to the [facility] pharmacy clinic for HIV PrEP follow-up.  Insured   [x]    Uninsured  []    Patient Active Problem List   Diagnosis Date Noted   Insulin resistance    Testosterone deficiency 04/17/2016   Obesity (BMI 30.0-34.9) 10/11/2015   GERD 03/11/2015   Genital warts 12/05/2014   Mixed hyperlipidemia 09/28/2014   Medication management 09/28/2014   Vitamin D deficiency 09/28/2014   OSA (obstructive sleep apnea) 06/21/2014   Depression, controlled    Essential hypertension 06/09/2013    Patient's Medications  New Prescriptions   No medications on file  Previous Medications   AMLODIPINE (NORVASC) 10 MG TABLET    TAKE HALF TO ONE TABLET BY MOUTH DAILY FOR BLOOD PRESSURE   ASCORBIC ACID (VITAMIN C) 1000 MG TABLET    Take 1,000 mg by mouth daily.   ASPIRIN EC 81 MG TABLET    Take 81 mg by mouth 2 (two) times daily.   CLONIDINE (CATAPRES) 0.2 MG TABLET    TAKE ONE TABLET BY MOUTH THREE TIMES A DAY FOR BLOOD PRESSURE   FAMOTIDINE (PEPCID) 20 MG TABLET    Take  1 tablet  2 x /day  to Prevent Heartburn & Indigestion  /  Patient knows to take by mouth   FISH OIL-OMEGA-3 FATTY ACIDS 1000 MG CAPSULE    Take 1 g by mouth 2 (two) times daily.   GABAPENTIN (NEURONTIN) 100 MG CAPSULE    TAKE ONE CAPSULE BY MOUTH THREE TIMES DAILY AS NEEDED FOR PAIN   HYDROCHLOROTHIAZIDE (HYDRODIURIL) 25 MG TABLET    TAKE ONE TABLET BY MOUTH EVERY DAY FOR BLOOD PRESSURE AND FLUID RETENTION AND OR ANKLE SWELLING   HYOSCYAMINE (LEVSIN SL) 0.125 MG SL TABLET    Dissolve  1 to 2 tablets under tongue3 to 4 x day if needed for Nausea, Vomiting, Cramping, Bloating  or Diarrhea   LISINOPRIL (ZESTRIL) 20 MG TABLET    Take  1 tablet  2 x /day  for BP   LORAZEPAM (ATIVAN) 1 MG TABLET    TAKE HALF TO ONE TABLET BY MOUTH 2 TO 3 TIMES A DAY ONLY IF NEEDED FOR ANXIETY ATTACK AND LIMIT TO 5 DAYS/WEEK TO AVOID ADDICTION AND DEMENTIA - 60 day supply   MAGNESIUM  400 MG TABS    Take 400 mg by mouth daily.   MELOXICAM (MOBIC) 15 MG TABLET    take half to one tablet by mouth daily with food for pain and inflammation **limit to 5 days/week to avoid kidney damage**   METFORMIN (GLUCOPHAGE-XR) 500 MG 24 HR TABLET    Take 1 to 2 tablets by mouth twice a day for diabetes   OMEPRAZOLE (PRILOSEC) 20 MG CAPSULE    Take 1 capsule (20 mg total) by mouth 2 (two) times daily.   ONDANSETRON (ZOFRAN ODT) 4 MG DISINTEGRATING TABLET    Take 1 tablet (4 mg total) by mouth every 8 (eight) hours as needed for nausea or vomiting.   PROPRANOLOL (INDERAL) 80 MG TABLET    TAKE ONE TABLET BY MOUTH THREE TIMES A DAY FOR BLOOD PRESSURE   ROSUVASTATIN (CRESTOR) 20 MG TABLET    TAKE ONE TABLET BY MOUTH ONE TIME DAILY FOR CHOLESTEROL   SUCRALFATE (CARAFATE) 1 G TABLET    Take 1 tablet (1 g total) by mouth 4 (four) times daily.   VITAMIN D PO    Take 10,000  Units by mouth daily.   ZINC GLUCONATE 50 MG TABLET    Take 50 mg by mouth daily.  Modified Medications   No medications on file  Discontinued Medications   No medications on file    Allergies: Allergies  Allergen Reactions   Peanut-Containing Drug Products Anaphylaxis   Ceclor [Cefaclor] Hives and Swelling   Sulfa Antibiotics Hives    Past Medical History: Past Medical History:  Diagnosis Date   Anxiety    Depression    Hidradenitis suppurativa    Hyperlipidemia    Hypertension    Prediabetes     Social History: Social History   Socioeconomic History   Marital status: Single    Spouse name: Not on file   Number of children: 0   Years of education: Not on file   Highest education level: Not on file  Occupational History   Occupation: Merchandiser, retail    Employer: [employer]  Tobacco Use   Smoking status: Every Day    Packs/day: 0.60    Years: 10.00    Pack years: 6.00    Types: Cigarettes   Smokeless tobacco: Never  Vaping Use   Vaping Use: Never used  Substance and Sexual Activity   Alcohol use: Yes     Comment: occ   Drug use: Not Currently   Sexual activity: Not on file  Other Topics Concern   Not on file  Social History Narrative   Lives alone.     Social Determinants of Health   Financial Resource Strain: Not on file  Food Insecurity: Not on file  Transportation Needs: Not on file  Physical Activity: Not on file  Stress: Not on file  Social Connections: Not on file    [facility] HIV PREP FLOWSHEET RESULTS 01/24/2020 10/26/2019 10/26/2019 07/27/2019 02/16/2019 11/17/2018 10/20/2018  Insurance Status - Insured Insured Insured Insured Insured Insured  How did you hear? - - - - - - primary  Gender at birth Male Male Male Male Male Male Male  Gender identity cis-Male cis-Male cis-Male cis-Male cis-Male cis-Male cis-Male  Risk for HIV - Condomless vaginal or anal intercourse;>5 partners in past 6 mos (regardless of condom use) Condomless vaginal or anal intercourse;>5 partners in past 6 mos (regardless of condom use) Condomless vaginal or anal intercourse;>5 partners in past 6 mos (regardless of condom use) >5 partners in past 6 mos (regardless of condom use);Condomless vaginal or anal intercourse;Hx of STI Condomless vaginal or anal intercourse;>5 partners in past 6 mos (regardless of condom use) Condomless vaginal or anal intercourse  Sex Partners Men only Men only Men only Men only Men only Men only Men only  # sex partners past 3-6 mos 1-3 10-12 10-12 4-6 7-9 7-9 4-6  Sex activity preferences Insertive and receptive;Oral Insertive and receptive Insertive Insertive Insertive Insertive;Oral Insertive;Oral  Condom use - No No No No Yes No  % condom use - - - - - 10 -  Partners genders and ages - - - - - - M 25-29;M 20-24;M 15-19  Treated for STI? No No No No No No Yes  HIV symptoms? N/A - - N/A N/A Sore throat Sore throat  PrEP Eligibility Substantial risk for HIV Substantial risk for HIV Substantial risk for HIV Substantial risk for HIV Substantial risk for HIV HIV negative;CrCl >60  ml/min;Substantial risk for HIV HIV negative;CrCl >60 ml/min;Substantial risk for HIV    Labs:  SCr: Lab Results  Component Value Date   CREATININE 0.77 01/28/2022   CREATININE 0.86  09/18/2021   CREATININE 0.92 07/23/2021   CREATININE 0.86 05/09/2021   CREATININE 0.91 02/28/2021   HIV Lab Results  Component Value Date   HIV NON-REACTIVE 01/28/2022   HIV NON-REACTIVE 07/23/2021   HIV NON-REACTIVE 12/11/2020   HIV NON-REACTIVE 01/24/2020   HIV NON-REACTIVE 10/26/2019   Hepatitis B Lab Results  Component Value Date   HEPBSAB BORDERLINE (A) 10/20/2018   HEPBSAG NON-REACTIVE 10/20/2018   Hepatitis C Lab Results  Component Value Date   HEPCAB NON-REACTIVE 10/20/2018   Hepatitis A Lab Results  Component Value Date   HAV NON-REACTIVE 10/20/2018   RPR and STI Lab Results  Component Value Date   LABRPR NON-REACTIVE 10/26/2019   LABRPR NON-REACTIVE 07/26/2019   LABRPR NON-REACTIVE 10/13/2018   LABRPR NON REAC 04/15/2017   LABRPR NON REAC 03/09/2017    STI Results GC GC CT CT  10/26/2019 Negative - Negative -  10/26/2019 Negative - Negative -  11/17/2018 Negative - Negative -  11/17/2018 Negative - Negative -  10/20/2018 Negative - Negative -  03/09/2017 - CANCELED - CANCELED  03/09/2017 - CANCELED - -    Assessment: Said is here today to re-establish HIV PrEP care. He was last seen in January 2021 and was doing well at that time. He has been in a steady relationship since he last saw me and stopped taking the Descovy because of this. He has started to have other partners, so he wishes to restart PrEP. He was last tested for HIV at his PCP's office on 01/28/22 and was negative. He has had 2 partners since being tested and engages in receptive, insertive, and oral sex. Last STI screening was in October 2020 and was negative.  He remains employed with [employer] and has their [employer]. He does not like filling his Rx at [facility] as he feels everyone is in his  business, but it is required by his insurance. Screened for acute HIV symptoms such as fatigue, muscle aches, rash, sore throat, lymphadenopathy, headache, night sweats, nausea/vomiting/diarrhea, and fever. Denies any symptoms.  Reviewed his medications and they are all current and updated. Will recheck all labs today and see him back in 3 months.   * Of note, patient only feels comfortable seeing me in clinic and does not wish to see students or residents.*  Plan: - HIV antibody, RPR, urine/rectal/pharyngeal GC/CT swabs for cytology today - Descovy x 3 months if HIV negative - F/u with me in 3 months on 5/3 at 915am  Aureliano Oshields L. Daniela Hernan, PharmD, BCIDP, AAHIVP, CPP Clinical Pharmacist Practitioner Infectious Diseases Clinical Pharmacist [facility] 02/12/2022, 9:28 AM

## 2022-02-13 ENCOUNTER — Other Ambulatory Visit: Payer: Self-pay | Admitting: Pharmacist

## 2022-02-13 ENCOUNTER — Encounter: Payer: Self-pay | Admitting: Pharmacist

## 2022-02-13 DIAGNOSIS — Z79899 Other long term (current) drug therapy: Secondary | ICD-10-CM

## 2022-02-13 LAB — CT/NG RNA, TMA RECTAL
Chlamydia Trachomatis RNA: NOT DETECTED
Neisseria Gonorrhoeae RNA: NOT DETECTED

## 2022-02-13 LAB — RPR: RPR Ser Ql: NONREACTIVE

## 2022-02-13 LAB — GC/CHLAMYDIA PROBE, AMP (THROAT)
Chlamydia trachomatis RNA: NOT DETECTED
Neisseria gonorrhoeae RNA: NOT DETECTED

## 2022-02-13 LAB — HIV ANTIBODY (ROUTINE TESTING W REFLEX): HIV 1&2 Ab, 4th Generation: NONREACTIVE

## 2022-02-13 MED ORDER — DESCOVY 200-25 MG PO TABS: 1.0000 | ORAL_TABLET | Freq: Every day | ORAL | 2 refills | Status: DC

## 2022-02-14 ENCOUNTER — Other Ambulatory Visit (HOSPITAL_COMMUNITY): Payer: Self-pay

## 2022-02-17 ENCOUNTER — Other Ambulatory Visit: Payer: Self-pay | Admitting: Adult Health

## 2022-02-17 ENCOUNTER — Other Ambulatory Visit: Payer: Self-pay | Admitting: Nurse Practitioner

## 2022-02-17 DIAGNOSIS — E782 Mixed hyperlipidemia: Secondary | ICD-10-CM

## 2022-02-18 ENCOUNTER — Encounter: Payer: Self-pay | Admitting: Pharmacist

## 2022-02-18 ENCOUNTER — Other Ambulatory Visit: Payer: Self-pay | Admitting: Pharmacist

## 2022-02-18 DIAGNOSIS — Z79899 Other long term (current) drug therapy: Secondary | ICD-10-CM

## 2022-02-18 MED ORDER — EMTRICITABINE-TENOFOVIR DF 200-300 MG PO TABS: 1.0000 | ORAL_TABLET | Freq: Every day | ORAL | 2 refills | Status: DC

## 2022-02-18 NOTE — Addendum Note (Signed)
Addended by: Robinette Haines on: 02/18/2022 12:17 PM   Modules accepted: Orders

## 2022-02-28 ENCOUNTER — Other Ambulatory Visit: Payer: Self-pay | Admitting: Nurse Practitioner

## 2022-02-28 DIAGNOSIS — I10 Essential (primary) hypertension: Secondary | ICD-10-CM

## 2022-03-04 ENCOUNTER — Encounter: Payer: Self-pay | Admitting: Internal Medicine

## 2022-03-07 ENCOUNTER — Other Ambulatory Visit: Payer: Self-pay | Admitting: Nurse Practitioner

## 2022-03-19 ENCOUNTER — Encounter: Payer: Self-pay | Admitting: Pharmacist

## 2022-03-20 ENCOUNTER — Other Ambulatory Visit: Payer: Self-pay

## 2022-03-20 ENCOUNTER — Other Ambulatory Visit (HOSPITAL_COMMUNITY)
Admission: RE | Admit: 2022-03-20 | Discharge: 2022-03-20 | Disposition: A | Discharge status: Home / Self Care | Payer: 59 | Source: Ambulatory Visit | Attending: Infectious Disease | Admitting: Infectious Disease

## 2022-03-20 ENCOUNTER — Ambulatory Visit (INDEPENDENT_AMBULATORY_CARE_PROVIDER_SITE_OTHER): Payer: 59 | Admitting: Pharmacist

## 2022-03-20 ENCOUNTER — Other Ambulatory Visit: Payer: Self-pay | Admitting: Pharmacist

## 2022-03-20 DIAGNOSIS — Z113 Encounter for screening for infections with a predominantly sexual mode of transmission: Secondary | ICD-10-CM

## 2022-03-20 DIAGNOSIS — Z202 Contact with and (suspected) exposure to infections with a predominantly sexual mode of transmission: Secondary | ICD-10-CM

## 2022-03-20 MED ORDER — DOXYCYCLINE HYCLATE 100 MG PO TABS: 100.0000 mg | ORAL_TABLET | Freq: Two times a day (BID) | ORAL | 0 refills | Status: AC

## 2022-03-20 MED ORDER — CEFTRIAXONE SODIUM 500 MG IJ SOLR
500.0000 mg | Freq: Once | INTRAMUSCULAR | Status: AC
  Administered 2022-03-20: 500 mg via INTRAMUSCULAR

## 2022-03-20 NOTE — Progress Notes (Signed)
? ?03/20/2022 ? ?HPI: Spencer Love is a 36 y.o. male who presents to the [facility] today for STI testing. ? ?Patient Active Problem List  ? Diagnosis Date Noted  ? Insulin resistance   ? Testosterone deficiency 04/17/2016  ? Obesity (BMI 30.0-34.9) 10/11/2015  ? GERD 03/11/2015  ? Genital warts 12/05/2014  ? Mixed hyperlipidemia 09/28/2014  ? Medication management 09/28/2014  ? Vitamin D deficiency 09/28/2014  ? OSA (obstructive sleep apnea) 06/21/2014  ? Depression, controlled   ? Essential hypertension 06/09/2013  ? ? ?Patient's Medications  ?New Prescriptions  ? No medications on file  ?Previous Medications  ? AMLODIPINE (NORVASC) 10 MG TABLET    TAKE HALF TO ONE TABLET BY MOUTH DAILY FOR BLOOD PRESSURE  ? ASCORBIC ACID (VITAMIN C) 1000 MG TABLET    Take 1,000 mg by mouth daily.  ? ASPIRIN EC 81 MG TABLET    Take 81 mg by mouth 2 (two) times daily.  ? CLONIDINE (CATAPRES) 0.2 MG TABLET    TAKE ONE TABLET BY MOUTH THREE TIMES A DAY FOR BLOOD PRESSURE  ? EMTRICITABINE-TENOFOVIR (TRUVADA) 200-300 MG TABLET    Take 1 tablet by mouth daily.  ? FAMOTIDINE (PEPCID) 20 MG TABLET    Take  1 tablet  2 x /day  to Prevent Heartburn & Indigestion  /  Patient knows to take by mouth  ? FISH OIL-OMEGA-3 FATTY ACIDS 1000 MG CAPSULE    Take 1 g by mouth 2 (two) times daily.  ? GABAPENTIN (NEURONTIN) 100 MG CAPSULE    TAKE ONE CAPSULE BY MOUTH THREE TIMES DAILY AS NEEDED FOR PAIN  ? HYDROCHLOROTHIAZIDE (HYDRODIURIL) 25 MG TABLET    TAKE ONE TABLET BY MOUTH EVERY DAY FOR BLOOD PRESSURE AND FLUID RETENTION AND OR ANKLE SWELLING  ? HYOSCYAMINE (LEVSIN SL) 0.125 MG SL TABLET    Dissolve  1 to 2 tablets under tongue3 to 4 x day if needed for Nausea, Vomiting, Cramping, Bloating  or Diarrhea  ? LISINOPRIL (ZESTRIL) 20 MG TABLET    Take  1 tablet  2 x /day  for BP  ? LORAZEPAM (ATIVAN) 1 MG TABLET    TAKE HALF TO ONE TABLET BY MOUTH 2 TO 3 TIMES A DAY ONLY IF NEEDED FOR ANXIETY ATTACK AND LIMIT TO 5 DAYS/WEEK TO AVOID ADDICTION AND  DEMENTIA - 60 day supply  ? MAGNESIUM 400 MG TABS    Take 400 mg by mouth daily.  ? MELOXICAM (MOBIC) 15 MG TABLET    Take one-half to one tablet by mouth daily with food for pain and inflammation**limit to 5 days/week to avoid kidney damage**  ? METFORMIN (GLUCOPHAGE-XR) 500 MG 24 HR TABLET    TAKE ONE TO TWO TABLETS BY MOUTH TWICE DAILY FOR DIABETES  ? OMEPRAZOLE (PRILOSEC) 20 MG CAPSULE    Take 1 capsule (20 mg total) by mouth 2 (two) times daily.  ? ONDANSETRON (ZOFRAN ODT) 4 MG DISINTEGRATING TABLET    Take 1 tablet (4 mg total) by mouth every 8 (eight) hours as needed for nausea or vomiting.  ? PROPRANOLOL (INDERAL) 80 MG TABLET    take 1 tablet by mouth 3 times a dAY FOR BLOOD PRESSURE  ? ROSUVASTATIN (CRESTOR) 20 MG TABLET    TAKE ONE TABLET BY MOUTH ONE TIME DAILY FOR CHOLESTROL  ? SUCRALFATE (CARAFATE) 1 G TABLET    Take 1 tablet (1 g total) by mouth 4 (four) times daily.  ? VITAMIN D PO    Take 10,000 Units  by mouth daily.  ? ZINC GLUCONATE 50 MG TABLET    Take 50 mg by mouth daily.  ?Modified Medications  ? No medications on file  ?Discontinued Medications  ? No medications on file  ? ? ?Allergies: ?Allergies  ?Allergen Reactions  ? Peanut-Containing Drug Products Anaphylaxis  ? Ceclor [Cefaclor] Hives and Swelling  ? Sulfa Antibiotics Hives  ? ? ?Past Medical History: ?Past Medical History:  ?Diagnosis Date  ? Anxiety   ? Depression   ? Hidradenitis suppurativa   ? Hyperlipidemia   ? Hypertension   ? Prediabetes   ? ? ?Social History: ?Social History  ? ?Socioeconomic History  ? Marital status: Single  ?  Spouse name: Not on file  ? Number of children: 0  ? Years of education: Not on file  ? Highest education level: Not on file  ?Occupational History  ? Occupation: meat cutter  ?  Employer: [employer]  ?Tobacco Use  ? Smoking status: Every Day  ?  Packs/day: 0.60  ?  Years: 10.00  ?  Pack years: 6.00  ?  Types: Cigarettes  ? Smokeless tobacco: Never  ?Vaping Use  ? Vaping Use: Never used  ?Substance  and Sexual Activity  ? Alcohol use: Yes  ?  Comment: occ  ? Drug use: Not Currently  ? Sexual activity: Not on file  ?Other Topics Concern  ? Not on file  ?Social History Narrative  ? Lives alone.    ? ?Social Determinants of Health  ? ?Financial Resource Strain: Not on file  ?Food Insecurity: Not on file  ?Transportation Needs: Not on file  ?Physical Activity: Not on file  ?Stress: Not on file  ?Social Connections: Not on file  ? ? ? ?Assessment: ?Spencer Love comes in today for STI testing after having multiple new partners in the last few weeks. He is experiencing rectal and urethral burning and some mild pain with urination. No itching or discharge. No stomach or back pain, sore throat, headaches, or fevers. No ulcers, chancers, or rashes. He states that he has had these symptoms before when he was diagnosed with gonorrhea.  ? ?Will preemptively treat for gonorrhea today and send in doxycycline for possible chlamydia. Less concerned for syphilis but will check a RPR as well today to rule out. Since he is having painful urination and burning will also get a urine culture and urinalysis. Will follow up on results to see if he needs any further treatment. ? ?Plan: ?- STI screening: RPR, urine/rectal/pharyngeal GC/CT swabs for cytology today ?- Ceftriaxone 500 mg IM x 1 - patient tolerated well ?- Doxycycline 100 mg PO BID x 7 days with food ?- F/u results to see if treatment needed ? ?Lynnie Koehler L. Samar Venneman, PharmD, BCIDP, AAHIVP, CPP ?Clinical Pharmacist Practitioner ?Infectious Diseases Clinical Pharmacist ?[facility] ?03/20/2022, 11:20 AM ? ?

## 2022-03-21 ENCOUNTER — Encounter: Payer: Self-pay | Admitting: Pharmacist

## 2022-03-21 LAB — URINE CYTOLOGY ANCILLARY ONLY
Chlamydia: NEGATIVE
Comment: NEGATIVE
Comment: NORMAL
Neisseria Gonorrhea: NEGATIVE

## 2022-03-21 LAB — URINALYSIS
Bilirubin Urine: NEGATIVE
Glucose, UA: NEGATIVE
Hgb urine dipstick: NEGATIVE
Ketones, ur: NEGATIVE
Leukocytes,Ua: NEGATIVE
Nitrite: NEGATIVE
Protein, ur: NEGATIVE
Specific Gravity, Urine: 1.01 (ref 1.001–1.035)
pH: 6.5 (ref 5.0–8.0)

## 2022-03-21 LAB — URINE CULTURE
MICRO NUMBER:: 13171537
Result:: NO GROWTH
SPECIMEN QUALITY:: ADEQUATE

## 2022-03-21 LAB — CYTOLOGY, (ORAL, ANAL, URETHRAL) ANCILLARY ONLY
Chlamydia: NEGATIVE
Chlamydia: NEGATIVE
Comment: NEGATIVE
Comment: NORMAL
Comment: NEGATIVE
Comment: NORMAL
Neisseria Gonorrhea: NEGATIVE
Neisseria Gonorrhea: NEGATIVE

## 2022-03-21 LAB — RPR: RPR Ser Ql: NONREACTIVE

## 2022-04-24 ENCOUNTER — Other Ambulatory Visit: Payer: Self-pay | Admitting: Nurse Practitioner

## 2022-04-24 ENCOUNTER — Other Ambulatory Visit: Payer: Self-pay | Admitting: Internal Medicine

## 2022-04-24 ENCOUNTER — Other Ambulatory Visit: Payer: Self-pay | Admitting: Adult Health

## 2022-04-24 DIAGNOSIS — E782 Mixed hyperlipidemia: Secondary | ICD-10-CM

## 2022-04-24 DIAGNOSIS — I10 Essential (primary) hypertension: Secondary | ICD-10-CM

## 2022-04-24 DIAGNOSIS — F419 Anxiety disorder, unspecified: Secondary | ICD-10-CM

## 2022-04-30 ENCOUNTER — Ambulatory Visit: Payer: 59 | Admitting: Pharmacist

## 2022-04-30 ENCOUNTER — Other Ambulatory Visit (HOSPITAL_COMMUNITY): Payer: Self-pay

## 2022-05-06 ENCOUNTER — Other Ambulatory Visit: Payer: Self-pay | Admitting: Internal Medicine

## 2022-05-06 DIAGNOSIS — Z79899 Other long term (current) drug therapy: Secondary | ICD-10-CM

## 2022-05-15 ENCOUNTER — Other Ambulatory Visit: Payer: Self-pay | Admitting: Nurse Practitioner

## 2022-05-20 ENCOUNTER — Ambulatory Visit: Payer: 59 | Admitting: Pharmacist

## 2022-05-21 ENCOUNTER — Encounter: Payer: Self-pay | Admitting: Pharmacist

## 2022-05-30 ENCOUNTER — Other Ambulatory Visit: Payer: Self-pay | Admitting: Infectious Diseases

## 2022-05-30 DIAGNOSIS — Z79899 Other long term (current) drug therapy: Secondary | ICD-10-CM

## 2022-06-02 ENCOUNTER — Other Ambulatory Visit: Payer: Self-pay | Admitting: Nurse Practitioner

## 2022-06-02 DIAGNOSIS — I10 Essential (primary) hypertension: Secondary | ICD-10-CM

## 2022-06-09 ENCOUNTER — Encounter: Payer: Self-pay | Admitting: Pharmacist

## 2022-06-10 ENCOUNTER — Other Ambulatory Visit: Payer: Self-pay | Admitting: Pharmacist

## 2022-06-10 DIAGNOSIS — Z113 Encounter for screening for infections with a predominantly sexual mode of transmission: Secondary | ICD-10-CM

## 2022-06-10 DIAGNOSIS — Z79899 Other long term (current) drug therapy: Secondary | ICD-10-CM

## 2022-06-13 ENCOUNTER — Other Ambulatory Visit: Payer: Self-pay

## 2022-06-13 ENCOUNTER — Other Ambulatory Visit: Payer: 59

## 2022-06-13 DIAGNOSIS — Z113 Encounter for screening for infections with a predominantly sexual mode of transmission: Secondary | ICD-10-CM

## 2022-06-13 DIAGNOSIS — Z79899 Other long term (current) drug therapy: Secondary | ICD-10-CM

## 2022-06-16 ENCOUNTER — Other Ambulatory Visit: Payer: Self-pay | Admitting: Infectious Diseases

## 2022-06-16 ENCOUNTER — Other Ambulatory Visit: Payer: Self-pay | Admitting: Nurse Practitioner

## 2022-06-16 DIAGNOSIS — F419 Anxiety disorder, unspecified: Secondary | ICD-10-CM

## 2022-06-16 DIAGNOSIS — Z79899 Other long term (current) drug therapy: Secondary | ICD-10-CM

## 2022-06-16 LAB — RPR: RPR Ser Ql: NONREACTIVE

## 2022-06-16 LAB — HIV ANTIBODY (ROUTINE TESTING W REFLEX): HIV 1&2 Ab, 4th Generation: NONREACTIVE

## 2022-06-17 ENCOUNTER — Encounter: Payer: Self-pay | Admitting: Internal Medicine

## 2022-06-19 ENCOUNTER — Other Ambulatory Visit: Payer: Self-pay | Admitting: Adult Health

## 2022-06-19 ENCOUNTER — Encounter: Payer: Self-pay | Admitting: Pharmacist

## 2022-06-19 ENCOUNTER — Other Ambulatory Visit: Payer: Self-pay | Admitting: Adult Health Nurse Practitioner

## 2022-06-19 DIAGNOSIS — K219 Gastro-esophageal reflux disease without esophagitis: Secondary | ICD-10-CM

## 2022-06-20 ENCOUNTER — Ambulatory Visit: Payer: 59 | Admitting: Internal Medicine

## 2022-06-24 ENCOUNTER — Ambulatory Visit: Payer: 59 | Admitting: Nurse Practitioner

## 2022-06-24 ENCOUNTER — Encounter: Payer: Self-pay | Admitting: Nurse Practitioner

## 2022-06-24 VITALS — BP 100/62 | HR 72 | Temp 97.7°F | Wt 286.2 lb

## 2022-06-24 DIAGNOSIS — J351 Hypertrophy of tonsils: Secondary | ICD-10-CM | POA: Diagnosis not present

## 2022-06-24 DIAGNOSIS — G4733 Obstructive sleep apnea (adult) (pediatric): Secondary | ICD-10-CM

## 2022-06-25 ENCOUNTER — Encounter: Payer: Self-pay | Admitting: Nurse Practitioner

## 2022-07-21 ENCOUNTER — Other Ambulatory Visit: Payer: Self-pay | Admitting: Nurse Practitioner

## 2022-07-21 DIAGNOSIS — F419 Anxiety disorder, unspecified: Secondary | ICD-10-CM

## 2022-07-22 NOTE — Patient Instructions (Signed)
Due to recent changes in healthcare laws, you may see the results of your imaging and laboratory studies on MyChart before your provider has had a chance to review them.  We understand that in some cases there may be results that are confusing or concerning to you. Not all laboratory results come back in the same time frame and the provider may be waiting for multiple results in order to interpret others.  Please give us 48 hours in order for your provider to thoroughly review all the results before contacting the office for clarification of your results.   +++++++++++++++++++++++++++++++++  Vit D  & Vit C 1,000 mg   are recommended to help protect  against the Covid-19 and other Corona viruses.    Also it's recommended  to take  Zinc 50 mg  to help  protect against the Covid-19   and best place to get  is also on [employer]  and don't pay more than 6-8 cents /pill !  ================================= Coronavirus (COVID-19) Are you at risk?  Are you at risk for the Coronavirus (COVID-19)?  To be considered HIGH RISK for Coronavirus (COVID-19), you have to meet the following criteria:  Traveled to [place], [place], [place], [place] or [place]; or in the [place] to [place], [place], [place]  or [place]; and have fever, cough, and shortness of breath within the last 2 weeks of travel OR Been in close contact with a person diagnosed with COVID-19 within the last 2 weeks and have  fever, cough,and shortness of breath  IF YOU DO NOT MEET THESE CRITERIA, YOU ARE CONSIDERED LOW RISK FOR COVID-19.  What to do if you are HIGH RISK for COVID-19?  If you are having a medical emergency, call 911. Seek medical care right away. Before you go to a doctor's office, urgent care or emergency department,  call ahead and tell them about your recent travel, contact with someone diagnosed with COVID-19   and your symptoms.  You should receive instructions from your physician's office  regarding next steps of care.  When you arrive at healthcare provider, tell the healthcare staff immediately you have returned from  visiting [place], [place], [place], [place] or [place]; or traveled in the [place] to [place], [place],  [place] or [place] in the last two weeks or you have been in close contact with a person diagnosed with  COVID-19 in the last 2 weeks.   Tell the health care staff about your symptoms: fever, cough and shortness of breath. After you have been seen by a medical provider, you will be either: Tested for (COVID-19) and discharged home on quarantine except to seek medical care if  symptoms worsen, and asked to  Stay home and avoid contact with others until you get your results (4-5 days)  Avoid travel on public transportation if possible (such as bus, train, or airplane) or Sent to the Emergency Department by EMS for evaluation, COVID-19 testing  and  possible admission depending on your condition and test results.  What to do if you are LOW RISK for COVID-19?  Reduce your risk of any infection by using the same precautions used for avoiding the common cold or flu:  Wash your hands often with soap and warm water for at least 20 seconds.  If soap and water are not readily available,  use an alcohol-based hand sanitizer with at least 60% alcohol.  If coughing or sneezing, cover your mouth and nose by coughing   or sneezing into the elbow areas of your shirt or coat,  into a tissue or into your sleeve (not your hands). Avoid shaking hands with others and consider head nods or verbal greetings only. Avoid touching your eyes, nose, or mouth with unwashed hands.  Avoid close contact with people who are sick. Avoid places or events with large numbers of people in one location, like concerts or sporting events. Carefully consider travel plans you have or are making. If you are planning any travel outside or inside the [place], visit the CDC's Travelers' Health  webpage for the latest health notices. If you have some symptoms but not all symptoms, continue to monitor at home and seek medical attention  if your symptoms worsen. If you are having a medical emergency, call 911. >>>>>>>>>>>>>>>>>>>>>>>> Preventive Care for Adults  A healthy lifestyle and preventive care can promote health and wellness. Preventive health guidelines for men include the following key practices: A routine yearly physical is a good way to check with your health care provider about your health and preventative screening. It is a chance to share any concerns and updates on your health and to receive a thorough exam. Visit your dentist for a routine exam and preventative care every 6 months. Brush your teeth twice a day and floss once a day. Good oral hygiene prevents tooth decay and gum disease. The frequency of eye exams is based on your age, health, family medical history, use of contact lenses, and other factors. Follow your health care provider's recommendations for frequency of eye exams. Eat a healthy diet. Foods such as vegetables, fruits, whole grains, low-fat dairy products, and lean protein foods contain the nutrients you need without too many calories. Decrease your intake of foods high in solid fats, added sugars, and salt. Eat the right amount of calories for you. Get information about a proper diet from your health care provider, if necessary. Regular physical exercise is one of the most important things you can do for your health. Most adults should get at least 150 minutes of moderate-intensity exercise (any activity that increases your heart rate and causes you to sweat) each week. In addition, most adults need muscle-strengthening exercises on 2 or more days a week. Maintain a healthy weight. The body mass index (BMI) is a screening tool to identify possible weight problems. It provides an estimate of body fat based on height and weight. Your health care provider can  find your BMI and can help you achieve or maintain a healthy weight. For adults 20 years and older: A BMI below 18.5 is considered underweight. A BMI of 18.5 to 24.9 is normal. A BMI of 25 to 29.9 is considered overweight. A BMI of 30 and above is considered obese. Maintain normal blood lipids and cholesterol levels by exercising and minimizing your intake of saturated fat. Eat a balanced diet with plenty of fruit and vegetables. Blood tests for lipids and cholesterol should begin at age 20 and be repeated every 5 years. If your lipid or cholesterol levels are high, you are over 50, or you are at high risk for heart disease, you may need your cholesterol levels checked more frequently. Ongoing high lipid and cholesterol levels should be treated with medicines if diet and exercise are not working. If you smoke, find out from your health care provider how to quit. If you do not use tobacco, do not start. Lung cancer screening is recommended for adults aged 55-80 years who are at high risk for   developing lung cancer because of a history of smoking. A yearly low-dose CT scan of the lungs is recommended for people who have at least a 30-pack-year history of smoking and are a current smoker or have quit within the past 15 years. A pack year of smoking is smoking an average of 1 pack of cigarettes a day for 1 year (for example: 1 pack a day for 30 years or 2 packs a day for 15 years). Yearly screening should continue until the smoker has stopped smoking for at least 15 years. Yearly screening should be stopped for people who develop a health problem that would prevent them from having lung cancer treatment. If you choose to drink alcohol, do not have more than 2 drinks per day. One drink is considered to be 12 ounces (355 mL) of beer, 5 ounces (148 mL) of wine, or 1.5 ounces (44 mL) of liquor. High blood pressure causes heart disease and increases the risk of stroke. Your blood pressure should be checked. Ongoing  high blood pressure should be treated with medicines, if weight loss and exercise are not effective. If you are 50-61 years old, ask your health care provider if you should take aspirin to prevent heart disease. Diabetes screening involves taking a blood sample to check your fasting blood sugar level. Testing should be considered at a younger age or be carried out more frequently if you are overweight and have at least 1 risk factor for diabetes. Colorectal cancer can be detected and often prevented. Most routine colorectal cancer screening begins at the age of 32 and continues through age 64. However, your health care provider may recommend screening at an earlier age if you have risk factors for colon cancer. On a yearly basis, your health care provider may provide home test kits to check for hidden blood in the stool. Use of a small camera at the end of a tube to directly examine the colon (sigmoidoscopy or colonoscopy) can detect the earliest forms of colorectal cancer. Talk to your health care provider about this at age 48, when routine screening begins. Direct exam of the colon should be repeated every 5-10 years through age 56, unless early forms of precancerous polyps or small growths are found. Screening for abdominal aortic aneurysm (AAA)  are recommended for persons over age 52 who have history of hypertensionor who are current or former smokers. Talk with your health care provider about prostate cancer screening. Testicular cancer screening is recommended for adult males. Screening includes self-exam, a health care provider exam, and other screening tests. Consult with your health care provider about any symptoms you have or any concerns you have about testicular cancer. Use sunscreen. Apply sunscreen liberally and repeatedly throughout the day. You should seek shade when your shadow is shorter than you. Protect yourself by wearing long sleeves, pants, a wide-brimmed hat, and sunglasses year  round, whenever you are outdoors. Once a month, do a whole-body skin exam, using a mirror to look at the skin on your back. Tell your health care provider about new moles, moles that have irregular borders, moles that are larger than a pencil eraser, or moles that have changed in shape or color. Stay current with required vaccines (immunizations). Influenza vaccine. All adults should be immunized every year. Tetanus, diphtheria, and acellular pertussis (Td, Tdap) vaccine. An adult who has not previously received Tdap or who does not know his vaccine status should receive 1 dose of Tdap. This initial dose should be followed by tetanus  and diphtheria toxoids (Td) booster doses every 10 years. Adults with an unknown or incomplete history of completing a 3-dose immunization series with Td-containing vaccines should begin or complete a primary immunization series including a Tdap dose. Adults should receive a Td booster every 10 years. Zoster vaccine. One dose is recommended for adults aged 84 years or older unless certain conditions are present.  Pneumococcal 13-valent conjugate (PCV13) vaccine. When indicated, a person who is uncertain of his immunization history and has no record of immunization should receive the PCV13 vaccine. An adult aged 18 years or older who has certain medical conditions and has not been previously immunized should receive 1 dose of PCV13 vaccine. This PCV13 should be followed with a dose of pneumococcal polysaccharide (PPSV23) vaccine. The PPSV23 vaccine dose should be obtained at least 8 weeks after the dose of PCV13 vaccine. An adult aged 36 years or older who has certain medical conditions and previously received 1 or more doses of PPSV23 vaccine should receive 1 dose of PCV13. The PCV13 vaccine dose should be obtained 1 or more years after the last PPSV23 vaccine dose.  Pneumococcal polysaccharide (PPSV23) vaccine. When PCV13 is also indicated, PCV13 should be obtained first. All  adults aged 40 years and older should be immunized. An adult younger than age 15 years who has certain medical conditions should be immunized. Any person who resides in a nursing home or long-term care facility should be immunized. An adult smoker should be immunized. People with an immunocompromised condition and certain other conditions should receive both PCV13 and PPSV23 vaccines. People with human immunodeficiency virus (HIV) infection should be immunized as soon as possible after diagnosis. Immunization during chemotherapy or radiation therapy should be avoided. Routine use of PPSV23 vaccine is not recommended for American Indians, [place], or people younger than 65 years unless there are medical conditions that require PPSV23 vaccine. When indicated, people who have unknown immunization and have no record of immunization should receive PPSV23 vaccine. One-time revaccination 5 years after the first dose of PPSV23 is recommended for people aged 19-64 years who have chronic kidney failure, nephrotic syndrome, asplenia, or immunocompromised conditions. People who received 1-2 doses of PPSV23 before age 30 years should receive another dose of PPSV23 vaccine at age 60 years or later if at least 5 years have passed since the previous dose. Doses of PPSV23 are not needed for people immunized with PPSV23 at or after age 48 years. Hepatitis A vaccine. Adults who wish to be protected from this disease, have certain high-risk conditions, work with hepatitis A-infected animals, work in hepatitis A research labs, or travel to or work in countries with a high rate of hepatitis A should be immunized. Adults who were previously unvaccinated and who anticipate close contact with an international adoptee during the first 60 days after arrival in the [place] from a country with a high rate of hepatitis A should be immunized. Hepatitis B vaccine. Adults should be immunized if they wish to be protected from this  disease, have certain high-risk conditions, may be exposed to blood or other infectious body fluids, are household contacts or sex partners of hepatitis B positive people, are clients or workers in certain care facilities, or travel to or work in countries with a high rate of hepatitis B.  Preventive Service / Frequency  Ages 6 to 1 Blood pressure check. Lipid and cholesterol check. Hepatitis C blood test.** / For any individual with known risks for hepatitis C. Skin self-exam. /  Monthly. Influenza vaccine. / Every year. Tetanus, diphtheria, and acellular pertussis (Tdap, Td) vaccine.** / Consult your health care provider. 1 dose of Td every 10 years. HPV vaccine. / 3 doses over 6 months, if 26 or younger. Measles, mumps, rubella (MMR) vaccine.** / You need at least 1 dose of MMR if you were born in 1957 or later. You may also need a second dose. Pneumococcal 13-valent conjugate (PCV13) vaccine.** / Consult your health care provider. Pneumococcal polysaccharide (PPSV23) vaccine.** / 1 to 2 doses if you smoke cigarettes or if you have certain conditions. Meningococcal vaccine.** / 1 dose if you are age 19 to 21 years and a first-year college student living in a residence hall, or have one of several medical conditions. You may also need additional booster doses. Hepatitis A vaccine.** / Consult your health care provider. Hepatitis B vaccine.** / Consult your health care provider. +++++++++ Recommend Adult Low Dose Aspirin or  coated  Aspirin 81 mg daily  To reduce risk of Colon Cancer 40 %,  Skin Cancer 26 % ,  Melanoma 46%  and  Pancreatic cancer 60% ++++++++++++++++++ Vitamin D goal  is between 70-100.  Please make sure that you are taking your Vitamin D as directed.  It is very important as a natural anti-inflammatory  helping hair, skin, and nails, as well as reducing stroke and heart attack risk.  It helps your bones and helps with mood. It also decreases numerous cancer risks  so please take it as directed.  Low Vit D is associated with a 200-300% higher risk for CANCER  and 200-300% higher risk for HEART   ATTACK  &  STROKE.   ...................................... It is also associated with higher death rate at younger ages,  autoimmune diseases like Rheumatoid arthritis, Lupus, Multiple Sclerosis.    Also many other serious conditions, like depression, Alzheimer's Dementia, infertility, muscle aches, fatigue, fibromyalgia - just to name a few. +++++++++++++++++++++ Recommend the book "The END of DIETING" by Dr Joel Fuhrman  & the book "The END of DIABETES " by Dr Joel Fuhrman At [employer] - get book & Audio CD's    Being diabetic has a  300% increased risk for heart attack, stroke, cancer, and alzheimer- type vascular dementia. It is very important that you work harder with diet by avoiding all foods that are white. Avoid white rice (brown & wild rice is OK), white potatoes (sweetpotatoes in moderation is OK), White bread or wheat bread or anything made out of white flour like bagels, donuts, rolls, buns, biscuits, cakes, pastries, cookies, pizza crust, and pasta (made from white flour & egg whites) - vegetarian pasta or spinach or wheat pasta is OK. Multigrain breads like Arnold's or [employer], or multigrain sandwich thins or flatbreads.  Diet, exercise and weight loss can reverse and cure diabetes in the early stages.  Diet, exercise and weight loss is very important in the control and prevention of complications of diabetes which affects every system in your body, ie. Brain - dementia/stroke, eyes - glaucoma/blindness, heart - heart attack/heart failure, kidneys - dialysis, stomach - gastric paralysis, intestines - malabsorption, nerves - severe painful neuritis, circulation - gangrene & loss of a leg(s), and finally cancer and Alzheimers.    I recommend avoid fried & greasy foods,  sweets/candy, white rice (brown or wild rice or Quinoa is OK), white potatoes  (sweet potatoes are OK) - anything made from white flour - bagels, doughnuts, rolls, buns, biscuits,white and wheat breads, pizza crust and   traditional pasta made of white flour & egg white(vegetarian pasta or spinach or wheat pasta is OK).  Multi-grain bread is OK - like multi-grain flat bread or sandwich thins. Avoid alcohol in excess. Exercise is also important.    Eat all the vegetables you want - avoid meat, especially red meat and dairy - especially cheese.  Cheese is the most concentrated form of trans-fats which is the worst thing to clog up our arteries. Veggie cheese is OK which can be found in the fresh produce section at [employer] or [employer] or [employer]  +++++++++++++++++++ DASH Eating Plan  DASH stands for "Dietary Approaches to Stop Hypertension."   The DASH eating plan is a healthy eating plan that has been shown to reduce high blood pressure (hypertension). Additional health benefits may include reducing the risk of type 2 diabetes mellitus, heart disease, and stroke. The DASH eating plan may also help with weight loss. WHAT DO I NEED TO KNOW ABOUT THE DASH EATING PLAN? For the DASH eating plan, you will follow these general guidelines: Choose foods with a percent daily value for sodium of less than 5% (as listed on the food label). Use salt-free seasonings or herbs instead of table salt or sea salt. Check with your health care provider or pharmacist before using salt substitutes. Eat lower-sodium products, often labeled as "lower sodium" or "no salt added." Eat fresh foods. Eat more vegetables, fruits, and low-fat dairy products. Choose whole grains. Look for the word "whole" as the first word in the ingredient list. Choose fish  Limit sweets, desserts, sugars, and sugary drinks. Choose heart-healthy fats. Eat veggie cheese  Eat more home-cooked food and less restaurant, buffet, and fast food. Limit fried foods. Cook foods using methods other than frying. Limit  canned vegetables. If you do use them, rinse them well to decrease the sodium. When eating at a restaurant, ask that your food be prepared with less salt, or no salt if possible.                      WHAT FOODS CAN I EAT? Read Dr Fara Olden Fuhrman's books on The End of Dieting & The End of Diabetes  Grains Whole grain or whole wheat bread. Brown rice. Whole grain or whole wheat pasta. Quinoa, bulgur, and whole grain cereals. Low-sodium cereals. Corn or whole wheat flour tortillas. Whole grain cornbread. Whole grain crackers. Low-sodium crackers.  Vegetables Fresh or frozen vegetables (raw, steamed, roasted, or grilled). Low-sodium or reduced-sodium tomato and vegetable juices. Low-sodium or reduced-sodium tomato sauce and paste. Low-sodium or reduced-sodium canned vegetables.   Fruits All fresh, canned (in natural juice), or frozen fruits.  Protein Products  All fish and seafood.  Dried beans, peas, or lentils. Unsalted nuts and seeds. Unsalted canned beans.  Dairy Low-fat dairy products, such as skim or 1% milk, 2% or reduced-fat cheeses, low-fat ricotta or cottage cheese, or plain low-fat yogurt. Low-sodium or reduced-sodium cheeses.  Fats and Oils Tub margarines without trans fats. Light or reduced-fat mayonnaise and salad dressings (reduced sodium). Avocado. Safflower, olive, or canola oils. Natural peanut or almond butter.  Other Unsalted popcorn and pretzels. The items listed above may not be a complete list of recommended foods or beverages. Contact your dietitian for more options.  +++++++++++++++++++  WHAT FOODS ARE NOT RECOMMENDED? Grains/ White flour or wheat flour White bread. White pasta. White rice. Refined cornbread. Bagels and croissants. Crackers that contain trans fat.  Vegetables  Creamed or fried vegetables. Vegetables  in a . Regular canned vegetables. Regular canned tomato sauce and paste. Regular tomato and vegetable juices.  Fruits Dried fruits. Canned fruit  in light or heavy syrup. Fruit juice.  Meat and Other Protein Products Meat in general - RED meat & White meat.  Fatty cuts of meat. Ribs, chicken wings, all processed meats as bacon, sausage, bologna, salami, fatback, hot dogs, bratwurst and packaged luncheon meats.  Dairy Whole or 2% milk, cream, half-and-half, and cream cheese. Whole-fat or sweetened yogurt. Full-fat cheeses or blue cheese. Non-dairy creamers and whipped toppings. Processed cheese, cheese spreads, or cheese curds.  Condiments Onion and garlic salt, seasoned salt, table salt, and sea salt. Canned and packaged gravies. Worcestershire sauce. Tartar sauce. Barbecue sauce. Teriyaki sauce. Soy sauce, including reduced sodium. Steak sauce. Fish sauce. Oyster sauce. Cocktail sauce. Horseradish. Ketchup and mustard. Meat flavorings and tenderizers. Bouillon cubes. Hot sauce. Tabasco sauce. Marinades. Taco seasonings. Relishes.  Fats and Oils Butter, stick margarine, lard, shortening and bacon fat. Coconut, palm kernel, or palm oils. Regular salad dressings.  Pickles and olives. Salted popcorn and pretzels.  The items listed above may not be a complete list of foods and beverages to avoid.   

## 2022-07-22 NOTE — Progress Notes (Signed)
Annual  Screening/Preventative Visit  & Comprehensive Evaluation & Examination  Future Appointments  Date Time Provider Department  07/23/2022  3:00 PM Lucky Cowboy, MD [facility]  08/13/2022 10:00 AM Dohmeier, Porfirio Mylar, MD [facility]  07/28/2023  3:00 PM Lucky Cowboy, MD [facility]              This very nice 36 y.o. single WM presents for a Screening /Preventative Visit & comprehensive evaluation and management of multiple medical co-morbidities.  Patient has been followed for HTN, HLD, Moderate Obesity, Insulin Resistance /PreDiabetes  and Vitamin D Deficiency.  Patient has OSA, but uses CPAP very infrequently due to mask intolerance. morbidities. Patient has GERD controlled on his meds.       HTN predates circa 2014. Patient's BP has been controlled and  today's BP is                            . Patient denies any cardiac symptoms as chest pain, palpitations, shortness of breath, dizziness or ankle swelling.       Patient's hyperlipidemia is controlled with diet and Rosuvastatin. Patient denies myalgias or other medication SE's. Last lipids were at goal except sl elevated Trig's:  Lab Results  Component Value Date   CHOL 94 01/28/2022   HDL 32 (L) 01/28/2022   LDLCALC 42 01/28/2022   TRIG 110 01/28/2022   CHOLHDL 2.9 01/28/2022             Patient has Morbid Obesity (BMI 35+/- ) and hx/o Pre-Diabetes/Insulin Resistance (A1c 5.3% / elevated Insulin 152 / 2015)  and  he was initiated on Metformin for Insulin resistance and patient denies reactive hypoglycemic symptoms, visual blurring, diabetic polys or paresthesias. Last A1c was normal & at goal:   Lab Results  Component Value Date   HGBA1C 4.6 07/23/2021        Patient has hx/o Low Testosterone "212" in Nov 2016 and "207" in Jan 2017  and has self d/c'd the Testosterone replacement .       Finally, patient has history of Vitamin D Deficiency ("23" /2015 and "24" /2016) & last vitamin D was at goal:   Lab Results   Component Value Date   VD25OH 74 07/23/2021        Current Outpatient Medications:    amLODipine (NORVASC) 10 MG tablet, Take one-half to one tablet by mouth daily for blood pressure, Disp: 90 tablet, Rfl: 0   Ascorbic Acid (VITAMIN C) 1000 MG tablet, Take 1,000 mg by mouth daily., Disp: , Rfl:    aspirin EC 81 MG tablet, Take 81 mg by mouth 2 (two) times daily., Disp: , Rfl:    cloNIDine (CATAPRES) 0.2 MG tablet, TAKE ONE TABLET BY MOUTH THREE TIMES A DAY FOR BLOOD PRESSURE, Disp: 270 tablet, Rfl: 0   emtricitabine-tenofovir (TRUVADA) 200-300 MG tablet, TAKE ONE TABLET BY MOUTH ONE TIME DAILY, Disp: 30 tablet, Rfl: 2   famotidine (PEPCID) 20 MG tablet, Take  1 tablet  2 x /day  to Prevent Heartburn & Indigestion  /  Patient knows to take by mouth, Disp: 180 tablet, Rfl: 3   fish oil-omega-3 fatty acids 1000 MG capsule, Take 1 g by mouth 2 (two) times daily., Disp: , Rfl:    gabapentin (NEURONTIN) 100 MG capsule, TAKE ONE CAPSULE BY MOUTH THREE TIMES DAILY AS NEEDED FOR PAIN, Disp: 270 capsule, Rfl: 0   hydrochlorothiazide (HYDRODIURIL) 25 MG tablet, TAKE ONE TABLET BY  MOUTH ONE TIME DAILY FOR BLOOD PRESSURE, AND/OR ANKLE SWELLING, Disp: 90 tablet, Rfl: 0   hyoscyamine (LEVSIN SL) 0.125 MG SL tablet, Dissolve  1 to 2 tablets under tongue3 to 4 x day if needed for Nausea, Vomiting, Cramping, Bloating  or Diarrhea, Disp: 90 tablet, Rfl: 0   KLS OMPERAZOLE 20 MG TBEC, take 1 tablet by mouth twice a day, Disp: 42 tablet, Rfl: 0   lisinopril (ZESTRIL) 20 MG tablet, TAKE ONE TABLET BY MOUTH TWICE DAILY FOR BLOOD PRESSURE, Disp: 180 tablet, Rfl: 0   LORazepam (ATIVAN) 1 MG tablet, take half to one tablet by mouth 2 to 3 times a day only if needed for anxiety attack & limit to 5days/wk to avoid addiction/demnentia, Disp: 40 tablet, Rfl: 0   Magnesium 400 MG TABS, Take 400 mg by mouth daily., Disp: , Rfl:    metFORMIN (GLUCOPHAGE-XR) 500 MG 24 hr tablet, TAKE ONE OR TWO TABLETS BY MOUTH TWICE DAILY FOR  DIABETES, Disp: 360 tablet, Rfl: 0   ondansetron (ZOFRAN ODT) 4 MG disintegrating tablet, Take 1 tablet (4 mg total) by mouth every 8 (eight) hours as needed for nausea or vomiting., Disp: 10 tablet, Rfl: 0   propranolol (INDERAL) 80 MG tablet, TAKE ONE TABLET BY MOUTH THREE TIMES DAILY FOR BLOOD PRESSURE, Disp: 270 tablet, Rfl: 0   rosuvastatin (CRESTOR) 20 MG tablet, TAKE ONE TABLET BY MOUTH EVERY DAY FOR CHOLESTEROL, Disp: 90 tablet, Rfl: 0   sucralfate (CARAFATE) 1 g tablet, Take 1 tablet (1 g total) by mouth 4 (four) times daily., Disp: 120 tablet, Rfl: 1   VITAMIN D PO, Take 10,000 Units by mouth daily., Disp: , Rfl:    zinc gluconate 50 MG tablet, Take 50 mg by mouth daily., Disp: , Rfl:      Allergies  Allergen Reactions   Peanut-Containing Drug Products Anaphylaxis   Ceclor [Cefaclor] Hives and Swelling   Sulfa Antibiotics Hives    Past Medical History:  Diagnosis Date   Anxiety    Depression    Hidradenitis suppurativa    Hyperlipidemia    Hypertension    Prediabetes     Health Maintenance  Topic Date Due   PNEUMOCOCCAL POLYSACCHARIDE VACCINE AGE 41-64 HIGH RISK  Never done   COVID-19 Vaccine (1) Never done   Pneumococcal Vaccine 12-20 Years old (1 - PCV) Never done   FOOT EXAM  Never done   OPHTHALMOLOGY EXAM  12/10/2017   INFLUENZA VACCINE  07/29/2021   HEMOGLOBIN A1C  11/09/2021   TETANUS/TDAP  10/10/2025   Hepatitis C Screening  Completed   HIV Screening  Completed   HPV VACCINES  Aged Out    Immunization History  Administered Date(s) Administered   Hepatitis A, Adult 02/16/2019, 01/24/2020   Hepatitis B, adult 02/16/2019   Hepb-cpg 01/24/2020   Influenza Inj Mdck Quad With Preservative 11/01/2019, 12/11/2020   Influenza Split 10/11/2015   Influenza,inj,Quad PF,6+ Mos 10/13/2014   PPD Test 10/11/2015, 04/19/2019, 05/16/2020   Tdap 10/11/2015     Past Surgical History:  Procedure Laterality Date   None       Family History  Problem Relation Age  of Onset   Heart disease Maternal Grandmother        Died from bypass surgery   Heart disease Mother        Bicuspid aortic valve   Heart disease Sister        Bicuspid aortic valve   Colitis Sister    Diabetes Sister  Heart disease Father        Bicuspid aortic valve   Breast cancer Paternal Grandmother    Diabetes Paternal Grandmother    Esophageal cancer Paternal Grandfather    Colon cancer Paternal Aunt     Social History   Socioeconomic History   Marital status: Single  Occupational History   Occupation: Research scientist (life sciences): [employer]  Tobacco Use   Smoking status: Every Day    Packs/day: 0.60    Years: 10.00    Pack years: 6.00    Types: Cigarettes   Smokeless tobacco: Never  Vaping Use   Vaping Use: Never used  Substance and Sexual Activity   Alcohol use: Yes    Comment: social   Drug use: Yes    Types: Marijuana   Sexual activity: Not on file  Social History Narrative   Lives alone.      ROS Constitutional: Denies fever, chills, weight loss/gain, headaches, insomnia,  night sweats or change in appetite. Does c/o fatigue. Eyes: Denies redness, blurred vision, diplopia, discharge, itchy or watery eyes.  ENT: Denies discharge, congestion, post nasal drip, epistaxis, sore throat, earache, hearing loss, dental pain, Tinnitus, Vertigo, Sinus pain or snoring.  Cardio: Denies chest pain, palpitations, irregular heartbeat, syncope, dyspnea, diaphoresis, orthopnea, PND, claudication or edema Respiratory: denies cough, dyspnea, DOE, pleurisy, hoarseness, laryngitis or wheezing.  Gastrointestinal: Denies dysphagia, heartburn, reflux, water brash, pain, cramps, nausea, vomiting, bloating, diarrhea, constipation, hematemesis, melena, hematochezia, jaundice or hemorrhoids Genitourinary: Denies dysuria, frequency, urgency, nocturia, hesitancy, discharge, hematuria or flank pain Musculoskeletal: Denies arthralgia, myalgia, stiffness, Jt. Swelling, pain, limp or  strain/sprain. Denies Falls. Skin: Denies puritis, rash, hives, warts, acne, eczema or change in skin lesion Neuro: No weakness, tremor, incoordination, spasms, paresthesia or pain Psychiatric: Denies confusion, memory loss or sensory loss. Denies Depression. Endocrine: Denies change in weight, skin, hair change, nocturia, and paresthesia, diabetic polys, visual blurring or hyper / hypo glycemic episodes.  Heme/Lymph: No excessive bleeding, bruising or enlarged lymph nodes.   Physical Exam  There were no vitals taken for this visit.  General Appearance: Well nourished and well groomed and in no apparent distress.  Eyes: PERRLA, EOMs, conjunctiva no swelling or erythema, normal fundi and vessels. Sinuses: No frontal/maxillary tenderness ENT/Mouth: EACs patent / TMs  nl. Nares clear without erythema, swelling, mucoid exudates. Oral hygiene is good. No erythema, swelling, or exudate. Tongue normal, non-obstructing. Tonsils not swollen or erythematous. Hearing normal.  Neck: Supple, thyroid not palpable. No bruits, nodes or JVD. Respiratory: Respiratory effort normal.  BS equal and clear bilateral without rales, rhonci, wheezing or stridor. Cardio: Heart sounds are normal with regular rate and rhythm and no murmurs, rubs or gallops. Peripheral pulses are normal and equal bilaterally without edema. No aortic or femoral bruits. Chest: symmetric with normal excursions and percussion.  Abdomen: Soft, with Nl bowel sounds. Nontender, no guarding, rebound, hernias, masses, or organomegaly.  Lymphatics: Non tender without lymphadenopathy.  Musculoskeletal: Full ROM all peripheral extremities, joint stability, 5/5 strength, and normal gait. Skin: Warm and dry without rashes, lesions, cyanosis, clubbing or  ecchymosis.  Neuro: Cranial nerves intact, reflexes equal bilaterally. Normal muscle tone, no cerebellar symptoms. Sensation intact.  Pysch: Alert and oriented X 3 with normal affect, insight and  judgment appropriate.   Assessment and Plan  1. Annual Preventative/Screening Exam    2. Essential hypertension  - EKG 12-Lead - Urinalysis, Routine w reflex microscopic - Microalbumin / creatinine urine ratio - CBC with Differential/Platelet -  COMPLETE METABOLIC PANEL WITH GFR - Magnesium - TSH  3. Hyperlipidemia, mixed  - EKG 12-Lead - Lipid panel - TSH  4. Insulin resistance  - EKG 12-Lead - Hemoglobin A1c - Insulin, random  5. Vitamin D deficiency  - VITAMIN D 25 Hydroxy   6. Prediabetes  - EKG 12-Lead - Hemoglobin A1c - Insulin, random  7. Testosterone deficiency  - Testosterone  8. OSA (obstructive sleep apnea)   9. Screening-pulmonary TB  - TB Skin Test  10. Screening for colorectal cancer  - POC Hemoccult Bld/Stl   11. Screening for ischemic heart disease  - EKG 12-Lead  12. Family history of ischemic heart disease  - EKG 12-Lead  13. Fatigue  - Iron, Total/Total Iron Binding Cap - Vitamin B12 - Testosterone - CBC with Differential/Platelet - TSH  14. Medication management  - CBC with Differential/Platelet - COMPLETE METABOLIC PANEL WITH GFR - Magnesium - Lipid panel - TSH - Hemoglobin A1c - VITAMIN D 25 Hydroxy         Patient was counseled in prudent diet, weight control to achieve/maintain BMI less than 25, BP monitoring, regular exercise and medications as discussed.  Discussed med effects and SE's. Routine screening labs and tests as requested with regular follow-up as recommended. Over 40 minutes of exam, counseling, chart review and high complex critical decision making was performed   Marinus Maw, MD

## 2022-07-23 ENCOUNTER — Ambulatory Visit (INDEPENDENT_AMBULATORY_CARE_PROVIDER_SITE_OTHER): Payer: 59 | Admitting: Internal Medicine

## 2022-07-23 ENCOUNTER — Encounter: Payer: Self-pay | Admitting: Internal Medicine

## 2022-07-23 ENCOUNTER — Other Ambulatory Visit: Payer: Self-pay | Admitting: Nurse Practitioner

## 2022-07-23 VITALS — BP 120/68 | HR 84 | Temp 96.9°F | Ht 77.0 in | Wt 285.2 lb

## 2022-07-23 DIAGNOSIS — Z0000 Encounter for general adult medical examination without abnormal findings: Secondary | ICD-10-CM

## 2022-07-23 DIAGNOSIS — R7303 Prediabetes: Secondary | ICD-10-CM

## 2022-07-23 DIAGNOSIS — Z0001 Encounter for general adult medical examination with abnormal findings: Secondary | ICD-10-CM

## 2022-07-23 DIAGNOSIS — R7309 Other abnormal glucose: Secondary | ICD-10-CM

## 2022-07-23 DIAGNOSIS — Z8249 Family history of ischemic heart disease and other diseases of the circulatory system: Secondary | ICD-10-CM

## 2022-07-23 DIAGNOSIS — I10 Essential (primary) hypertension: Secondary | ICD-10-CM | POA: Diagnosis not present

## 2022-07-23 DIAGNOSIS — G4733 Obstructive sleep apnea (adult) (pediatric): Secondary | ICD-10-CM

## 2022-07-23 DIAGNOSIS — Z136 Encounter for screening for cardiovascular disorders: Secondary | ICD-10-CM

## 2022-07-23 DIAGNOSIS — E349 Endocrine disorder, unspecified: Secondary | ICD-10-CM

## 2022-07-23 DIAGNOSIS — E8881 Metabolic syndrome: Secondary | ICD-10-CM

## 2022-07-23 DIAGNOSIS — R5383 Other fatigue: Secondary | ICD-10-CM

## 2022-07-23 DIAGNOSIS — Z111 Encounter for screening for respiratory tuberculosis: Secondary | ICD-10-CM

## 2022-07-23 DIAGNOSIS — E782 Mixed hyperlipidemia: Secondary | ICD-10-CM

## 2022-07-23 DIAGNOSIS — Z79899 Other long term (current) drug therapy: Secondary | ICD-10-CM

## 2022-07-23 DIAGNOSIS — F17200 Nicotine dependence, unspecified, uncomplicated: Secondary | ICD-10-CM

## 2022-07-23 DIAGNOSIS — Z1211 Encounter for screening for malignant neoplasm of colon: Secondary | ICD-10-CM

## 2022-07-23 DIAGNOSIS — E559 Vitamin D deficiency, unspecified: Secondary | ICD-10-CM

## 2022-07-23 MED ORDER — NICOTINE 7 MG/24HR TD PT24: 7.0000 mg | MEDICATED_PATCH | TRANSDERMAL | 2 refills | Status: DC

## 2022-07-24 LAB — HEMOGLOBIN A1C
Hgb A1c MFr Bld: 4.8 %{Hb}
Mean Plasma Glucose: 91 mg/dL
eAG (mmol/L): 5 mmol/L

## 2022-07-24 LAB — CBC WITH DIFFERENTIAL/PLATELET
Absolute Monocytes: 540 cells/uL (ref 200–950)
Basophils Absolute: 43 cells/uL (ref 0–200)
Basophils Relative: 0.6 %
Eosinophils Absolute: 149 cells/uL (ref 15–500)
Eosinophils Relative: 2.1 %
HCT: 43.7 % (ref 38.5–50.0)
Hemoglobin: 15.3 g/dL (ref 13.2–17.1)
Lymphs Abs: 2265 cells/uL (ref 850–3900)
MCH: 31 pg (ref 27.0–33.0)
MCHC: 35 g/dL (ref 32.0–36.0)
MCV: 88.5 fL (ref 80.0–100.0)
MPV: 10.9 fL (ref 7.5–12.5)
Monocytes Relative: 7.6 %
Neutro Abs: 4104 cells/uL (ref 1500–7800)
Neutrophils Relative %: 57.8 %
Platelets: 202 10*3/uL (ref 140–400)
RBC: 4.94 10*6/uL (ref 4.20–5.80)
RDW: 13.1 % (ref 11.0–15.0)
Total Lymphocyte: 31.9 %
WBC: 7.1 10*3/uL (ref 3.8–10.8)

## 2022-07-24 LAB — URINALYSIS, ROUTINE W REFLEX MICROSCOPIC
Bilirubin Urine: NEGATIVE
Glucose, UA: NEGATIVE
Hgb urine dipstick: NEGATIVE
Ketones, ur: NEGATIVE
Leukocytes,Ua: NEGATIVE
Nitrite: NEGATIVE
Protein, ur: NEGATIVE
Specific Gravity, Urine: 1.009 (ref 1.001–1.035)
pH: 6 (ref 5.0–8.0)

## 2022-07-24 LAB — COMPLETE METABOLIC PANEL WITHOUT GFR
AG Ratio: 2.7 (calc) — ABNORMAL HIGH (ref 1.0–2.5)
ALT: 31 U/L (ref 9–46)
AST: 23 U/L (ref 10–40)
Albumin: 5.2 g/dL — ABNORMAL HIGH (ref 3.6–5.1)
Alkaline phosphatase (APISO): 48 U/L (ref 36–130)
BUN: 12 mg/dL (ref 7–25)
CO2: 29 mmol/L (ref 20–32)
Calcium: 9.6 mg/dL (ref 8.6–10.3)
Chloride: 100 mmol/L (ref 98–110)
Creat: 0.86 mg/dL (ref 0.60–1.26)
Globulin: 1.9 g/dL (ref 1.9–3.7)
Glucose, Bld: 85 mg/dL (ref 65–99)
Potassium: 4.1 mmol/L (ref 3.5–5.3)
Sodium: 138 mmol/L (ref 135–146)
Total Bilirubin: 0.6 mg/dL (ref 0.2–1.2)
Total Protein: 7.1 g/dL (ref 6.1–8.1)
eGFR: 117 mL/min/1.73m2

## 2022-07-24 LAB — LIPID PANEL
Cholesterol: 121 mg/dL (ref <200)
HDL: 29 mg/dL — ABNORMAL LOW (ref >=40)
LDL Cholesterol (Calc): 66 mg/dL (calc)
Non-HDL Cholesterol (Calc): 92 mg/dL (calc) (ref <130)
Total CHOL/HDL Ratio: 4.2 (calc) (ref <5.0)
Triglycerides: 184 mg/dL — ABNORMAL HIGH (ref <150)

## 2022-07-24 LAB — TSH: TSH: 0.74 mIU/L (ref 0.40–4.50)

## 2022-07-24 LAB — IRON, TOTAL/TOTAL IRON BINDING CAP
%SAT: 26 % (calc) (ref 20–48)
Iron: 82 ug/dL (ref 50–180)
TIBC: 321 mcg/dL (calc) (ref 250–425)

## 2022-07-24 LAB — VITAMIN D 25 HYDROXY (VIT D DEFICIENCY, FRACTURES): Vit D, 25-Hydroxy: 61 ng/mL (ref 30–100)

## 2022-07-24 LAB — MICROALBUMIN / CREATININE URINE RATIO
Creatinine, Urine: 63 mg/dL (ref 20–320)
Microalb Creat Ratio: 5 mcg/mg creat (ref <30)
Microalb, Ur: 0.3 mg/dL

## 2022-07-24 LAB — VITAMIN B12: Vitamin B-12: 389 pg/mL (ref 200–1100)

## 2022-07-24 LAB — TESTOSTERONE: Testosterone: 339 ng/dL (ref 250–827)

## 2022-07-24 LAB — INSULIN, RANDOM: Insulin: 27.8 u[IU]/mL — ABNORMAL HIGH

## 2022-07-24 LAB — MAGNESIUM: Magnesium: 2.1 mg/dL (ref 1.5–2.5)

## 2022-07-24 NOTE — Progress Notes (Signed)
<><><><><><><><><><><><><><><><><><><><><><><><><><><><><><><><><> <><><><><><><><><><><><><><><><><><><><><><><><><><><><><><><><><> -   Test results slightly outside the reference range are not unusual. If there is anything important, I will review this with you,  otherwise it is considered normal test values.  If you have further questions,  please do not hesitate to contact me at the office or via My Chart.  <><><><><><><><><><><><><><><><><><><><><><><><><><><><><><><><><> <><><><><><><><><><><><><><><><><><><><><><><><><><><><><><><><><>  -  Total Chol = 121 -  Excellent   - Very low risk for Heart Attack  / Stroke <><><><><><><><><><><><><><><><><><><><><><><><><><><><><><><><><>  -  Iron levels - Normal , But    -  Vitamin B12 = 389  is Very Low                     (Ideal or Goal Vit B12 is between 450 - 1,100)   Low Vit B12 may be associated with Anemia , Fatigue,   Peripheral Neuropathy, Dementia, "Brain Fog", & Depression    - Recommend take a sub-lingual form of Vitamin B12 tablet   1,000 to 5,000 mcg tab that you dissolve under your tongue /Daily   - Can get Lavonia Dana - best price at [employer] or on [employer] <><><><><><><><><><><><><><><><><><><><><><><><><><><><><><><><><> <><><><><><><><><><><><><><><><><><><><><><><><><><><><><><><><><>  -  Testosterone level is back ibn Normal Range  ( Thank You [employer]  !  )  <><><><><><><><><><><><><><><><><><><><><><><><><><><><><><><><><>  -  A1c - Normal - No Diabetes  -  Great !  <><><><><><><><><><><><><><><><><><><><><><><><><><><><><><><><><>  -  Vitamin D = 61 - Excellent - Please keep dose same  <><><><><><><><><><><><><><><><><><><><><><><><><><><><><><><><><>  -  All Else - CBC - Kidneys - Electrolytes - Liver - Magnesium & Thyroid    - all  Normal /  OK <><><><><><><><><><><><><><><><><><><><><><><><><><><><><><><><><> <><><><><><><><><><><><><><><><><><><><><><><><><><><><><><><><><>

## 2022-07-29 ENCOUNTER — Encounter: Payer: Self-pay | Admitting: Internal Medicine

## 2022-08-13 ENCOUNTER — Encounter: Payer: Self-pay | Admitting: Neurology

## 2022-08-13 ENCOUNTER — Institutional Professional Consult (permissible substitution): Payer: Self-pay | Admitting: Neurology

## 2022-08-28 ENCOUNTER — Encounter: Payer: Self-pay | Admitting: Pharmacist

## 2022-09-02 ENCOUNTER — Other Ambulatory Visit: Payer: Self-pay | Admitting: Pharmacist

## 2022-09-02 ENCOUNTER — Other Ambulatory Visit: Payer: Self-pay

## 2022-09-02 ENCOUNTER — Other Ambulatory Visit (HOSPITAL_COMMUNITY)
Admission: RE | Admit: 2022-09-02 | Discharge: 2022-09-02 | Disposition: A | Discharge status: Home / Self Care | Payer: 59 | Source: Ambulatory Visit | Attending: Internal Medicine | Admitting: Internal Medicine

## 2022-09-02 ENCOUNTER — Other Ambulatory Visit: Payer: 59

## 2022-09-02 DIAGNOSIS — Z113 Encounter for screening for infections with a predominantly sexual mode of transmission: Secondary | ICD-10-CM | POA: Insufficient documentation

## 2022-09-02 DIAGNOSIS — Z79899 Other long term (current) drug therapy: Secondary | ICD-10-CM

## 2022-09-03 ENCOUNTER — Other Ambulatory Visit: Payer: Self-pay | Admitting: Pharmacist

## 2022-09-03 ENCOUNTER — Encounter: Payer: Self-pay | Admitting: Pharmacist

## 2022-09-03 DIAGNOSIS — Z79899 Other long term (current) drug therapy: Secondary | ICD-10-CM

## 2022-09-03 LAB — URINE CYTOLOGY ANCILLARY ONLY
Chlamydia: NEGATIVE
Comment: NEGATIVE
Comment: NORMAL
Neisseria Gonorrhea: NEGATIVE

## 2022-09-03 LAB — RPR: RPR Ser Ql: NONREACTIVE

## 2022-09-03 LAB — HIV ANTIBODY (ROUTINE TESTING W REFLEX): HIV 1&2 Ab, 4th Generation: NONREACTIVE

## 2022-09-03 MED ORDER — EMTRICITABINE-TENOFOVIR DF 200-300 MG PO TABS: 1.0000 | ORAL_TABLET | Freq: Every day | ORAL | 2 refills | Status: DC

## 2022-09-10 ENCOUNTER — Other Ambulatory Visit: Payer: Self-pay | Admitting: Nurse Practitioner

## 2022-09-10 DIAGNOSIS — F419 Anxiety disorder, unspecified: Secondary | ICD-10-CM

## 2022-09-12 ENCOUNTER — Other Ambulatory Visit: Payer: Self-pay | Admitting: Nurse Practitioner

## 2022-09-12 DIAGNOSIS — I10 Essential (primary) hypertension: Secondary | ICD-10-CM

## 2022-09-17 ENCOUNTER — Other Ambulatory Visit: Payer: Self-pay | Admitting: Nurse Practitioner

## 2022-10-27 ENCOUNTER — Other Ambulatory Visit: Payer: Self-pay | Admitting: Nurse Practitioner

## 2022-10-27 DIAGNOSIS — E782 Mixed hyperlipidemia: Secondary | ICD-10-CM

## 2022-10-28 ENCOUNTER — Ambulatory Visit: Payer: 59 | Admitting: Nurse Practitioner

## 2022-10-29 ENCOUNTER — Other Ambulatory Visit: Payer: Self-pay | Admitting: Nurse Practitioner

## 2022-10-29 ENCOUNTER — Other Ambulatory Visit: Payer: Self-pay | Admitting: Internal Medicine

## 2022-10-29 ENCOUNTER — Ambulatory Visit: Payer: 59 | Admitting: Nurse Practitioner

## 2022-10-29 VITALS — BP 130/70 | HR 97 | Temp 97.3°F | Ht 77.0 in | Wt 285.0 lb

## 2022-10-29 DIAGNOSIS — G4733 Obstructive sleep apnea (adult) (pediatric): Secondary | ICD-10-CM | POA: Diagnosis not present

## 2022-10-29 DIAGNOSIS — F419 Anxiety disorder, unspecified: Secondary | ICD-10-CM

## 2022-10-29 DIAGNOSIS — K219 Gastro-esophageal reflux disease without esophagitis: Secondary | ICD-10-CM

## 2022-10-29 DIAGNOSIS — R09A2 Foreign body sensation, throat: Secondary | ICD-10-CM

## 2022-10-29 DIAGNOSIS — J351 Hypertrophy of tonsils: Secondary | ICD-10-CM

## 2022-10-29 MED ORDER — OMEPRAZOLE MAGNESIUM 20 MG PO TBEC: DELAYED_RELEASE_TABLET | ORAL | 3 refills | Status: DC

## 2022-10-29 MED ORDER — PANTOPRAZOLE SODIUM 40 MG PO TBEC: 40.0000 mg | DELAYED_RELEASE_TABLET | Freq: Every day | ORAL | 0 refills | Status: DC

## 2022-10-29 MED ORDER — LORAZEPAM 1 MG PO TABS: ORAL_TABLET | ORAL | 0 refills | Status: DC

## 2022-10-29 NOTE — Progress Notes (Signed)
Assessment and Plan:  Diagnoses and all orders for this visit:  1. Gastroesophageal reflux disease, unspecified whether esophagitis present/globulus sensation Continue Famotidine Change Omeprazole to Pantoprazole Discussed lifestyle modification:  wt loss, avoid meals 2-3h before bedtime. Consider eliminating food triggers:  chocolate, caffeine, EtOH, acid/spicy food. Refer for EGD (Dr. Adela Lank) d/t un-resolving symptoms.  - pantoprazole (PROTONIX) 40 MG tablet; Take 1 tablet (40 mg total) by mouth daily.  Dispense: 90 tablet; Refill: 0  2. Enlarged tonsils ENT has deferred tonsillectomy at this time Continue to monitor  3. OSA (obstructive sleep apnea) Continue to follow with Dr. Joya San - No Show 07/2022  Orders Placed This Encounter  Procedures   Ambulatory referral to Gastroenterology    Referral Priority:   Routine    Referral Type:   Consultation    Referral Reason:   Specialty Services Required    Number of Visits Requested:   1    Over 20 minutes of exam, counseling, chart review, and critical decision making was performed.   Future Appointments  Date Time Provider Department Center  01/28/2023 10:30 AM Lucky Cowboy, MD [facility] None  07/28/2023  3:00 PM Lucky Cowboy, MD [facility] None    ------------------------------------------------------------------------------------------------------------------   HPI BP 130/70   Pulse 97   Temp (!) 97.3 F (36.3 C)   Ht 6\' 5"  (1.956 m)   Wt 285 lb (129.3 kg)   SpO2 99%   BMI 33.80 kg/m   36 y.o.male presents for continued evaluation of globulus sensation.    Initially seen in office 06/2022 stating hx of GERD, taking Omeprazole and Famotidine but had a break x1 mo.  Symptoms continued.  He thought this may be triggered by swallowing an oyster shell.  He was referred to ENT, (07/08/22) Dr. 09/08/22 for review of tonsillar hypertrophy who advised again tonsillectomy after completing a flexible laryngoscopy  which was performed without complication and negative.  He has been taking the Famotidine and Omeprazole and continues to have the sensation.  He is concerned, stating family hx (secondary relative/grandfather) had esophageal cancer.    He has followed with Dr. Verne Spurr in the past.  Completed colonoscopy 2019.  No EGD.  Noted to have internal hemorrhoids otherwise colon appeared healthy and biopsies did not show any evidence of inflammation, which is excellent news. No recall was noted.  He continues to be a current every day smoker.  He continues to deny hemoptysis, N/V, dysphagia, abd pain.  He has a hx of OSA.  He has not followed with Dr. 2020 for CPAP replacement.   Past Medical History:  Diagnosis Date   Anxiety    Depression    Hidradenitis suppurativa    Hyperlipidemia    Hypertension    Prediabetes      Allergies  Allergen Reactions   Peanut-Containing Drug Products Anaphylaxis   Ceclor [Cefaclor] Hives and Swelling   Sulfa Antibiotics Hives    Current Outpatient Medications on File Prior to Visit  Medication Sig   amLODipine (NORVASC) 10 MG tablet Take 1/2 to 1 tablet Daily for BP                                                              /  TAKE                              BY                              MOUTH   Ascorbic Acid (VITAMIN C) 1000 MG tablet Take 1,000 mg by mouth daily.   aspirin EC 81 MG tablet Take 81 mg by mouth 2 (two) times daily.   cloNIDine (CATAPRES) 0.2 MG tablet TAKE ONE TABLET BY MOUTH THREE TIMES A DAY FOR BLOOD PRESSURE   emtricitabine-tenofovir (TRUVADA) 200-300 MG tablet Take 1 tablet by mouth daily.   famotidine (PEPCID) 20 MG tablet Take  1 tablet  2 x /day  to Prevent Heartburn & Indigestion  /  Patient knows to take by mouth   fish oil-omega-3 fatty acids 1000 MG capsule Take 1 g by mouth 2 (two) times daily.   gabapentin (NEURONTIN) 100 MG capsule TAKE ONE CAPSULE BY MOUTH THREE TIMES DAILY AS NEEDED FOR  PAIN   hydrochlorothiazide (HYDRODIURIL) 25 MG tablet TAKE ONE TABLET BY MOUTH ONE TIME DAILY FOR BLOOD PRESSURE, AND/OR ANKLE SWELLING   hyoscyamine (LEVSIN SL) 0.125 MG SL tablet Dissolve  1 to 2 tablets under tongue3 to 4 x day if needed for Nausea, Vomiting, Cramping, Bloating  or Diarrhea   KLS OMPERAZOLE 20 MG TBEC take 1 tablet by mouth twice a day   lisinopril (ZESTRIL) 20 MG tablet TAKE ONE TABLET BY MOUTH TWICE DAILY FOR BLOOD PRESSURE   LORazepam (ATIVAN) 1 MG tablet TAKE HALF TO ONE TABLET BY MOUTH TWO TO THREE TIMES A DAY IF NEEDED FOR ANXIETY ATTACK AND LIMIT TO 5 DAYS A WEEK TO AVOID ADDICTION/DEMENTIA   Magnesium 400 MG TABS Take 400 mg by mouth daily.   metFORMIN (GLUCOPHAGE-XR) 500 MG 24 hr tablet TAKE ONE OR TWO TABLETS BY MOUTH TWICE DAILY FOR DIABETES   ondansetron (ZOFRAN ODT) 4 MG disintegrating tablet Take 1 tablet (4 mg total) by mouth every 8 (eight) hours as needed for nausea or vomiting.   propranolol (INDERAL) 80 MG tablet TAKE ONE TABLET BY MOUTH THREE TIMES DAILY FOR BLOOD PRESSURE   rosuvastatin (CRESTOR) 20 MG tablet TAKE ONE TABLET BY MOUTH ONE TIME DAILY FOR CHOLESTEROL   sucralfate (CARAFATE) 1 g tablet Take 1 tablet (1 g total) by mouth 4 (four) times daily.   VITAMIN D PO Take 10,000 Units by mouth daily.   zinc gluconate 50 MG tablet Take 50 mg by mouth daily.   nicotine (NICODERM CQ - DOSED IN MG/24 HR) 7 mg/24hr patch Place 1 patch (7 mg total) onto the skin daily. (Patient not taking: Reported on 10/29/2022)   No current facility-administered medications on file prior to visit.    ROS: all negative except above.   Physical Exam:  BP 130/70   Pulse 97   Temp (!) 97.3 F (36.3 C)   Ht 6\' 5"  (1.956 m)   Wt 285 lb (129.3 kg)   SpO2 99%   BMI 33.80 kg/m   General Appearance: Well nourished, in no apparent distress. Eyes: PERRLA, EOMs, conjunctiva no swelling or erythema Sinuses: No Frontal/maxillary tenderness ENT/Mouth: Ext aud canals clear,  TMs without erythema, bulging. No erythema, swelling, or exudate on post pharynx.  Tonsils enlarged. Neck: Supple, thyroid normal.  Respiratory: Respiratory effort normal, BS equal bilaterally without rales, rhonchi, wheezing or stridor.  Cardio: RRR with no MRGs. Brisk peripheral pulses without edema.  Abdomen: Soft, + BS.  Non tender, no guarding, rebound, hernias, masses. Lymphatics: Non tender without lymphadenopathy.  Musculoskeletal: Full ROM, 5/5 strength, normal gait.  Skin: Warm, dry without rashes, lesions, ecchymosis.  Neuro: Cranial nerves intact. Normal muscle tone, no cerebellar symptoms. Sensation intact.  Psych: Awake and oriented X 3, normal affect, Insight and Judgment appropriate.     Adela Glimpse, NP 11:41 AM [facility]

## 2022-11-12 ENCOUNTER — Encounter: Payer: Self-pay | Admitting: Nurse Practitioner

## 2022-11-13 ENCOUNTER — Other Ambulatory Visit: Payer: Self-pay | Admitting: Nurse Practitioner

## 2022-11-13 MED ORDER — PANTOPRAZOLE SODIUM 40 MG PO TBEC: 40.0000 mg | DELAYED_RELEASE_TABLET | Freq: Two times a day (BID) | ORAL | 1 refills | Status: DC

## 2022-11-18 ENCOUNTER — Other Ambulatory Visit: Payer: Self-pay | Admitting: Nurse Practitioner

## 2022-12-08 ENCOUNTER — Other Ambulatory Visit: Payer: Self-pay | Admitting: Pharmacist

## 2022-12-08 DIAGNOSIS — Z79899 Other long term (current) drug therapy: Secondary | ICD-10-CM

## 2022-12-10 ENCOUNTER — Ambulatory Visit: Payer: 59 | Admitting: Nurse Practitioner

## 2022-12-11 ENCOUNTER — Encounter: Payer: Self-pay | Admitting: Internal Medicine

## 2022-12-19 ENCOUNTER — Other Ambulatory Visit: Payer: Self-pay | Admitting: Nurse Practitioner

## 2022-12-19 ENCOUNTER — Other Ambulatory Visit: Payer: Self-pay | Admitting: Internal Medicine

## 2022-12-19 DIAGNOSIS — I10 Essential (primary) hypertension: Secondary | ICD-10-CM

## 2022-12-19 DIAGNOSIS — F419 Anxiety disorder, unspecified: Secondary | ICD-10-CM

## 2022-12-21 ENCOUNTER — Other Ambulatory Visit: Payer: Self-pay | Admitting: Internal Medicine

## 2022-12-21 DIAGNOSIS — K582 Mixed irritable bowel syndrome: Secondary | ICD-10-CM

## 2022-12-21 DIAGNOSIS — I10 Essential (primary) hypertension: Secondary | ICD-10-CM

## 2022-12-21 MED ORDER — LISINOPRIL 20 MG PO TABS: ORAL_TABLET | ORAL | 3 refills | Status: DC

## 2022-12-21 MED ORDER — HYDROCHLOROTHIAZIDE 25 MG PO TABS: ORAL_TABLET | ORAL | 3 refills | Status: DC

## 2022-12-21 MED ORDER — CLONIDINE HCL 0.2 MG PO TABS: ORAL_TABLET | ORAL | 3 refills | Status: DC

## 2022-12-21 MED ORDER — HYOSCYAMINE SULFATE 0.125 MG SL SUBL: SUBLINGUAL_TABLET | SUBLINGUAL | 0 refills | Status: DC

## 2023-01-25 ENCOUNTER — Other Ambulatory Visit: Payer: Self-pay | Admitting: Internal Medicine

## 2023-01-26 ENCOUNTER — Encounter: Payer: Self-pay | Admitting: Internal Medicine

## 2023-01-26 ENCOUNTER — Ambulatory Visit: Payer: 59 | Admitting: Internal Medicine

## 2023-01-26 VITALS — BP 118/68 | HR 87 | Temp 98.8°F | Resp 16 | Ht 77.0 in | Wt 275.6 lb

## 2023-01-26 DIAGNOSIS — K219 Gastro-esophageal reflux disease without esophagitis: Secondary | ICD-10-CM

## 2023-01-26 DIAGNOSIS — Z1152 Encounter for screening for COVID-19: Secondary | ICD-10-CM

## 2023-01-26 DIAGNOSIS — R6889 Other general symptoms and signs: Secondary | ICD-10-CM

## 2023-01-26 DIAGNOSIS — F419 Anxiety disorder, unspecified: Secondary | ICD-10-CM

## 2023-01-26 MED ORDER — BUSPIRONE HCL 10 MG PO TABS: ORAL_TABLET | ORAL | 1 refills | Status: DC

## 2023-01-26 MED ORDER — PANTOPRAZOLE SODIUM 40 MG PO TBEC: 40.0000 mg | DELAYED_RELEASE_TABLET | Freq: Two times a day (BID) | ORAL | 1 refills | Status: DC

## 2023-01-26 NOTE — Progress Notes (Unsigned)
Future Appointments  Date Time Provider Department  04/08/2023  8:10 AM Armbruster, Carlota Raspberry, MD [facility]  04/28/2023 11:30 AM Unk Pinto, MD [facility]  07/28/2023  3:00 PM Unk Pinto, MD [facility]    History of Present Illness:      This very nice 37 y.o. recently married WM  with HTN, HLD, Moderate Obesity, Insulin Resistance /PreDiabetes  and Vitamin D Deficiency presents with a 5 day hx/o chest congestion . 4 days ago went to an Urgent care & apparently had a negative CXR & was Rx'd an Albuterol MDI. Also was prescribed 5 day course of Doxycycline  for "greenish " sputum Reports fever , chills & aching in extremities.  Covid & Flu tests are both Negative today. Does relate feeling somewhat better today.      Current Outpatient Medications on File Prior to Visit  Medication Sig   albuterol (VENTOLIN HFA) 108 (90 Base) MCG/ACT inhaler Inhale 1 puff into the lungs 4 (four) times daily.   amLODipine (NORVASC) 10 MG tablet Take  1/2 to 1 tablet  Daily     Ascorbic Acid (VITAMIN C) 1000 MG tablet Take 1,000 mg by mouth daily.   aspirin EC 81 MG tablet Take 81 mg by mouth 2 (two) times daily.   cetirizine (ZYRTEC) 10 MG tablet 10 mg by oral route.   cloNIDine (CATAPRES) 0.2 MG tablet Take  1 tablet  3 x/day for                BY                                 MOUTH   doxycycline (VIBRAMYCIN) 100 MG capsule Take 100 mg by mouth 2 (two) times daily.   famotidine (PEPCID) 20 MG tablet Take  1 tablet  2 x /day  to Prevent Heartburn & Indigestion  /  Patient knows to take by mouth   fish oil-omega-3 fatty acids 1000 MG capsule Take 1 g by mouth 2 (two) times daily.   gabapentin (NEURONTIN) 100 MG capsule TAKE ONE CAPSULE BY MOUTH THREE TIMES DAILY AS NEEDED FOR PAIN   hydrochlorothiazide (HYDRODIURIL) 25 MG tablet Take  1 tablet  Daily f   hyoscyamine (LEVSIN SL) 0.125 MG SL tablet Dissolve  1 to 2 tablets under tongue3 to 4 x day if needed for Nausea, Vomiting, Cramping,  Bloating  or Diarrhea   ipratropium-albuterol (DUONEB) 0.5-2.5 (3) MG/3ML SOLN Take by nebulization.   lisinopril (ZESTRIL) 20 MG tablet Take  1 tablet   2 x /day     LORazepam (ATIVAN) 1 MG tablet TAKE ONE-HALF TABLET TO ONE TABLET BY MOUTH 2 TO 3 TIMES DAILY IF NEEDED FOR ANXIETY ATTACK   metFORMIN (GLUCOPHAGE-XR) 500 MG 24 hr tablet TAKE ONE OR TWO TABLETS BY MOUTH TWICE DAILY FOR DIABETES   ondansetron (ZOFRAN ODT) 4 MG disintegrating tablet Take 1 tablet (4 mg total) by mouth every 8 (eight) hours as needed for nausea or vomiting.   propranolol (INDERAL) 80 MG tablet TAKE ONE TABLET BY MOUTH THREE TIMES DAILY FOR BLOOD PRESSURE   rosuvastatin (CRESTOR) 20 MG tablet TAKE ONE TABLET BY MOUTH ONE TIME DAILY FOR CHOLESTEROL   VITAMIN D PO Take 10,000 Units by mouth daily.   zinc gluconate 50 MG tablet Take 50 mg by mouth daily.   emtricitabine-tenofovir (TRUVADA) 200-300 MG tablet Take 1 tablet by mouth daily. (Patient not  taking: Reported on 01/26/2023)   Magnesium 400 MG TABS Take 400 mg by mouth daily. (Patient not taking: Reported on 01/26/2023)   sucralfate (CARAFATE) 1 g tablet Take 1 tablet (1 g total) by mouth 4 (four) times daily.   No current facility-administered medications on file prior to visit.    Allergies  Allergen Reactions   Peanut-Containing Drug Products Anaphylaxis   Ceclor [Cefaclor] Hives and Swelling   Sulfa Antibiotics Hives     Problem list He has Essential hypertension; Depression, controlled; OSA (obstructive sleep apnea); Mixed hyperlipidemia; Medication management; Vitamin D deficiency; Genital warts; GERD; Obesity (BMI 30.0-34.9); Testosterone deficiency; and Insulin resistance on their problem list.   Observations/Objective:  BP 118/68   Pulse 87   Temp 98.8 F (37.1 C)   Resp 16   Ht 6\' 5"  (1.956 m)   Wt 275 lb 9.6 oz (125 kg)   SpO2 98%   BMI 32.68 kg/m   HEENT - WNL. Neck - supple.  Chest - Clear equal BS. Cor - Nl HS. RRR w/o sig MGR. PP  1(+). No edema. MS- FROM w/o deformities.  Gait Nl. Neuro -  Nl w/o focal abnormalities.   Assessment and Plan:  1. Flu-like symptoms  - albuterol HFA  inhaler;  Inhale 1 puff  4 ( times daily  - cetirizine  10 MG tablet  - doxycycline  100 MG capsule;  Take  2  times daily.  - ipratropium-albuterol (DUONEB) 0.5-2.5 (3) MG/3ML SOLN;  Take by nebulization. - POCT Influenza A/B - > Negative  2. Encounter for screening for COVID-19  - POC COVID-19 - > Negative  3. Gastroesophageal reflux disease  - pantoprazole 40 MG tablet - refilled per request today  Take 1 tablet  2 (two) times daily.   Dispense: 60 tablet; Refill: 1  4. Chronic anxiety  - Discussed stopping his propranolol he takes for anxiety as it apparently aggravates his Asthma. He is agreeable to try Buspar for anxiety. Relates hx/o ED on SSRI.    - busPIRone (BUSPAR) 10 MG tablet; Take  1/2 to 1 tablet   3 x /day  with Meals for Chronic Anxiety  Dispense: 90 tablet; Refill: 1       Follow Up Instructions:        I discussed the assessment and treatment plan with the patient. The patient was provided an opportunity to ask questions and all were answered. The patient agreed with the plan and demonstrated an understanding of the instructions.       The patient was advised to call back or seek an in-person evaluation if the symptoms worsen or if the condition fails to improve as anticipated.    Kirtland Bouchard, MD

## 2023-01-27 LAB — POCT INFLUENZA A/B
Influenza A, POC: NEGATIVE
Influenza B, POC: NEGATIVE

## 2023-01-27 LAB — POC COVID19 BINAXNOW: SARS Coronavirus 2 Ag: NEGATIVE

## 2023-01-28 ENCOUNTER — Ambulatory Visit: Payer: 59 | Admitting: Internal Medicine

## 2023-03-30 ENCOUNTER — Other Ambulatory Visit: Payer: Self-pay | Admitting: Nurse Practitioner

## 2023-03-30 ENCOUNTER — Other Ambulatory Visit: Payer: Self-pay | Admitting: Internal Medicine

## 2023-03-30 DIAGNOSIS — R6889 Other general symptoms and signs: Secondary | ICD-10-CM

## 2023-03-30 DIAGNOSIS — G8929 Other chronic pain: Secondary | ICD-10-CM

## 2023-04-08 ENCOUNTER — Encounter: Payer: Self-pay | Admitting: Gastroenterology

## 2023-04-08 ENCOUNTER — Other Ambulatory Visit: Payer: Self-pay

## 2023-04-08 ENCOUNTER — Ambulatory Visit: Payer: 59 | Admitting: Gastroenterology

## 2023-04-08 VITALS — BP 122/78 | HR 87 | Ht 77.0 in | Wt 288.0 lb

## 2023-04-08 DIAGNOSIS — R09A2 Foreign body sensation, throat: Secondary | ICD-10-CM | POA: Diagnosis not present

## 2023-04-08 DIAGNOSIS — J302 Other seasonal allergic rhinitis: Secondary | ICD-10-CM

## 2023-04-08 DIAGNOSIS — R6889 Other general symptoms and signs: Secondary | ICD-10-CM

## 2023-04-08 DIAGNOSIS — K219 Gastro-esophageal reflux disease without esophagitis: Secondary | ICD-10-CM | POA: Diagnosis not present

## 2023-04-08 DIAGNOSIS — Z79899 Other long term (current) drug therapy: Secondary | ICD-10-CM

## 2023-04-08 MED ORDER — CETIRIZINE HCL 10 MG PO TABS: 10.0000 mg | ORAL_TABLET | Freq: Every day | ORAL | 1 refills | Status: AC

## 2023-04-08 MED ORDER — PANTOPRAZOLE SODIUM 40 MG PO TBEC: 40.0000 mg | DELAYED_RELEASE_TABLET | Freq: Two times a day (BID) | ORAL | 2 refills | Status: DC

## 2023-04-08 NOTE — Patient Instructions (Addendum)
You have been scheduled for an endoscopy. Please follow written instructions given to you at your visit today. If you use inhalers (even only as needed), please bring them with you on the day of your procedure.  Take Zyrtec every day.  Continue Protonix twice a day.  Thank you for entrusting me with your care and for choosing [facility], Dr. Ileene Patrick    If your blood pressure at your visit was 140/90 or greater, please contact your primary care physician to follow up on this.  _______________________________________________________  If you are age 43 or older, your body mass index should be between 23-30. Your Body mass index is 34.15 kg/m. If this is out of the aforementioned range listed, please consider follow up with your Primary Care Provider.  If you are age 78 or younger, your body mass index should be between 19-25. Your Body mass index is 34.15 kg/m. If this is out of the aformentioned range listed, please consider follow up with your Primary Care Provider.   ________________________________________________________  The [facility] providers would like to encourage you to use [facility] to communicate with providers for non-urgent requests or questions.  Due to long hold times on the telephone, sending your provider a message by [facility] may be a faster and more efficient way to get a response.  Please allow 48 business hours for a response.  Please remember that this is for non-urgent requests.  _______________________________________________________  Due to recent changes in healthcare laws, you may see the results of your imaging and laboratory studies on MyChart before your provider has had a chance to review them.  We understand that in some cases there may be results that are confusing or concerning to you. Not all laboratory results come back in the same time frame and the provider may be waiting for multiple results in order to interpret others.  Please give [place]  48 hours in order for your provider to thoroughly review all the results before contacting the office for clarification of your results.

## 2023-04-08 NOTE — Progress Notes (Signed)
HPI :  37 year old male with a history of GERD, allergies, hemorrhoids, here to reestablish care, last seen in April 2019 by our office.  He is referred here by Lucky Cowboy for globus sensation and GERD.  Patient reports years worth of reflux symptoms.  He has been on numerous PPIs in the past to include Nexium, omeprazole, and most recently Protonix dose at 40 mg twice daily.  He is also been on Pepcid in the past.  Typical reflux symptoms if he does not take anything would be pyrosis and regurgitation.  On Protonix 40 mg twice daily, he does not always take it prior to meals, but this does typically control his pyrosis.  He does continue to have some regurgitation at times that can go up into his throat.  Generally he states the medicine seems to be working okay for his main symptoms that bother him in regards to reflux.  He denies any dysphagia.  He reports about a year ago he was eating oysters and felt perhaps the shell got stuck in his throat which led to coughing.  He states essentially since that time over the past year he has had sensitive throat with globus.  Sensation is not there all the time but most of the time, constantly needing to clear his throat.  He states he feels some mucus in his throat as well with this.  He has seen ENT and had a laryngoscopy and told it was normal.  He does have some seasonal allergies with occasional postnasal drip.  He has been given Zyrtec to take for this but he does not really take it.  He denies any voice changes.  He does smoke tobacco.  He has never had a prior endoscopy but was told he had ulcers a few years ago.  He takes Carafate as needed for epigastric burning.  He denies any major changes in his weight lately.  He does endorse issues with anxiety and is not sure if this globus could be related to that as well.  Recall in 2019 he had a colonoscopy done for rectal bleeding which was thought to be due to internal hemorrhoids, no other concerning  pathology noted at the time.  His grandfather had esophageal cancer, no first-degree family history of that otherwise.  Colonoscopy 06/03/18: normal colon, ileum, internal hemorrhoids - biopsies taken   Surgical [P], random sites - BENIGN COLONIC MUCOSA. - NO SIGNIFICANT INFLAMMATION OR OTHER ABNORMALITIES IDENTIFIED.    Past Medical History:  Diagnosis Date   Anxiety    Depression    GERD (gastroesophageal reflux disease)    Hidradenitis suppurativa    Hyperlipidemia    Hypertension    Prediabetes      Past Surgical History:  Procedure Laterality Date   None     Family History  Problem Relation Age of Onset   Heart disease Maternal Grandmother        Died from bypass surgery   Heart disease Mother        Bicuspid aortic valve   Heart disease Sister        Bicuspid aortic valve   Colitis Sister    Diabetes Sister    Heart disease Father        Bicuspid aortic valve   Breast cancer Paternal Grandmother    Diabetes Paternal Grandmother    Esophageal cancer Paternal Grandfather    Colon cancer Paternal Aunt    Social History   Tobacco Use   Smoking status: Every  Day    Packs/day: 0.60    Years: 10.00    Additional pack years: 0.00    Total pack years: 6.00    Types: Cigarettes   Smokeless tobacco: Never  Vaping Use   Vaping Use: Never used  Substance Use Topics   Alcohol use: Yes    Comment: 3-4 beers a day   Drug use: Not Currently   Current Outpatient Medications  Medication Sig Dispense Refill   amLODipine (NORVASC) 10 MG tablet Take  1/2 to 1 tablet  Daily  for BP                                              /                                          TAKE                                        BY                                  MOUTH 90 tablet 3   Ascorbic Acid (VITAMIN C) 1000 MG tablet Take 1,000 mg by mouth daily.     aspirin EC 81 MG tablet Take 81 mg by mouth 2 (two) times daily.     busPIRone (BUSPAR) 10 MG tablet Take  1/2 to 1 tablet   3 x /day   with Meals for Chronic Anxiety 90 tablet 1   cloNIDine (CATAPRES) 0.2 MG tablet Take  1 tablet  3 x/day for BP                                                                               /                        TAKE                                   BY                                 MOUTH 270 tablet 3   emtricitabine-tenofovir (TRUVADA) 200-300 MG tablet Take 1 tablet by mouth daily. 30 tablet 2   fish oil-omega-3 fatty acids 1000 MG capsule Take 1 g by mouth 2 (two) times daily.     gabapentin (NEURONTIN) 100 MG capsule Take  1 capsule  3 x /day for Chronic Pain                                                                 /  TAKE                                              BY                                 MOUTH 270 capsule 3   hydrochlorothiazide (HYDRODIURIL) 25 MG tablet Take  1 tablet  Daily for BP & Fluid Retention / Ankle Swelling                                          /                                           take                                                            by                                  mouth 90 tablet 3   ipratropium-albuterol (DUONEB) 0.5-2.5 (3) MG/3ML SOLN Take by nebulization.     lisinopril (ZESTRIL) 20 MG tablet Take  1 tablet   2 x /day  for BP                                                                         /                                         take                                   by                             mouth 180 tablet 3   Magnesium 400 MG TABS Take 400 mg by mouth daily.     metFORMIN (GLUCOPHAGE-XR) 500 MG 24 hr tablet Take   2 tablets   2 x /day  with Meals  for Diabetes 360 tablet 3   propranolol (INDERAL) 80 MG tablet TAKE ONE TABLET BY MOUTH THREE TIMES DAILY FOR BLOOD PRESSURE 270 tablet 0   rosuvastatin (CRESTOR) 20 MG tablet TAKE ONE TABLET BY MOUTH ONE TIME DAILY FOR CHOLESTEROL 90 tablet 0   VITAMIN D PO Take 10,000 Units by mouth daily.     zinc gluconate 50  MG  tablet Take 50 mg by mouth daily.     albuterol (VENTOLIN HFA) 108 (90 Base) MCG/ACT inhaler Inhale 1 puff into the lungs 4 (four) times daily. (Patient not taking: Reported on 04/08/2023)     cetirizine (KLS ALLER-TEC) 10 MG tablet Take 1 tablet (10 mg total) by mouth daily. 90 tablet 1   doxycycline (VIBRAMYCIN) 100 MG capsule Take 100 mg by mouth 2 (two) times daily. (Patient not taking: Reported on 04/08/2023)     famotidine (PEPCID) 20 MG tablet Take  1 tablet  2 x /day  to Prevent Heartburn & Indigestion  /  Patient knows to take by mouth (Patient not taking: Reported on 04/08/2023) 180 tablet 3   hyoscyamine (LEVSIN SL) 0.125 MG SL tablet Dissolve  1 to 2 tablets under tongue3 to 4 x day if needed for Nausea, Vomiting, Cramping, Bloating  or Diarrhea (Patient not taking: Reported on 04/08/2023) 90 tablet 0   LORazepam (ATIVAN) 1 MG tablet TAKE ONE-HALF TABLET TO ONE TABLET BY MOUTH 2 TO 3 TIMES DAILY IF NEEDED FOR ANXIETY ATTACK; LIMIT TO 5 DAYS PER WEEK TO AVOID ADDICTION/DEMENTIA (Patient not taking: Reported on 04/08/2023) 40 tablet 0   ondansetron (ZOFRAN ODT) 4 MG disintegrating tablet Take 1 tablet (4 mg total) by mouth every 8 (eight) hours as needed for nausea or vomiting. (Patient not taking: Reported on 04/08/2023) 10 tablet 0   pantoprazole (PROTONIX) 40 MG tablet Take 1 tablet (40 mg total) by mouth 2 (two) times daily. 60 tablet 1   sucralfate (CARAFATE) 1 g tablet Take 1 tablet (1 g total) by mouth 4 (four) times daily. 120 tablet 1   No current facility-administered medications for this visit.   Allergies  Allergen Reactions   Peanut-Containing Drug Products Anaphylaxis   Ceclor [Cefaclor] Hives and Swelling   Sulfa Antibiotics Hives     Review of Systems: All systems reviewed and negative except where noted in HPI.   Lab Results  Component Value Date   WBC 7.1 07/23/2022   HGB 15.3 07/23/2022   HCT 43.7 07/23/2022   MCV 88.5 07/23/2022   PLT 202 07/23/2022    Lab  Results  Component Value Date   CREATININE 0.86 07/23/2022   BUN 12 07/23/2022   NA 138 07/23/2022   K 4.1 07/23/2022   CL 100 07/23/2022   CO2 29 07/23/2022    Lab Results  Component Value Date   ALT 31 07/23/2022   AST 23 07/23/2022   ALKPHOS 37 (L) 09/18/2021   BILITOT 0.6 07/23/2022     Physical Exam: BP 122/78   Pulse 87   Ht 6\' 5"  (1.956 m)   Wt 288 lb (130.6 kg)   BMI 34.15 kg/m  Constitutional: Pleasant,well-developed, male in no acute distress. HEENT: Normocephalic and atraumatic. Conjunctivae are normal. No scleral icterus. Neck supple.  Cardiovascular: Normal rate, regular rhythm.  Pulmonary/chest: Effort normal and breath sounds normal. No wheezing, rales or rhonchi. Abdominal: Soft, nondistended, nontender. . There are no masses palpable.  Extremities: no edema Lymphadenopathy: No cervical adenopathy noted. Neurological: Alert and oriented to person place and time. Skin: Skin is warm and dry. No rashes noted. Psychiatric: Normal mood and affect. Behavior is normal.   ASSESSMENT: 37 y.o. male here for assessment of the following  1. Globus sensation   2. Gastroesophageal reflux disease, unspecified whether esophagitis present   3. Seasonal allergies   4. Long-term current use of proton pump inhibitor therapy   5. Flu-like symptoms  Ongoing globus for the past year in the setting of history of GERD as well as seasonal allergies.  History as outlined.  ENT evaluation negative.  Despite Protonix twice daily dosing controlling most of his typical reflux symptoms, he does have regurgitation that continues to bother him at times.  We discussed differential diagnosis for his globus which could include reflux (particularly nonacid reflux in his case), postnasal drip/allergic rhinitis, versus potential component of anxiety.  Discussed options on how to work this up.  I think an EGD would be reasonable to clarify if he has any erosive changes to his esophagus,  assess for large hiatal hernia that would predispose him to regurgitation etc.  I discussed risk benefits of the procedure and anesthesia with him, he agrees and wishes to proceed.  If he has a large hiatal hernia noted may need to consider an intervention on that, if otherwise he is a TIF candidate, he may consider doing that as well pending his course if he wanted to come off PPI or if regurgitation continue to bother him.  In this light he is agreeable to an EGD.  In the interim recommend he continue his Protonix for now, dosed at twice daily, recommend he take it prior to meals.  I discussed long-term risk benefits of chronic PPI use with him, he appears to understand this and is tolerating it otherwise okay.  He wishes to continue for now.  I otherwise recommend he treat allergic rhinitis more aggressively and take a Zyrtec every single day to see if that may help as well.  He agrees with the plan, further recommendations pending his course and results of endoscopy.   PLAN: - schedule EGD in the [facility] - continue protonix 40mg  BID - take 1/2 hr prior to meal - discussed long term risks of chronic PPI use - he understands and will continue for now - asked to take zyrtec daily to treat component of post nasal drip / allergies - may consider TIF if candidate endoscopically and wants to come off PPI / fails PPI  Harlin Rain, MD [facility]  CC: Lucky Cowboy, MD

## 2023-04-08 NOTE — Progress Notes (Signed)
Protonix 4 mg :Take twice a day 30 minutes before meals

## 2023-04-21 ENCOUNTER — Encounter: Payer: Self-pay | Admitting: Gastroenterology

## 2023-04-21 ENCOUNTER — Ambulatory Visit (AMBULATORY_SURGERY_CENTER): Payer: 59 | Admitting: Gastroenterology

## 2023-04-21 VITALS — BP 120/75 | HR 75 | Temp 99.3°F | Resp 15 | Ht 77.0 in | Wt 288.0 lb

## 2023-04-21 DIAGNOSIS — R09A2 Foreign body sensation, throat: Secondary | ICD-10-CM | POA: Diagnosis not present

## 2023-04-21 DIAGNOSIS — K219 Gastro-esophageal reflux disease without esophagitis: Secondary | ICD-10-CM

## 2023-04-21 DIAGNOSIS — K2289 Other specified disease of esophagus: Secondary | ICD-10-CM | POA: Diagnosis not present

## 2023-04-21 MED ORDER — SODIUM CHLORIDE 0.9 % IV SOLN
500.0000 mL | Freq: Once | INTRAVENOUS | Status: DC
Start: 2023-04-21 — End: 2023-04-21

## 2023-04-21 NOTE — Progress Notes (Signed)
Pt's states no medical or surgical changes since previsit or office visit. 

## 2023-04-21 NOTE — Patient Instructions (Signed)
Await pathology results   YOU HAD AN ENDOSCOPIC PROCEDURE TODAY AT THE [facility]:   Refer to the procedure report that was given to you for any specific questions about what was found during the examination.  If the procedure report does not answer your questions, please call your gastroenterologist to clarify.  If you requested that your care partner not be given the details of your procedure findings, then the procedure report has been included in a sealed envelope for you to review at your convenience later.  YOU SHOULD EXPECT: Some feelings of bloating in the abdomen. Passage of more gas than usual.  Walking can help get rid of the air that was put into your GI tract during the procedure and reduce the bloating. If you had a lower endoscopy (such as a colonoscopy or flexible sigmoidoscopy) you may notice spotting of blood in your stool or on the toilet paper. If you underwent a bowel prep for your procedure, you may not have a normal bowel movement for a few days.  Please Note:  You might notice some irritation and congestion in your nose or some drainage.  This is from the oxygen used during your procedure.  There is no need for concern and it should clear up in a day or so.  SYMPTOMS TO REPORT IMMEDIATELY:  Following upper endoscopy (EGD)  Vomiting of blood or coffee ground material  New chest pain or pain under the shoulder blades  Painful or persistently difficult swallowing  New shortness of breath  Fever of 100F or higher  Black, tarry-looking stools  For urgent or emergent issues, a gastroenterologist can be reached at any hour by calling [phone]. Do not use MyChart messaging for urgent concerns.    DIET:  We do recommend a small meal at first, but then you may proceed to your regular diet.  Drink plenty of fluids but you should avoid alcoholic beverages for 24 hours.  ACTIVITY:  You should plan to take it easy for the rest of today and you should NOT DRIVE  or use heavy machinery until tomorrow (because of the sedation medicines used during the test).    FOLLOW UP: Our staff will call the number listed on your records the next business day following your procedure.  We will call around 7:15- 8:00 am to check on you and address any questions or concerns that you may have regarding the information given to you following your procedure. If we do not reach you, we will leave a message.     If any biopsies were taken you will be contacted by phone or by letter within the next 1-3 weeks.  Please call us at [phone] if you have not heard about the biopsies in 3 weeks.    SIGNATURES/CONFIDENTIALITY: You and/or your care partner have signed paperwork which will be entered into your electronic medical record.  These signatures attest to the fact that that the information above on your After Visit Summary has been reviewed and is understood.  Full responsibility of the confidentiality of this discharge information lies with you and/or your care-partner.  

## 2023-04-21 NOTE — Progress Notes (Signed)
Sedate, gd SR, tolerated procedure well, VSS, report to RN 

## 2023-04-21 NOTE — Op Note (Signed)
[facility] Patient Name: Spencer Love Procedure Date: 04/21/2023 4:05 PM MRN: [id number] Endoscopist: Viviann Spare P. Adela Lank , MD, [id number] Age: 37 Referring MD:  Date of Birth: September 13, 1986 Gender: Male Account #: [id number] Procedure:                Upper GI endoscopy Indications:              history of gastro-esophageal reflux disease - on                            protonix twice daily, control of pyrosis but                            regurgitation still occurs, Globus sensation,                            negative ENT evaluation. Has seasonal allergies,                            allergic to peanuts Medicines:                Monitored Anesthesia Care Procedure:                Pre-Anesthesia Assessment:                           - Prior to the procedure, a History and Physical                            was performed, and patient medications and                            allergies were reviewed. The patient's tolerance of                            previous anesthesia was also reviewed. The risks                            and benefits of the procedure and the sedation                            options and risks were discussed with the patient.                            All questions were answered, and informed consent                            was obtained. Prior Anticoagulants: The patient has                            taken no anticoagulant or antiplatelet agents. ASA                            Grade Assessment: II - A patient with mild systemic  disease. After reviewing the risks and benefits,                            the patient was deemed in satisfactory condition to                            undergo the procedure.                           After obtaining informed consent, the endoscope was                            passed under direct vision. Throughout the                            procedure, the patient's blood pressure,  pulse, and                            oxygen saturations were monitored continuously. The                            GIF [id number] #[id number] was introduced through the                            mouth, and advanced to the second part of duodenum.                            The upper GI endoscopy was accomplished without                            difficulty. The patient tolerated the procedure                            well. Scope In: Scope Out: Findings:                 Esophagogastric landmarks were identified: the                            Z-line was found at 44 cm, the gastroesophageal                            junction was found at 44 cm and the upper extent of                            the gastric folds was found at 45 cm from the                            incisors.                           A 1 cm hiatal hernia was present.                           The gastroesophageal flap valve was visualized  endoscopically and classified as Hill Grade II                            (fold present, opens with respiration).                           Mucosal changes were found in the middle third of                            the esophagus and in the lower third of the                            esophagus - subtle mild furrowing. Biopsies were                            taken with a cold forceps for histology to rule out                            eosinophilic esophagitis.                           Area of ectopic gastric mucosa was found in the                            upper third of the esophagus.                           The exam of the esophagus was otherwise normal.                           The entire examined stomach was normal.                           The examined duodenum was normal. Complications:            No immediate complications. Estimated blood loss:                            Minimal. Estimated Blood Loss:     Estimated blood loss was  minimal. Impression:               - Esophagogastric landmarks identified.                           - 1 cm hiatal hernia.                           - Gastroesophageal flap valve classified as Hill                            Grade II (fold present, opens with respiration).                           - Subtle furrowing in the esophagus. Biopsied to  rule out EoE.                           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                           - Normal stomach.                           - Normal examined duodenum.                           Will await biopsies to rule out EoE. Possible the                            patient is having nonacid reflux causing symptoms.                            He is a candidate for TIF if he wanted to come off                            PPI. Will discuss options with him. Recommendation:           - Patient has a contact number available for                            emergencies. The signs and symptoms of potential                            delayed complications were discussed with the                            patient. Return to normal activities tomorrow.                            Written discharge instructions were provided to the                            patient.                           - Resume previous diet.                           - Continue present medications.                           - Await pathology results with further                            recommendations. Viviann Spare P. Lindsi Bayliss, MD 04/21/2023 4:33:02 PM This report has been signed electronically.

## 2023-04-21 NOTE — Progress Notes (Signed)
Called to room to assist during endoscopic procedure.  Patient ID and intended procedure confirmed with present staff. Received instructions for my participation in the procedure from the performing physician.  

## 2023-04-21 NOTE — Progress Notes (Signed)
History and Physical Interval Note: Patient seen on 04/08/23 - no interval changes. Here for EGD to evaluate history of GERD, globus, on protonix  BID. Feeling well today no complaints otherwise. He wishes to proceed.     04/21/2023 4:10 PM  Spencer Love  has presented today for endoscopic procedure(s), with the diagnosis of  Encounter Diagnoses  Name Primary?   Gastroesophageal reflux disease, unspecified whether esophagitis present Yes   Globus sensation   .  The various methods of evaluation and treatment have been discussed with the patient and/or family. After consideration of risks, benefits and other options for treatment, the patient has consented to  the endoscopic procedure(s).   The patient's history has been reviewed, patient examined, no change in status, stable for surgery.  I have reviewed the patient's chart and labs.  Questions were answered to the patient's satisfaction.    Harlin Rain, MD [facility]

## 2023-04-22 ENCOUNTER — Telehealth: Payer: Self-pay

## 2023-04-22 NOTE — Telephone Encounter (Signed)
Unable to leave message due to mailbox being full.

## 2023-04-22 NOTE — Telephone Encounter (Signed)
Patient returning the call, is doing well.

## 2023-04-25 ENCOUNTER — Other Ambulatory Visit: Payer: Self-pay | Admitting: Internal Medicine

## 2023-04-25 DIAGNOSIS — F419 Anxiety disorder, unspecified: Secondary | ICD-10-CM

## 2023-04-28 ENCOUNTER — Other Ambulatory Visit: Payer: Self-pay

## 2023-04-28 ENCOUNTER — Telehealth: Payer: Self-pay

## 2023-04-28 ENCOUNTER — Ambulatory Visit: Payer: 59 | Admitting: Internal Medicine

## 2023-04-28 DIAGNOSIS — K219 Gastro-esophageal reflux disease without esophagitis: Secondary | ICD-10-CM

## 2023-04-28 LAB — HM DIABETES EYE EXAM

## 2023-04-28 NOTE — Telephone Encounter (Signed)
Dr. Adela Lank, I spoke with patient. He is interested to discuss TIF procedure with Dr. Barron Alvine. I scheduled barium swallow on 05/06/23 at 9 AM, and appointment with Dr. Barron Alvine on 07/01/23 at 8:40 AM. Patient made aware. Thank you!

## 2023-04-28 NOTE — Telephone Encounter (Signed)
-----   Message from Benancio Deeds, MD sent at 04/28/2023  8:05 AM EDT ----- Pathology from the esophagus shows no evidence of EOE, changes consistent with that of reflux.  I think his symptoms are likely due to nonacid reflux.  We had previously discussed options to treat this, he is interested in TIF based on our last conversation.  Options for this are medical therapy versus TIF at this point or surgery.  Can you let him know I discussed his case with Dr. Barron Alvine who performs TIF for our group.  I do think he is a good candidate for this if he wants to see Dr. Barron Alvine for an office visit to discuss this further.  We would want to do a barium swallow to assess his motility before proceeding.  If he is interested, can you book him for a barium swallow and a consult with Dr. Barron Alvine to discuss TIF option?  If he declines this otherwise can you let me know and I can see him back in the office to discuss further.  Thanks

## 2023-04-28 NOTE — Telephone Encounter (Signed)
Wonderful, thank you very much!

## 2023-05-06 ENCOUNTER — Ambulatory Visit (HOSPITAL_COMMUNITY)
Admission: RE | Admit: 2023-05-06 | Discharge: 2023-05-06 | Disposition: A | Discharge status: Home / Self Care | Payer: 59 | Source: Ambulatory Visit | Attending: Gastroenterology | Admitting: Gastroenterology

## 2023-05-06 DIAGNOSIS — K219 Gastro-esophageal reflux disease without esophagitis: Secondary | ICD-10-CM | POA: Insufficient documentation

## 2023-05-15 ENCOUNTER — Other Ambulatory Visit: Payer: Self-pay | Admitting: Nurse Practitioner

## 2023-05-15 DIAGNOSIS — E782 Mixed hyperlipidemia: Secondary | ICD-10-CM

## 2023-05-18 ENCOUNTER — Other Ambulatory Visit: Payer: Self-pay | Admitting: Nurse Practitioner

## 2023-05-18 MED ORDER — PROPRANOLOL HCL 80 MG PO TABS: ORAL_TABLET | ORAL | 0 refills | Status: AC

## 2023-05-26 ENCOUNTER — Ambulatory Visit: Payer: 59 | Admitting: Internal Medicine

## 2023-06-03 ENCOUNTER — Encounter: Payer: Self-pay | Admitting: Internal Medicine

## 2023-06-09 NOTE — Progress Notes (Unsigned)
Future Appointments  Date Time Provider Department  06/10/2023 11:30 AM Lucky Cowboy, MD [facility]  07/01/2023  8:40 AM Shellia Cleverly, DO [facility]  07/28/2023  3:00 PM Lucky Cowboy, MD [facility]    History of Present Illness:       This very nice 37 y.o.male presents for 3 month follow up with HTN, HLD, Moderate Obesity, Insulin Resistance /PreDiabetes and Vitamin D Deficiency.         Patient is treated for HTN  since 2014 & BP has been controlled at home. Today's  . Patient has had no complaints of any cardiac type chest pain, palpitations, dyspnea / orthopnea / PND, dizziness, claudication, or dependent edema.        Hyperlipidemia is controlled with diet &Rosuvastatin . Patient denies myalgias or other med SE's. Last Lipids were  Lab Results  Component Value Date   CHOL 121 07/23/2022   HDL 29 (L) 07/23/2022   LDLCALC 66 07/23/2022   TRIG 184 (H) 07/23/2022   CHOLHDL 4.2 07/23/2022      Also, the patient has history of   Morbid Obesity (BMI 35+/-) and hx/o Pre-Diabetes/Insulin Resistance (A1c 5.3% /elevated Insulin 152 /2015)  and has had no symptoms of reactive hypoglycemia, diabetic polys, paresthesias or visual blurring.  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     Further, the patient also has history of Vitamin D Deficiency  ("23" /2015 and "24" /2016)   and supplements vitamin D . Last vitamin D was  Lab Results  Component Value Date   VD25OH 61 07/23/2022      Current Outpatient Medications on File Prior to Visit  Medication Sig   albuterol (VENTOLIN HFA) 108 (90 Base) MCG/ACT inhaler Inhale 1 puff into the lungs 4 (four) times daily.   amLODipine (NORVASC) 10 MG tablet Take  1/2 to 1 tablet  Daily  for BP                                              /                                           TAKE                                        BY                                  MOUTH   Ascorbic Acid (VITAMIN C) 1000 MG tablet Take 1,000 mg by mouth daily.   aspirin EC 81 MG tablet Take 81 mg by mouth 2 (two) times daily.   busPIRone (BUSPAR) 10 MG tablet Take  1/2 to 1 tablet   3 x /day  as needed  for Chronic Anxiety                                                                     /  TAKE                                                            BY                                               MOUTH   cetirizine (KLS ALLER-TEC) 10 MG tablet Take 1 tablet (10 mg total) by mouth daily.   cloNIDine (CATAPRES) 0.2 MG tablet Take  1 tablet  3 x/day for BP                                                                               /                        TAKE                                   BY                                 MOUTH   emtricitabine-tenofovir (TRUVADA) 200-300 MG tablet Take 1 tablet by mouth daily.   fish oil-omega-3 fatty acids 1000 MG capsule Take 1 g by mouth 2 (two) times daily.   gabapentin (NEURONTIN) 100 MG capsule Take  1 capsule  3 x /day for Chronic Pain                                                                 /                                                         TAKE                                              BY                                 MOUTH   hydrochlorothiazide (HYDRODIURIL) 25  MG tablet Take  1 tablet  Daily for BP & Fluid Retention / Ankle Swelling                                          /  take                                                            by                                  mouth   hyoscyamine (LEVSIN SL) 0.125 MG SL tablet Dissolve  1 to 2 tablets under tongue3 to 4 x day if needed for Nausea, Vomiting, Cramping, Bloating  or Diarrhea (Patient not taking: Reported on 04/08/2023)   ipratropium-albuterol (DUONEB) 0.5-2.5 (3) MG/3ML SOLN Take by nebulization.   lisinopril (ZESTRIL) 20 MG tablet Take  1 tablet   2 x /day  for BP                                                                         /                                         take                                   by                             mouth   LORazepam (ATIVAN) 1 MG tablet TAKE ONE-HALF TABLET TO ONE TABLET BY MOUTH 2 TO 3 TIMES DAILY IF NEEDED FOR ANXIETY ATTACK; LIMIT TO 5 DAYS PER WEEK TO AVOID ADDICTION/DEMENTIA   Magnesium 400 MG TABS Take 400 mg by mouth daily.   metFORMIN (GLUCOPHAGE-XR) 500 MG 24 hr tablet Take   2 tablets   2 x /day  with Meals  for Diabetes   ondansetron (ZOFRAN ODT) 4 MG disintegrating tablet Take 1 tablet (4 mg total) by mouth every 8 (eight) hours as needed for nausea or vomiting. (Patient not taking: Reported on 04/08/2023)   pantoprazole (PROTONIX) 40 MG tablet Take 1 tablet (40 mg total) by mouth 2 (two) times daily before a meal. Take 30 minutes before meals   propranolol (INDERAL) 80 MG tablet TAKE ONE TABLET BY MOUTH THREE TIMES DAILY FOR BLOOD PRESSURE Strength: 80 mg   rosuvastatin (CRESTOR) 20 MG tablet TAKE 1 TABLET BY MOUTH DAILY FOR CHOLESTEROL   sucralfate (CARAFATE) 1 g tablet Take 1 tablet (1 g total) by mouth 4 (four) times daily.   VITAMIN D PO Take 10,000 Units by mouth daily.   zinc gluconate 50 MG tablet Take 50 mg by mouth daily.   No current facility-administered medications on file prior to  visit.      Allergies  Allergen Reactions   Peanut-Containing Drug Products Anaphylaxis   Ceclor [Cefaclor] Hives and Swelling   Sulfa Antibiotics Hives      PMHx:   Past Medical History:  Diagnosis Date   Anxiety    Depression    GERD (gastroesophageal reflux disease)    Hidradenitis suppurativa    Hyperlipidemia    Hypertension    Prediabetes       Immunization History  Administered Date(s) Administered   Hepatitis A, Adult 02/16/2019, 01/24/2020   Hepatitis B, ADULT 02/16/2019   Hepb-cpg 01/24/2020   Influenza Inj Mdck Quad With Preservative 11/01/2019, 12/11/2020   Influenza Split 10/11/2015   Influenza,inj,Quad PF,6+ Mos 10/13/2014   PPD Test 10/11/2015, 04/19/2019, 05/16/2020, 07/23/2021   Tdap 10/11/2015      Past Surgical History:  Procedure Laterality Date   None       FHx:    Reviewed / unchanged   SHx:    Reviewed / unchanged    Systems Review:  Constitutional: Denies fever, chills, wt changes, headaches, insomnia, fatigue, night sweats, change in appetite. Eyes: Denies redness, blurred vision, diplopia, discharge, itchy, watery eyes.  ENT: Denies discharge, congestion, post nasal drip, epistaxis, sore throat, earache, hearing loss, dental pain, tinnitus, vertigo, sinus pain, snoring.  CV: Denies chest pain, palpitations, irregular heartbeat, syncope, dyspnea, diaphoresis, orthopnea, PND, claudication or edema. Respiratory: denies cough, dyspnea, DOE, pleurisy, hoarseness, laryngitis, wheezing.  Gastrointestinal: Denies dysphagia, odynophagia, heartburn, reflux, water brash, abdominal pain or cramps, nausea, vomiting, bloating, diarrhea, constipation, hematemesis, melena, hematochezia  or hemorrhoids. Genitourinary: Denies dysuria, frequency, urgency, nocturia, hesitancy, discharge, hematuria or flank pain. Musculoskeletal: Denies arthralgias, myalgias, stiffness, jt. swelling, pain, limping or strain/sprain.  Skin: Denies pruritus, rash,  hives, warts, acne, eczema or change in skin lesion(s). Neuro: No weakness, tremor, incoordination, spasms, paresthesia or pain. Psychiatric: Denies confusion, memory loss or sensory loss. Endo: Denies change in weight, skin or hair change.  Heme/Lymph: No excessive bleeding, bruising or enlarged lymph nodes.   Physical Exam  There were no vitals taken for this visit.  Appears  well nourished, well groomed  and in no distress.  Eyes: PERRLA, EOMs, conjunctiva no swelling or erythema. Sinuses: No frontal/maxillary tenderness ENT/Mouth: EAC's clear, TM's nl w/o erythema, bulging. Nares clear w/o erythema, swelling, exudates. Oropharynx clear without erythema or exudates. Oral hygiene is good. Tongue normal, non obstructing. Hearing intact.  Neck: Supple. Thyroid not palpable. Car 2+/2+ without bruits, nodes or JVD. Chest: Respirations nl with BS clear & equal w/o rales, rhonchi, wheezing or stridor.  Cor: Heart sounds normal w/ regular rate and rhythm without sig. murmurs, gallops, clicks or rubs. Peripheral pulses normal and equal  without edema.  Abdomen: Soft & bowel sounds normal. Non-tender w/o guarding, rebound, hernias, masses or organomegaly.  Lymphatics: Unremarkable.  Musculoskeletal: Full ROM all peripheral extremities, joint stability, 5/5 strength and normal gait.  Skin: Warm, dry without exposed rashes, lesions or ecchymosis apparent.  Neuro: Cranial nerves intact, reflexes equal bilaterally. Sensory-motor testing grossly intact. Tendon reflexes grossly intact.  Pysch: Alert & oriented x 3.  Insight and judgement nl & appropriate. No ideations.   Assessment and Plan:  - Continue medication, monitor blood pressure at home.  - Continue DASH diet.  Reminder to go to the ER if any CP,  SOB, nausea, dizziness, severe HA, changes vision/speech.  - Continue diet/meds, exercise,& lifestyle modifications.  - Continue monitor periodic cholesterol/liver & renal functions    -  Continue diet, exercise  - Lifestyle modifications.  - Monitor appropriate labs. - Continue supplementation.        Discussed  regular exercise, BP monitoring, weight control to achieve/maintain BMI less than 25 and discussed med and SE's.  Recommended labs to assess /monitor clinical status .  I discussed the assessment and treatment plan with the patient. The patient was provided an opportunity to ask questions and all were answered. The patient agreed with the plan and demonstrated an understanding of the instructions.  I provided over 30 minutes of exam, counseling, chart review and  complex critical decision making.        The patient was advised to call back or seek an in-person evaluation if the symptoms worsen or if the condition fails to improve as anticipated.   Marinus Maw, MD

## 2023-06-10 ENCOUNTER — Encounter: Payer: Self-pay | Admitting: Internal Medicine

## 2023-06-10 ENCOUNTER — Ambulatory Visit (INDEPENDENT_AMBULATORY_CARE_PROVIDER_SITE_OTHER): Payer: 59 | Admitting: Internal Medicine

## 2023-06-10 VITALS — BP 110/68 | HR 71 | Temp 97.8°F | Resp 17 | Ht 77.0 in | Wt 295.6 lb

## 2023-06-10 DIAGNOSIS — R7309 Other abnormal glucose: Secondary | ICD-10-CM

## 2023-06-10 DIAGNOSIS — Z79899 Other long term (current) drug therapy: Secondary | ICD-10-CM

## 2023-06-10 DIAGNOSIS — I10 Essential (primary) hypertension: Secondary | ICD-10-CM

## 2023-06-10 DIAGNOSIS — E559 Vitamin D deficiency, unspecified: Secondary | ICD-10-CM | POA: Diagnosis not present

## 2023-06-10 DIAGNOSIS — E782 Mixed hyperlipidemia: Secondary | ICD-10-CM | POA: Diagnosis not present

## 2023-06-10 DIAGNOSIS — E88819 Insulin resistance, unspecified: Secondary | ICD-10-CM | POA: Diagnosis not present

## 2023-06-10 DIAGNOSIS — R7303 Prediabetes: Secondary | ICD-10-CM

## 2023-06-10 NOTE — Patient Instructions (Signed)

## 2023-06-11 LAB — LIPID PANEL
Cholesterol: 114 mg/dL (ref <200)
HDL: 28 mg/dL — ABNORMAL LOW (ref >=40)
LDL Cholesterol (Calc): 55 mg/dL (calc)
Non-HDL Cholesterol (Calc): 86 mg/dL (calc) (ref <130)
Total CHOL/HDL Ratio: 4.1 (calc) (ref <5.0)
Triglycerides: 262 mg/dL — ABNORMAL HIGH (ref <150)

## 2023-06-11 LAB — COMPLETE METABOLIC PANEL WITH GFR
AG Ratio: 2.4 (calc) (ref 1.0–2.5)
ALT: 41 U/L (ref 9–46)
AST: 25 U/L (ref 10–40)
Albumin: 4.5 g/dL (ref 3.6–5.1)
Alkaline phosphatase (APISO): 42 U/L (ref 36–130)
BUN: 13 mg/dL (ref 7–25)
CO2: 28 mmol/L (ref 20–32)
Calcium: 9.3 mg/dL (ref 8.6–10.3)
Chloride: 103 mmol/L (ref 98–110)
Creat: 0.95 mg/dL (ref 0.60–1.26)
Globulin: 1.9 g/dL (calc) (ref 1.9–3.7)
Glucose, Bld: 95 mg/dL (ref 65–99)
Potassium: 4.3 mmol/L (ref 3.5–5.3)
Sodium: 140 mmol/L (ref 135–146)
Total Bilirubin: 0.5 mg/dL (ref 0.2–1.2)
Total Protein: 6.4 g/dL (ref 6.1–8.1)
eGFR: 107 mL/min/{1.73_m2} (ref >=60)

## 2023-06-11 LAB — CBC WITH DIFFERENTIAL/PLATELET
Absolute Monocytes: 547 cells/uL (ref 200–950)
Basophils Absolute: 53 cells/uL (ref 0–200)
Basophils Relative: 0.7 %
Eosinophils Absolute: 190 cells/uL (ref 15–500)
Eosinophils Relative: 2.5 %
HCT: 41.5 % (ref 38.5–50.0)
Hemoglobin: 14.5 g/dL (ref 13.2–17.1)
Lymphs Abs: 2440 cells/uL (ref 850–3900)
MCH: 30.5 pg (ref 27.0–33.0)
MCHC: 34.9 g/dL (ref 32.0–36.0)
MCV: 87.4 fL (ref 80.0–100.0)
MPV: 11.4 fL (ref 7.5–12.5)
Monocytes Relative: 7.2 %
Neutro Abs: 4370 cells/uL (ref 1500–7800)
Neutrophils Relative %: 57.5 %
Platelets: 173 10*3/uL (ref 140–400)
RBC: 4.75 10*6/uL (ref 4.20–5.80)
RDW: 12.8 % (ref 11.0–15.0)
Total Lymphocyte: 32.1 %
WBC: 7.6 10*3/uL (ref 3.8–10.8)

## 2023-06-11 LAB — MAGNESIUM: Magnesium: 2.1 mg/dL (ref 1.5–2.5)

## 2023-06-11 LAB — INSULIN, RANDOM: Insulin: 53.7 u[IU]/mL — ABNORMAL HIGH

## 2023-06-11 LAB — TSH: TSH: 1.01 mIU/L (ref 0.40–4.50)

## 2023-06-11 LAB — HEMOGLOBIN A1C
Hgb A1c MFr Bld: 5.1 % of total Hgb (ref <5.7)
Mean Plasma Glucose: 100 mg/dL
eAG (mmol/L): 5.5 mmol/L

## 2023-06-11 LAB — VITAMIN D 25 HYDROXY (VIT D DEFICIENCY, FRACTURES): Vit D, 25-Hydroxy: 29 ng/mL — ABNORMAL LOW (ref 30–100)

## 2023-06-12 ENCOUNTER — Other Ambulatory Visit: Payer: Self-pay | Admitting: Internal Medicine

## 2023-06-12 NOTE — Progress Notes (Signed)
^<^<^<^<^<^<^<^<^<^<^<^<^<^<^<^<^<^<^<^<^<^<^<^<^<^<^<^<^<^<^<^<^<^<^<^<^ ^>^>^>^>^>^>^>^>^>^>^>>^>^>^>^>^>^>^>^>^>^>^>^>^>^>^>^>^>^>^>^>^>^>^>^>^>  -  Test results slightly outside the reference range are not unusual. If there is anything important, I will review this with you,  otherwise it is considered normal test values.  If you have further questions,  please do not hesitate to contact me at the office or via My Chart.   ^<^<^<^<^<^<^<^<^<^<^<^<^<^<^<^<^<^<^<^<^<^<^<^<^<^<^<^<^<^<^<^<^<^<^<^<^ ^>^>^>^>^>^>^>^>^>^>^>^>^>^>^>^>^>^>^>^>^>^>^>^>^>^>^>^>^>^>^>^>^>^>^>^>^  - Chol = 114   & LDL = 55 - Both  Excellent   - Very low risk for Heart Attack  / Stroke  ^>^>^>^>^>^>^>^>^>^>^>^>^>^>^>^>^>^>^>^>^>^>^>^>^>^>^>^>^>^>^>^>^>^>^>^>^ ^>^>^>^>^>^>^>^>^>^>^>^>^>^>^>^>^>^>^>^>^>^>^>^>^>^>^>^>^>^>^>^>^>^>^>^>^  -  But Triglycerides ( = 262 ) or fats in blood are too high                 (   Ideal or  Goal is less than 150  !  )    - Recommend avoid fried & greasy foods,  sweets / candy,   - Avoid white rice  (brown or wild rice or Quinoa is OK),   - Avoid white potatoes  (sweet potatoes are OK)   - Avoid anything made from white flour  - bagels, doughnuts, rolls, buns, biscuits, white and   wheat breads, pizza crust and traditional  pasta made of white flour & egg white  - (vegetarian pasta or spinach or wheat pasta is OK).    - Multi-grain bread is OK - like multi-grain flat bread or  sandwich thins.   - Avoid alcohol in excess.   - Exercise is also important. ^<^<^<^<^<^<^<^<^<^<^<^<^<^<^<^<^<^<^<^<^<^<^<^<^<^<^<^<^<^<^<^<^<^<^<^<^ ^>^>^>^>^>^>^>^>^>^>^>^>^>^>^>^>^>^>^>^>^>^>^>^>^>^>^>^>^>^>^>^>^>^>^>^>^  -  Vitamin D = 29 - Extremely LOW !   - Vitamin D goal is between 70-100.   - Please make sure that you are taking your Vitamin D as directed.   - It is very important as a natural anti-inflammatory and helping the                           immune system protect  against viral infections, like the Covid-19    helping hair, skin, and nails, as well as reducing stroke and heart attack risk.   - It helps your bones and helps with mood.  - It also decreases numerous cancer risks so please                                                                                           take it as directed.   - Low Vit D is associated with a 200-300% higher risk for CANCER   and 200-300% higher risk for HEART   ATTACK  &  STROKE.    - It is also associated with higher death rate at younger ages,   autoimmune diseases like Rheumatoid arthritis, Lupus, Multiple Sclerosis.     - Also many other serious conditions, like depression, Alzheimer's  Dementia,  muscle aches, fatigue, fibromyalgia   ^>^>^>^>^>^>^>^>^>^>^>^>^>^>^>^>^>^>^>^>^>^>^>^>^>^>^>^>^>^>^>^>^>^>^>^>^  - A1c = 5.1% - is at goal -    - All Else - CBC - Kidneys - Electrolytes - Liver - Magnesium & Thyroid    - all  Normal / OK ===========================================================  OK

## 2023-07-01 ENCOUNTER — Ambulatory Visit: Payer: 59 | Admitting: Gastroenterology

## 2023-07-09 ENCOUNTER — Other Ambulatory Visit (HOSPITAL_COMMUNITY): Payer: Self-pay

## 2023-07-09 ENCOUNTER — Telehealth: Payer: Self-pay

## 2023-07-09 NOTE — Telephone Encounter (Addendum)
*  Gastro  PA request received for Pantoprazole Sodium 40MG  dr tablets  PA submitted to [employer] via [facility] and is pending additional questions/determination  [id number]  *patient has been previously on Esomeprazole, Omeprazole, Famotidine

## 2023-07-14 MED ORDER — LANSOPRAZOLE 15 MG PO CPDR: 15.0000 mg | DELAYED_RELEASE_CAPSULE | Freq: Two times a day (BID) | ORAL | 2 refills | Status: DC

## 2023-07-14 NOTE — Addendum Note (Signed)
Addended by: Cooper Render on: 07/14/2023 05:35 PM   Modules accepted: Orders

## 2023-07-14 NOTE — Telephone Encounter (Signed)
PA has been DENIED:   Document: EPA_ETC_V 1 Decision Notes: This drug is not on our list of covered drugs, also known as a formulary. Our Coverage Determinations - Exceptions policy is used to decide if a not-covered drug can be approved. The conditions in this policy have not been met. From the records that we have received, these reasons caused the denial: 1) All covered drugs used for your health issue have not been tried and failed. Other drugs that can be used are [place] omeprazole (TRIED), rabeprazole, [place] lansoprazole or lansoprazole.

## 2023-07-14 NOTE — Telephone Encounter (Signed)
Okay.  The patient had been on pantoprazole for some time.  Can you let him know his insurance company does not appear to be covering this.  We can either put him on lansoprazole 15mg  twice daily or aciphex 40mg  twice daily if he would like to try these as an alternative to pantoprazole. He is scheduled to see Dr. Barron Alvine for a TIF evaluation in a few months otherwise. Thanks

## 2023-07-14 NOTE — Telephone Encounter (Signed)
 Please see note below. 

## 2023-07-14 NOTE — Telephone Encounter (Signed)
Called patient and explained PA denial. He indicated he has tried and failed famotidine but not Prevacid (lansoprazole).  He said he has been getting his pantoprazole for about $15. He will contact [facility] and see how expensive it is.  He agrees to try lansoprazole 15 mg BID if pantoprazole is too expensive. He understands to let us know if it does not manage his Sx and we can retry a PA for pantoprazole after 30 days

## 2023-07-28 ENCOUNTER — Encounter: Payer: 59 | Admitting: Internal Medicine

## 2023-08-06 ENCOUNTER — Ambulatory Visit: Payer: 59 | Admitting: Nurse Practitioner

## 2023-08-06 ENCOUNTER — Ambulatory Visit (INDEPENDENT_AMBULATORY_CARE_PROVIDER_SITE_OTHER): Payer: 59 | Admitting: Nurse Practitioner

## 2023-08-06 ENCOUNTER — Encounter: Payer: Self-pay | Admitting: Nurse Practitioner

## 2023-08-06 VITALS — BP 120/64 | HR 82 | Temp 97.5°F | Ht 77.0 in | Wt 293.0 lb

## 2023-08-06 DIAGNOSIS — M5441 Lumbago with sciatica, right side: Secondary | ICD-10-CM

## 2023-08-06 DIAGNOSIS — E669 Obesity, unspecified: Secondary | ICD-10-CM | POA: Diagnosis not present

## 2023-08-06 DIAGNOSIS — M5442 Lumbago with sciatica, left side: Secondary | ICD-10-CM

## 2023-08-06 MED ORDER — PREDNISONE 10 MG PO TABS
ORAL_TABLET | ORAL | 0 refills | Status: AC
Start: 2023-08-06 — End: ?

## 2023-08-06 NOTE — Progress Notes (Signed)
Assessment and Plan:  Wendy Guo was seen today for an episodic visit.  Diagnoses and all order for this visit:  1. Acute bilateral low back pain with bilateral sciatica Start steroid taper Light duty work for the next 3-5 days  Brace support Stretching exercises discussed Alternate ice/heat Continue to monitor If s/s fail to improve discussed further imaging for underlying etiology.  - predniSONE (DELTASONE) 10 MG tablet; 1 tab 3 x day for 2 days, then 1 tab 2 x day for 2 days, then 1 tab 1 x day for 3 days  Dispense: 13 tablet; Refill: 0  2. Obesity (BMI 30.0-34.9) Discussed appropriate BMI Diet modification. Physical activity. Encouraged/praised to build confidence.   Notify office for further evaluation and treatment, questions or concerns if s/s fail to improve. The risks and benefits of my recommendations, as well as other treatment options were discussed with the patient today. Questions were answered.  Further disposition pending results of labs. Discussed med's effects and SE's.    Over 15 minutes of exam, counseling, chart review, and critical decision making was performed.   Future Appointments  Date Time Provider Department Center  09/29/2023  8:40 AM Shellia Cleverly, DO [facility]  09/29/2023  3:00 PM Lucky Cowboy, MD [facility] None    ------------------------------------------------------------------------------------------------------------------   HPI BP 120/64   Pulse 82   Temp (!) 97.5 F (36.4 C)   Ht 6\' 5"  (1.956 m)   Wt 293 lb (132.9 kg)   SpO2 98%   BMI 34.74 kg/m    Patient complains of acute low back pain. This is evaluated as a personal injury. The patient first noted symptoms 2 weeks ago. It was not related to a remote injury. The pain is rated severe, intense, and is located at the right lumbar area, left lumbar area, right sacroiliac area, left sacroiliac area, right gluteal area, left gluteal area, or across the lower  back. The pain is described as aching, stabbing, and throbbing and occurs all day. The symptoms has been progressive. Symptoms are exacerbated by exercise, flexion, lifting, sitting, sneezing, standing, and straining. Factors which relieve the pain include muscle relaxants, NSAIDs, rest, sleep, and stretching. Other associated symptoms include no other symptoms.  Treatment efforts have included rest, ice, OTC NSAIDS, home exercises, and gabapentin without relief.  BMI is Body mass index is 34.74 kg/m., he has been working on diet and exercise. Wt Readings from Last 3 Encounters:  08/06/23 293 lb (132.9 kg)  06/10/23 295 lb 9.6 oz (134.1 kg)  04/21/23 288 lb (130.6 kg)      Past Medical History:  Diagnosis Date   Anxiety    Depression    GERD (gastroesophageal reflux disease)    Hidradenitis suppurativa    Hyperlipidemia    Hypertension    Prediabetes      Allergies  Allergen Reactions   Peanut-Containing Drug Products Anaphylaxis   Ceclor [Cefaclor] Hives and Swelling   Sulfa Antibiotics Hives    Current Outpatient Medications on File Prior to Visit  Medication Sig   albuterol (VENTOLIN HFA) 108 (90 Base) MCG/ACT inhaler Inhale 1 puff into the lungs 4 (four) times daily.   amLODipine (NORVASC) 10 MG tablet Take  1/2 to 1 tablet  Daily  for BP                                              /  TAKE                                        BY                                  MOUTH   Ascorbic Acid (VITAMIN C) 1000 MG tablet Take 1,000 mg by mouth daily.   aspirin EC 81 MG tablet Take 81 mg by mouth 2 (two) times daily.   busPIRone (BUSPAR) 10 MG tablet Take  1/2 to 1 tablet   3 x /day  as needed  for Chronic Anxiety                                                                     /  TAKE                                                            BY                                               MOUTH   cetirizine (KLS ALLER-TEC) 10 MG tablet Take 1 tablet (10 mg total) by mouth daily.   cloNIDine (CATAPRES) 0.2 MG tablet Take  1 tablet  3 x/day for BP                                                                               /                        TAKE                                   BY                                 MOUTH   fish oil-omega-3 fatty acids 1000 MG capsule Take 1 g by mouth 2 (two) times daily.   gabapentin (NEURONTIN) 100 MG capsule Take  1 capsule  3 x /day for Chronic Pain                                                                 /                                                         TAKE                                              BY                                 MOUTH   hydrochlorothiazide (HYDRODIURIL) 25 MG tablet Take  1 tablet  Daily for BP & Fluid Retention / Ankle Swelling                                          /  take                                                            by                                  mouth   hyoscyamine (LEVSIN SL) 0.125 MG SL tablet Dissolve  1 to 2 tablets under tongue3 to 4 x day if needed for Nausea, Vomiting, Cramping, Bloating  or Diarrhea   ipratropium-albuterol (DUONEB) 0.5-2.5 (3) MG/3ML SOLN Take by nebulization.   lansoprazole (PREVACID) 15 MG capsule Take 1 capsule (15 mg total) by mouth 2 (two) times daily.   lisinopril (ZESTRIL) 20 MG tablet Take  1 tablet   2 x /day   for BP                                                                         /                                         take                                   by                             mouth   Magnesium 400 MG TABS Take 400 mg by mouth daily.   metFORMIN (GLUCOPHAGE-XR) 500 MG 24 hr tablet Take   2 tablets   2 x /day  with Meals  for Diabetes   ondansetron (ZOFRAN ODT) 4 MG disintegrating tablet Take 1 tablet (4 mg total) by mouth every 8 (eight) hours as needed for nausea or vomiting.   pantoprazole (PROTONIX) 40 MG tablet Take 1 tablet (40 mg total) by mouth 2 (two) times daily before a meal. Take 30 minutes before meals   propranolol (INDERAL) 80 MG tablet TAKE ONE TABLET BY MOUTH THREE TIMES DAILY FOR BLOOD PRESSURE Strength: 80 mg   rosuvastatin (CRESTOR) 20 MG tablet TAKE 1 TABLET BY MOUTH DAILY FOR CHOLESTEROL   VITAMIN D PO Take 10,000 Units by mouth daily.   zinc gluconate 50 MG tablet Take 50 mg by mouth daily.   sucralfate (CARAFATE) 1 g tablet Take 1 tablet (1 g total) by mouth 4 (four) times daily.   No current facility-administered medications on file prior to visit.    ROS: all negative except what is noted in the HPI.   Physical Exam:  BP 120/64   Pulse 82   Temp (!) 97.5 F (36.4 C)   Ht 6\' 5"  (1.956 m)   Wt 293 lb (132.9 kg)   SpO2 98%   BMI 34.74 kg/m   General Appearance: NAD.  Awake, conversant and cooperative.  Eyes: PERRLA, EOMs intact.  Sclera white.  Conjunctiva without erythema. Sinuses: No frontal/maxillary tenderness.  No nasal discharge. Nares patent.  ENT/Mouth: Ext aud canals clear.  Bilateral TMs w/DOL and without erythema or bulging. Hearing intact.  Posterior pharynx without swelling or exudate.  Tonsils without swelling or erythema.  Neck: Supple.  No masses, nodules or thyromegaly. Respiratory: Effort is regular with non-labored breathing. Breath sounds are equal bilaterally without rales, rhonchi, wheezing or stridor.  Cardio: RRR with no  MRGs. Brisk peripheral pulses without edema.  Abdomen: Active BS in all four quadrants.  Soft and non-tender without guarding, rebound tenderness, hernias or masses. Lymphatics: Non tender without lymphadenopathy.  Musculoskeletal: Full ROM, 5/5 strength, normal ambulation.  No clubbing or cyanosis. Skin: Appropriate color for ethnicity. Warm without rashes, lesions, ecchymosis, ulcers.  Neuro: CN II-XII grossly normal. Normal muscle tone without cerebellar symptoms and intact sensation.   Psych: AO X 3,  appropriate mood and affect, insight and judgment.     Adela Glimpse, NP 11:41 AM [facility]

## 2023-08-06 NOTE — Patient Instructions (Signed)
Lumbosacral Strain A lumbosacral strain is an injury that causes pain in the lower back (lumbosacral spine). This injury usually happens from overstretching the muscles or ligaments along the spine. Ligaments are cord-like tissues that connect bones to each other. A strain can affect one or more muscles or ligaments. What are the causes? This condition may be caused by: A hard, direct hit to the back. Overstretching the lower back muscles. This may result from: A fall. Lifting something heavy. Repeated movements such as bending or crouching. What increases the risk? The following factors make you more likely to have a lumbosacral strain: Taking part in sports or activities that involve: A sudden twist of the back. Pushing or pulling motions. Being overweight or obese. Having poor strength and flexibility, especially tight hamstrings or weak muscles in the back or abdomen. Having too much of a curve in the lower back. Having a pelvis that is tilted forward. What are the signs or symptoms? The main symptom of this condition is pain in the lower back, at the site of the strain. Pain may also be felt down one or both legs. How is this diagnosed? This condition is diagnosed based on: Your symptoms and medical history. A physical exam. During the exam: Your health care provider may push on certain areas of your back to find the source of your pain. You may be asked to bend forward, backward, and side to side to check your pain and range of motion. You may also have imaging tests, such as X-rays and an MRI. How is this treated? This condition may be treated by: Applying heat and cold to the affected area. Taking medicines for pain and to relax your muscles. Taking NSAIDs, such as ibuprofen, to help reduce swelling and discomfort. Doing stretching and strengthening exercises for your lower back. Symptoms usually improve within several weeks of treatment. But recovery time varies. When your  symptoms improve, gradually return to your normal routine as soon as possible. This will help reduce pain, avoid stiffness, and keep muscle strength. Follow these instructions at home: Medicines Take over-the-counter and prescription medicines only as told by your health care provider. Ask your health care provider if the medicine prescribed to you: Requires you to avoid driving or using machinery. Can cause constipation. You may need to take these actions to prevent or treat constipation: Drink enough fluid to keep your urine pale yellow. Take over-the-counter or prescription medicines. Eat foods that are high in fiber, such as beans, whole grains, and fresh fruits and vegetables. Limit foods that are high in fat and processed sugars, such as fried or sweet foods. Managing pain, stiffness, and swelling     If told, put ice on the injured area. Put ice in a plastic bag. Place a towel between your skin and the bag. Leave the ice on for 20 minutes, 2-3 times a day. If told, apply heat to the affected area as often as told by your health care provider. Use the heat source that your health care provider recommends, such as a moist heat pack or a heating pad. Place a towel between your skin and the heat source. Leave the heat on for 20-30 minutes. If your skin turns bright red, remove the heat or ice right away to prevent skin damage. The risk of damage is higher if you cannot feel pain, heat, or cold. Activity Rest as told by your health care provider. Do not stay in bed. Staying in bed for more than 1-2 days  can delay your recovery. Return to your normal activities as told by your health care provider. Ask your health care provider what activities are safe for you. Avoid activities that take a lot of energy for as long as told by your health care provider. Do exercises as told by your health care provider. This includes stretching and strengthening exercises. General instructions      Use good posture when sitting and standing. Avoid leaning forward when you sit, and avoid hunching over when you stand. Do not use any products that contain nicotine or tobacco. These products include cigarettes, chewing tobacco, and vaping devices, such as e-cigarettes. If you need help quitting, ask your health care provider. How is this prevented?  Warm up properly before physical activity, and cool down and stretch after being active. Use correct form when playing sports. Bend your knees and use correct posture when lifting heavy objects. Maintain a healthy weight. Sleep on a mattress with medium firmness to support your back. Do at least 150 minutes of moderate-intensity exercise each week, such as brisk walking or water aerobics. Try a form of exercise that takes stress off your back, such as swimming or stationary cycling. Maintain physical fitness, including: Strength. Flexibility. Contact a health care provider if: Your back pain does not improve after several weeks of treatment. Your symptoms get worse. You have a fever. Get help right away if: Your back pain is severe. You cannot stand or walk. You feel nauseous or you vomit. You develop any of the following: Trouble controlling when you urinate or when you have a bowel movement. Pain in your legs. Your feet or legs get very cold, turn pale, or look blue. Weakness in your buttocks or legs. This information is not intended to replace advice given to you by your health care provider. Make sure you discuss any questions you have with your health care provider. Document Revised: 04/20/2023 Document Reviewed: 07/08/2022 Elsevier Patient Education  2024 [employer].

## 2023-08-20 ENCOUNTER — Other Ambulatory Visit: Payer: Self-pay | Admitting: Nurse Practitioner

## 2023-08-20 DIAGNOSIS — E782 Mixed hyperlipidemia: Secondary | ICD-10-CM

## 2023-08-21 NOTE — Telephone Encounter (Signed)
[facility] pharmacy rep called to ask if the Lansoprazole can be change to a quantity of 42 so they can cover it. Please call [phone].

## 2023-08-22 MED ORDER — LANSOPRAZOLE 15 MG PO CPDR: 15.0000 mg | DELAYED_RELEASE_CAPSULE | Freq: Two times a day (BID) | ORAL | 5 refills | Status: DC

## 2023-08-22 NOTE — Addendum Note (Signed)
Addended by: Cooper Render on: 08/22/2023 07:40 PM   Modules accepted: Orders

## 2023-08-22 NOTE — Telephone Encounter (Signed)
Lansoprazole #42 sent to [facility] - required by insurance

## 2023-09-16 ENCOUNTER — Encounter: Payer: Self-pay | Admitting: Nurse Practitioner

## 2023-09-16 ENCOUNTER — Telehealth: Payer: Self-pay | Admitting: Nurse Practitioner

## 2023-09-16 ENCOUNTER — Other Ambulatory Visit: Payer: Self-pay | Admitting: Nurse Practitioner

## 2023-09-16 MED ORDER — METHOCARBAMOL 750 MG PO TABS: 750.0000 mg | ORAL_TABLET | Freq: Three times a day (TID) | ORAL | 1 refills | Status: DC | PRN

## 2023-09-16 NOTE — Telephone Encounter (Signed)
Pt is calling back to let you know his back is hurting worse. How would you like to proceed?

## 2023-09-17 ENCOUNTER — Ambulatory Visit: Payer: 59 | Admitting: Internal Medicine

## 2023-09-17 MED ORDER — MELOXICAM 7.5 MG PO TABS: 7.5000 mg | ORAL_TABLET | Freq: Two times a day (BID) | ORAL | 1 refills | Status: DC

## 2023-09-17 MED ORDER — METHOCARBAMOL 750 MG PO TABS: 750.0000 mg | ORAL_TABLET | Freq: Three times a day (TID) | ORAL | 1 refills | Status: DC | PRN

## 2023-09-17 NOTE — Progress Notes (Signed)
[facility]                                                                                                                                                                       Future Appointments  Date Time Provider Department Center  09/17/2023 11:30 AM Lucky Cowboy, MD [facility] None  09/29/2023  8:40 AM Shellia Cleverly, DO [facility]  09/29/2023  3:00 PM Lucky Cowboy, MD [facility] None    History of Present Illness:                                        This very nice 36 y.o.MWM presents for 3 month follow up with HTN, HLD, Moderate Obesity, Insulin Resistance /PreDiabetes and Vitamin D Deficiency who presents with c/o LBP w/o sciatica.       Current Outpatient Medications on File Prior to Visit  Medication Sig   albuterol (VENTOLIN HFA) 108 (90 Base) MCG/ACT inhaler Inhale 1 puff into the lungs 4 (four) times daily.   amLODipine (NORVASC) 10 MG tablet Take  1/2 to 1 tablet  Daily     Ascorbic Acid (VITAMIN C) 1000 MG tablet Take 1,000 mg by mouth daily.   aspirin EC 81 MG tablet Take 81 mg by mouth 2 (two) times daily.   busPIRone (BUSPAR) 10 MG tablet Take  1/2 to 1 tablet   3 x /day  as needed     cetirizine (KLS ALLER-TEC) 10 MG tablet Take 1 tablet (10 mg total) by mouth daily.   cloNIDine (CATAPRES) 0.2 MG tablet Take  1 tablet  3 x/day    fish oil-omega-3 fatty acids 1000 MG capsule Take 1 g by mouth 2 (two) times daily.   gabapentin (NEURONTIN) 100 MG capsule Take  1 capsule  3 x /day for Chronic Pain            hydrochlorothiazide (25 MG tablet Take  1 tablet  Daily    hyoscyamine SL 0.125 MG SL tablet Dissolve  1 to 2 tablets under tongue3 to 4 x day if needed for Nausea, Vomiting, Cramping, Bloating  or Diarrhea   0.5-2.5 (3) MG/3ML SOLN Take by nebulization.    lansoprazole (PREVACID) 15 MG capsule Take 1 capsule (15 mg total) by mouth 2 (two) times daily.   lisinopril (ZESTRIL) 20 MG tablet Take  1 tablet   2 x /day     Magnesium 400 MG TABS Take 400 mg by mouth daily.   metFORMIN (GLUCOPHAGE-XR) 500 MG 24 hr tablet Take  2 tablets   2 x /day  with Meals  for Diabetes   methocarbamol (ROBAXIN-750) 750 MG tablet Take 1 tablet (750 mg total) by mouth every 8 (eight) hours as needed for muscle spasms.   ondansetron (ZOFRAN ODT) 4 MG disintegrating tablet Take 1 tablet (4 mg total) by mouth every 8 (eight) hours as needed for nausea or vomiting.   predniSONE (DELTASONE) 10 MG tablet 1 tab 3 x day for 2 days, then 1 tab 2 x day for 2 days, then 1 tab 1 x day for 3 days   propranolol (INDERAL) 80 MG tablet TAKE ONE TABLET THREE TIMES DAILY    rosuvastatin (CRESTOR) 20 MG tablet take 1 tablet by mouth daily for cholesterol   sucralfate (CARAFATE) 1 g tablet Take 1 tablet (1 g total) by mouth 4 (four) times daily.   VITAMI Take 10,000 Units by mouth daily.   zinc gluconate 50 MG tablet Take 50 mg by mouth daily.   No current facility-administered medications on file prior to visit.    Allergies  Allergen Reactions   Peanut-Containing Drug Products Anaphylaxis   Ceclor [Cefaclor] Hives and Swelling   Sulfa Antibiotics Hives     Problem list He has Essential hypertension; Depression, controlled; OSA (obstructive sleep apnea); Mixed hyperlipidemia; Medication management; Vitamin D deficiency; Genital warts; GERD; Obesity (BMI 30.0-34.9); Testosterone deficiency; and Insulin resistance on their problem list.   Observations/Objective:  There were no vitals taken for this visit.  HEENT - WNL. Neck - supple.  Chest - Clear equal BS. Cor - Nl HS. RRR w/o sig MGR. PP 1(+). No edema. MS- FROM w/o deformities.  Gait Nl. Neuro -  Nl w/o focal abnormalities.   Assessment and Plan:      Follow Up Instructions:        I discussed the assessment  and treatment plan with the patient. The patient was provided an opportunity to ask questions and all were answered. The patient agreed with the plan and demonstrated an understanding of the instructions.       The patient was advised to call back or seek an in-person evaluation if the symptoms worsen or if the condition fails to improve as anticipated.    Marinus Maw, MD

## 2023-09-29 ENCOUNTER — Encounter: Payer: Self-pay | Admitting: Internal Medicine

## 2023-09-29 ENCOUNTER — Ambulatory Visit (INDEPENDENT_AMBULATORY_CARE_PROVIDER_SITE_OTHER): Payer: 59 | Admitting: Gastroenterology

## 2023-09-29 ENCOUNTER — Encounter: Payer: Self-pay | Admitting: Gastroenterology

## 2023-09-29 ENCOUNTER — Ambulatory Visit (INDEPENDENT_AMBULATORY_CARE_PROVIDER_SITE_OTHER): Payer: 59 | Admitting: Internal Medicine

## 2023-09-29 VITALS — BP 124/78 | HR 83 | Temp 97.9°F | Resp 17 | Ht 75.5 in | Wt 290.2 lb

## 2023-09-29 VITALS — BP 150/78 | HR 80 | Ht 75.5 in | Wt 288.2 lb

## 2023-09-29 DIAGNOSIS — Z111 Encounter for screening for respiratory tuberculosis: Secondary | ICD-10-CM

## 2023-09-29 DIAGNOSIS — Z0000 Encounter for general adult medical examination without abnormal findings: Secondary | ICD-10-CM

## 2023-09-29 DIAGNOSIS — I10 Essential (primary) hypertension: Secondary | ICD-10-CM

## 2023-09-29 DIAGNOSIS — Z7189 Other specified counseling: Secondary | ICD-10-CM

## 2023-09-29 DIAGNOSIS — M545 Low back pain, unspecified: Secondary | ICD-10-CM

## 2023-09-29 DIAGNOSIS — K449 Diaphragmatic hernia without obstruction or gangrene: Secondary | ICD-10-CM

## 2023-09-29 DIAGNOSIS — R7309 Other abnormal glucose: Secondary | ICD-10-CM

## 2023-09-29 DIAGNOSIS — Z136 Encounter for screening for cardiovascular disorders: Secondary | ICD-10-CM

## 2023-09-29 DIAGNOSIS — G4733 Obstructive sleep apnea (adult) (pediatric): Secondary | ICD-10-CM

## 2023-09-29 DIAGNOSIS — K219 Gastro-esophageal reflux disease without esophagitis: Secondary | ICD-10-CM | POA: Diagnosis not present

## 2023-09-29 DIAGNOSIS — R5383 Other fatigue: Secondary | ICD-10-CM

## 2023-09-29 DIAGNOSIS — E349 Endocrine disorder, unspecified: Secondary | ICD-10-CM

## 2023-09-29 DIAGNOSIS — Z1211 Encounter for screening for malignant neoplasm of colon: Secondary | ICD-10-CM

## 2023-09-29 DIAGNOSIS — Z0001 Encounter for general adult medical examination with abnormal findings: Secondary | ICD-10-CM

## 2023-09-29 DIAGNOSIS — E559 Vitamin D deficiency, unspecified: Secondary | ICD-10-CM

## 2023-09-29 DIAGNOSIS — R6889 Other general symptoms and signs: Secondary | ICD-10-CM

## 2023-09-29 DIAGNOSIS — E88819 Insulin resistance, unspecified: Secondary | ICD-10-CM

## 2023-09-29 DIAGNOSIS — E782 Mixed hyperlipidemia: Secondary | ICD-10-CM

## 2023-09-29 DIAGNOSIS — Z8249 Family history of ischemic heart disease and other diseases of the circulatory system: Secondary | ICD-10-CM

## 2023-09-29 MED ORDER — DEXAMETHASONE 4 MG PO TABS
ORAL_TABLET | ORAL | 0 refills | Status: DC
Start: 2023-09-29 — End: 2023-10-16

## 2023-09-29 MED ORDER — GABAPENTIN 600 MG PO TABS
ORAL_TABLET | ORAL | 1 refills | Status: AC
Start: 2023-09-29 — End: ?

## 2023-09-29 NOTE — Progress Notes (Signed)
Annual  Screening/Preventative Visit  & Comprehensive Evaluation & Examination   Future Appointments  Date Time Provider Department  09/29/2023  8:40 AM Shellia Cleverly, DO [facility]  09/29/2023  3:00 PM Lucky Cowboy, MD [facility]  10/11/2024  2:00 PM Lucky Cowboy, MD [facility]         This very nice 37 y.o. single WM presents for a Screening /Preventative Visit & comprehensive evaluation and management of multiple medical co-morbidities.  Patient has been followed for HTN, HLD, Moderate Obesity, Insulin Resistance /PreDiabetes  and Vitamin D Deficiency.  Patient has OSA, but uses CPAP very infrequently due to mask intolerance.   Patient has GERD controlled on his meds.        HTN predates circa 2014. Patient's BP has been controlled and  today's BP is at goal -124/78 . Patient denies any cardiac symptoms as chest pain, palpitations, shortness of breath, dizziness or ankle swelling.        Patient's hyperlipidemia is controlled with diet and Rosuvastatin. Patient denies myalgias or other medication SE's. Last lipids were at goal except sl elevated Trig's:  Lab Results  Component Value Date   CHOL 114 06/10/2023   HDL 28 (L) 06/10/2023   LDLCALC 55 06/10/2023   TRIG 262 (H) 06/10/2023   CHOLHDL 4.1 06/10/2023             Patient has Morbid Obesity (BMI 35+) and hx/o Pre-Diabetes/Insulin Resistance (A1c 5.3% /elevated Insulin 152 /2015)  and  he was initiated on Metformin for Insulin resistance and patient denies reactive hypoglycemic symptoms, visual blurring, diabetic polys or paresthesias. Last A1c was normal & at goal:   Lab Results  Component Value Date   HGBA1C 5.1 06/10/2023         Patient has hx/o Low Testosterone "212" in Nov 2016 and "207" in Jan 2017  and has self d/c'd the Testosterone replacement .       Finally, patient has history of Vitamin D Deficiency ("23" /2015 and "24" /2016) & last vitamin D was at goal:  Lab Results  Component Value  Date   VD25OH 29 (L) 06/10/2023      Current Outpatient Medications  Medication Instructions   albuterol HFA inhaler 1 puff, Inhalation, 4 times daily   amLODipine 10 MG tablet Take  1/2 to 1 tablet  Daily     aspirin EC 81 mg, 2 times daily   busPIRone  10 MG tablet Take  1/2 to 1 tablet   3 x /day  as needed                                                                         cetirizine  10 mg, Oral, Daily   cloNIDine  0.2 MG tablet Take  1 tablet  3 x/day    fish oil-omega-3 1 g, 2 times daily   gabapentin100 MG capsule Take  1 capsule  3 x /day    hydrochlorothiazide  25 MG  Take  1 tablet  Daily    lansoprazole  15 mg, Oral, 2 times daily   lisinopril 20 MG tablet Take  1 tablet   2 x /day    Magnesium 400 mg, Oral, Daily  meloxicam  7.5 mg, Oral, 2 times daily   metFORMIN -XR 500 MG  Take   2 tablets   2 x /day  with Meals    methocarbamol  750) 750 mg, Oral, Every 8 hours PRN   propranolol  80 MG tablet TAKE 1  TABLET THREE TIMES DAILY    rosuvastatin 20 MG tablet take 1 tablet daily for cholesterol   sucralfate  1 g, Oral, 4 times daily   vitamin C 1,000 mg, Oral, Daily   VITAMIN D  10,000 Units, Oral, Daily   Zinc  50 mg , Oral, Daily      Allergies  Allergen Reactions   Peanut-Containing Drug Products Anaphylaxis   Ceclor [Cefaclor] Hives and Swelling   Sulfa Antibiotics Hives    Past Medical History:  Diagnosis Date   Anxiety    Depression    Hidradenitis suppurativa    Hyperlipidemia    Hypertension    Prediabetes     Health Maintenance  Topic Date Due   PNEUMOCOCCAL POLYSACCHARIDE VACCINE AGE 40-64 HIGH RISK  Never done   COVID-19 Vaccine (1) Never done   Pneumococcal Vaccine 35-43 Years old (1 - PCV) Never done   FOOT EXAM  Never done   OPHTHALMOLOGY EXAM  12/10/2017   INFLUENZA VACCINE  07/29/2021   HEMOGLOBIN A1C  11/09/2021   TETANUS/TDAP  10/10/2025   Hepatitis C Screening  Completed   HIV Screening  Completed   HPV VACCINES  Aged Out     Immunization History  Administered Date(s) Administered   Hepatitis A, Adult 02/16/2019, 01/24/2020   Hepatitis B, adult 02/16/2019   Hepb-cpg 01/24/2020   Influenza Inj Mdck Quad With Preservative 11/01/2019, 12/11/2020   Influenza Split 10/11/2015   Influenza,inj,Quad PF,6+ Mos 10/13/2014   PPD Test 10/11/2015, 04/19/2019, 05/16/2020   Tdap 10/11/2015     Past Surgical History:  Procedure Laterality Date   None       Family History  Problem Relation Age of Onset   Heart disease Maternal Grandmother        Died from bypass surgery   Heart disease Mother        Bicuspid aortic valve   Heart disease Sister        Bicuspid aortic valve   Colitis Sister    Diabetes Sister    Heart disease Father        Bicuspid aortic valve   Breast cancer Paternal Grandmother    Diabetes Paternal Grandmother    Esophageal cancer Paternal Grandfather    Colon cancer Paternal Aunt     Social History   Socioeconomic History   Marital status: Single  Occupational History   Occupation: Research scientist (life sciences): [employer]  Tobacco Use   Smoking status: Every Day    Packs/day: 0.60    Years: 10.00    Pack years: 6.00    Types: Cigarettes   Smokeless tobacco: Never  Vaping Use   Vaping Use: Never used  Substance and Sexual Activity   Alcohol use: Yes    Comment: social   Drug use: Yes    Types: Marijuana   Sexual activity: Not on file  Social History Narrative   Lives alone.      ROS Constitutional: Denies fever, chills, weight loss/gain, headaches, insomnia,  night sweats or change in appetite. Does c/o fatigue. Eyes: Denies redness, blurred vision, diplopia, discharge, itchy or watery eyes.  ENT: Denies discharge, congestion, post nasal drip, epistaxis, sore throat, earache,  hearing loss, dental pain, Tinnitus, Vertigo, Sinus pain or snoring.  Cardio: Denies chest pain, palpitations, irregular heartbeat, syncope, dyspnea, diaphoresis, orthopnea, PND, claudication or  edema Respiratory: denies cough, dyspnea, DOE, pleurisy, hoarseness, laryngitis or wheezing.  Gastrointestinal: Denies dysphagia, heartburn, reflux, water brash, pain, cramps, nausea, vomiting, bloating, diarrhea, constipation, hematemesis, melena, hematochezia, jaundice or hemorrhoids Genitourinary: Denies dysuria, frequency, urgency, nocturia, hesitancy, discharge, hematuria or flank pain Musculoskeletal: Denies arthralgia, myalgia, stiffness, Jt. Swelling, pain, limp or strain/sprain. Denies Falls. Skin: Denies puritis, rash, hives, warts, acne, eczema or change in skin lesion Neuro: No weakness, tremor, incoordination, spasms, paresthesia or pain Psychiatric: Denies confusion, memory loss or sensory loss. Denies Depression. Endocrine: Denies change in weight, skin, hair change, nocturia, and paresthesia, diabetic polys, visual blurring or hyper / hypo glycemic episodes.  Heme/Lymph: No excessive bleeding, bruising or enlarged lymph nodes.   Physical Exam  BP 124/78   Pulse 83   Temp 97.9 F (36.6 C)   Resp 17   Ht 6' 3.5" (1.918 m)   Wt 290 lb 3.2 oz (131.6 kg)   SpO2 96%   BMI 35.79 kg/m   General Appearance: Over  nourished and well groomed and in no apparent distress.  Eyes: PERRLA, EOMs, conjunctiva no swelling or erythema, normal fundi and vessels. Sinuses: No frontal/maxillary tenderness ENT/Mouth: EACs patent / TMs  nl. Nares clear without erythema, swelling, mucoid exudates. Oral hygiene is good. No erythema, swelling, or exudate. Tongue normal, non-obstructing. Tonsils not swollen or erythematous. Hearing normal.  Neck: Supple, thyroid not palpable. No bruits, nodes or JVD. Respiratory: Respiratory effort normal.  BS equal and clear bilateral without rales, rhonci, wheezing or stridor. Cardio: Heart sounds are normal with regular rate and rhythm and no murmurs, rubs or gallops. Peripheral pulses are normal and equal bilaterally without edema. No aortic or femoral  bruits. Chest: symmetric with normal excursions and percussion.  Abdomen: Soft, with Nl bowel sounds. Nontender, no guarding, rebound, hernias, masses, or organomegaly.  Lymphatics: Non tender without lymphadenopathy.  Musculoskeletal: Full ROM all peripheral extremities, joint stability, 5/5 strength, and normal gait. Skin: Warm and dry without rashes, lesions, cyanosis, clubbing or  ecchymosis.  Neuro: Cranial nerves intact, reflexes equal bilaterally. Normal muscle tone, no cerebellar symptoms. Sensation intact.  Pysch: Alert and oriented X 3 with normal affect, insight and judgment appropriate.   Assessment and Plan  1. Annual Preventative/Screening Exam    2. Essential hypertension  - EKG 12-Lead - Urinalysis, Routine w reflex microscopic - Microalbumin / creatinine urine ratio - CBC with Differential/Platelet - COMPLETE METABOLIC PANEL WITH GFR - Magnesium - TSH  3. Hyperlipidemia, mixed  - EKG 12-Lead - Lipid panel - TSH  4. Insulin resistance  - EKG 12-Lead - Hemoglobin A1c - Insulin, random  5. Vitamin D deficiency  - VITAMIN D 25 Hydroxy   6. Prediabetes  - EKG 12-Lead - Hemoglobin A1c - Insulin, random  7. Testosterone deficiency  - Testosterone  8. OSA (obstructive sleep apnea)   9. Screening-pulmonary TB  - TB Skin Test  10. Screening for colorectal cancer  - POC Hemoccult Bld/Stl   11. Screening for ischemic heart disease  - EKG 12-Lead  12. Family history of ischemic heart disease  - EKG 12-Lead  13. Fatigue  - Iron, Total/Total Iron Binding Cap - Vitamin B12 - Testosterone - CBC with Differential/Platelet - TSH  14. Medication management  - CBC with Differential/Platelet - COMPLETE METABOLIC PANEL WITH GFR - Magnesium - Lipid panel - TSH -  Hemoglobin A1c - VITAMIN D 25 Hydroxy         Patient was counseled in prudent diet, weight control to achieve/maintain BMI less than 25, BP monitoring, regular exercise and  medications as discussed.  Discussed med effects and SE's. Routine screening labs and tests as requested with regular follow-up as recommended. Over 40 minutes of exam, counseling, chart review and high complex critical decision making was performed   Spencer Maw, MD

## 2023-09-29 NOTE — Progress Notes (Signed)
Chief Complaint: GERD, discuss transoral Incisionless Fundoplication (TIF)   Referring Provider:     Willaim Rayas. Armbruster, MD    HPI:     Spencer Love is a 37 y.o. male with a history of HTN, HLD, obesity (BMI 35.5), prediabetes, vitamin D deficiency, OSA, GERD, tobacco use, referred to me by Dr. Adela Lank for evaluation of possible antireflux intervention with Transoral Incisionless Fundoplication (TIF) with a goal to stop or significantly reduce acid suppression therapy.  Longstanding history of GERD for many years.  Has trialed numerous medications in the past to include Pepcid, Nexium, omeprazole, and most recently Protonix dose at 40 mg twice daily.   Currently, heartburn well-controlled on Protonix 40 mg bid.  Reports overall 80% better with the Protonix.   Has been evaluated by ENT, to include previous normal laryngoscopy.  Does have a history of seasonal allergies and occasional postnasal drip, and previously prescribed Zyrtec.  Has started taking the Zyrtec regularly over last few months with improvement in throat clearing, mucus production, post nasal drip.   GERD history: -Index symptoms: Heartburn, regurgitation, globus sensation, increased throat clearing.  No dysphagia -Exacerbating features: -Medications trialed: Nexium, omeprazole, pantoprazole, Pepcid, Carafate -Current medications: Protonix 40 mg bid -Complications: Hiatal hernia  GERD evaluation: -Last EGD: 03/2023.  1 cm HH, Hill grade 2 valve, histologic e/o reflux esophagitis -Barium esophagram: 04/2023: Normal.  Normal motility, no HH, no spontaneous reflux -Esophageal Manometry: None -pH/Impedance: None -Bravo: None  Endoscopic History: - 06/03/2018: Colonoscopy: Normal, normal TI.  Internal hemorrhoids.  Benign random biopsies - 04/28/2023: EGD: 1 cm hiatal hernia with Hill grade 2 valve.  Subtle mild furrowing in the lower esophagus (path: Reflux changes without EOE), Gastric inlet patch,  otherwise normal stomach and duodenum   GERD-HRQL Questionnaire Score:   Reviewed most recent labs from 05/2023: Normal CBC, CMP, A1c (5.1%), TSH.  Reviewed images from upper endoscopy in 03/2023 and appears to have small sliding type hiatal hernia with mild LES laxity and Hill grade 2 valve.  Reviewed images from barium esophagram as above.   Past Medical History:  Diagnosis Date   Anxiety    Depression    GERD (gastroesophageal reflux disease)    Hidradenitis suppurativa    Hyperlipidemia    Hypertension    Prediabetes      Past Surgical History:  Procedure Laterality Date   None     Family History  Problem Relation Age of Onset   Heart disease Maternal Grandmother        Died from bypass surgery   Heart disease Mother        Bicuspid aortic valve   Heart disease Sister        Bicuspid aortic valve   Colitis Sister    Diabetes Sister    Heart disease Father        Bicuspid aortic valve   Breast cancer Paternal Grandmother    Diabetes Paternal Grandmother    Esophageal cancer Paternal Grandfather    Colon cancer Paternal Aunt    Social History   Tobacco Use   Smoking status: Every Day    Current packs/day: 0.60    Average packs/day: 0.6 packs/day for 10.0 years (6.0 ttl pk-yrs)    Types: Cigarettes   Smokeless tobacco: Never  Vaping Use   Vaping status: Never Used  Substance Use Topics   Alcohol use: Yes    Comment: 3-4 beers a day  Drug use: Not Currently   Current Outpatient Medications  Medication Sig Dispense Refill   albuterol (VENTOLIN HFA) 108 (90 Base) MCG/ACT inhaler Inhale 1 puff into the lungs 4 (four) times daily.     amLODipine (NORVASC) 10 MG tablet Take  1/2 to 1 tablet  Daily  for BP                                              /                                          TAKE                                        BY                                  MOUTH 90 tablet 3   Ascorbic Acid (VITAMIN C) 1000 MG tablet Take 1,000 mg by mouth daily.      aspirin EC 81 MG tablet Take 81 mg by mouth 2 (two) times daily.     busPIRone (BUSPAR) 10 MG tablet Take  1/2 to 1 tablet   3 x /day  as needed  for Chronic Anxiety                                                                     /  TAKE                                                            BY                                               MOUTH 270 tablet 3   cetirizine (KLS ALLER-TEC) 10 MG tablet Take 1 tablet (10 mg total) by mouth daily. 90 tablet 1   cloNIDine (CATAPRES) 0.2 MG tablet Take  1 tablet  3 x/day for BP                                                                               /                        TAKE                                   BY                                 MOUTH 270 tablet 3   fish oil-omega-3 fatty acids 1000 MG capsule Take 1 g by mouth 2 (two) times daily.     Flaxseed, Linseed, (FLAXSEED OIL PO) as needed.     gabapentin (NEURONTIN) 100 MG capsule Take  1 capsule  3 x /day for Chronic Pain                                                                 /                                                         TAKE                                              BY                                 MOUTH 270 capsule 3   hydrochlorothiazide (HYDRODIURIL) 25 MG tablet Take  1 tablet  Daily for BP & Fluid Retention / Ankle Swelling                                          /  take                                                            by                                  mouth 90 tablet 3   ipratropium-albuterol (DUONEB) 0.5-2.5 (3) MG/3ML SOLN Take by nebulization.     lisinopril (ZESTRIL) 20 MG tablet Take  1 tablet   2 x /day  for BP                                                                         /                                         take                                   by                             mouth 180 tablet 3   Magnesium 400 MG TABS Take 400 mg by mouth daily.     meloxicam (MOBIC) 7.5 MG tablet Take 1 tablet (7.5 mg total) by mouth 2 (two) times daily. 60 tablet 1   metFORMIN (GLUCOPHAGE-XR) 500 MG 24 hr tablet Take   2 tablets   2 x /day  with Meals  for Diabetes 360 tablet 3   methocarbamol (ROBAXIN-750) 750 MG tablet Take 1 tablet (750 mg total) by mouth every 8 (eight) hours as needed for muscle spasms. 60 tablet 1   pantoprazole (PROTONIX) 40 MG tablet Take 40 mg by mouth 2 (two) times daily before a meal.     propranolol (INDERAL) 80 MG tablet TAKE ONE TABLET BY MOUTH THREE TIMES DAILY FOR BLOOD PRESSURE Strength: 80 mg 270 tablet 0   rosuvastatin (CRESTOR) 20 MG tablet take 1 tablet by mouth daily for cholesterol 90 tablet 0   VITAMIN D PO Take 10,000 Units by mouth daily.     zinc gluconate 50 MG tablet Take 50 mg by mouth daily.     hyoscyamine (LEVSIN SL) 0.125 MG SL tablet Dissolve  1 to 2 tablets under tongue3 to 4 x day if needed for Nausea, Vomiting, Cramping, Bloating  or Diarrhea (Patient not taking: Reported on 09/29/2023) 90 tablet 0   ondansetron (ZOFRAN ODT) 4 MG disintegrating tablet Take 1 tablet (4 mg total) by mouth every 8 (eight) hours as needed for nausea or vomiting. (Patient not taking: Reported on 09/29/2023) 10 tablet 0   sucralfate (CARAFATE) 1 g tablet Take 1 tablet (1 g total) by mouth 4 (four) times daily. (Patient not taking: Reported on 09/29/2023) 120 tablet 1   No  current facility-administered  medications for this visit.   Allergies  Allergen Reactions   Peanut-Containing Drug Products Anaphylaxis   Ceclor [Cefaclor] Hives and Swelling   Sulfa Antibiotics Hives     Review of Systems: All systems reviewed and negative except where noted in HPI.     Physical Exam:    Wt Readings from Last 3 Encounters:  09/29/23 288 lb 4 oz (130.7 kg)  08/06/23 293 lb (132.9 kg)  06/10/23 295 lb 9.6 oz (134.1 kg)    BP (!) 150/78 (BP Location: Left Arm, Patient Position: Sitting, Cuff Size: Normal)   Pulse 80   Ht 6' 3.5" (1.918 m) Comment: height measured without shoes  Wt 288 lb 4 oz (130.7 kg)   BMI 35.55 kg/m  Constitutional:  Pleasant, in no acute distress. Psychiatric: Normal mood and affect. Behavior is normal. Neurological: Alert and oriented to person place and time. Skin: Skin is warm and dry. No rashes noted.   ASSESSMENT AND PLAN;   1) GERD 2) Hiatal hernia Very pleasant 36 year old male with longstanding history of GERD.  Has trialed numerous acid suppression medications over the years, and currently symptoms largely well-controlled with Protonix 40 mg bid.  We discussed the pathophysiology of reflux at length today, to include management options of continued medications vs antireflux surgery, specifically TIF.  Ultimately, he would like to continue with medical management for the time being.  - Continue Protonix as prescribed - Continue antireflux lifestyle/dietary modifications - If planning on TIF in the future, would plan for either EM with pH/Mii or repeat EGD with Bravo placement off PPI therapy - Can resume regular follow-up with Dr. Adela Lank and follow-up with me on an as-needed basis  3) Medication counseling I have reviewed the indications, risks, and benefits of PPI therapy with the patient today, and as above he would like to continue with PPI therapy.  4) Postnasal drip - Resume Zyrtec   Shellia Cleverly, DO, FACG  09/29/2023, 9:00  AM   Lucky Cowboy, MD

## 2023-09-29 NOTE — Patient Instructions (Signed)
Due to recent changes in healthcare laws, you may see the results of your imaging and laboratory studies on MyChart before your provider has had a chance to review them.  We understand that in some cases there may be results that are confusing or concerning to you. Not all laboratory results come back in the same time frame and the provider may be waiting for multiple results in order to interpret others.  Please give us 48 hours in order for your provider to thoroughly review all the results before contacting the office for clarification of your results.   +++++++++++++++++++++++++++++++++  Vit D  & Vit C 1,000 mg   are recommended to help protect  against the Covid-19 and other Corona viruses.    Also it's recommended  to take  Zinc 50 mg  to help  protect against the Covid-19   and best place to get  is also on [employer]  and don't pay more than 6-8 cents /pill !  ================================= Coronavirus (COVID-19) Are you at risk?  Are you at risk for the Coronavirus (COVID-19)?  To be considered HIGH RISK for Coronavirus (COVID-19), you have to meet the following criteria:  Traveled to [place], [place], [place], [place] or [place]; or in the [place] to [place], [place], [place]  or [place]; and have fever, cough, and shortness of breath within the last 2 weeks of travel OR Been in close contact with a person diagnosed with COVID-19 within the last 2 weeks and have  fever, cough,and shortness of breath  IF YOU DO NOT MEET THESE CRITERIA, YOU ARE CONSIDERED LOW RISK FOR COVID-19.  What to do if you are HIGH RISK for COVID-19?  If you are having a medical emergency, call 911. Seek medical care right away. Before you go to a doctor's office, urgent care or emergency department,  call ahead and tell them about your recent travel, contact with someone diagnosed with COVID-19   and your symptoms.  You should receive instructions from your physician's office  regarding next steps of care.  When you arrive at healthcare provider, tell the healthcare staff immediately you have returned from  visiting [place], [place], [place], [place] or [place]; or traveled in the [place] to [place], [place],  [place] or [place] in the last two weeks or you have been in close contact with a person diagnosed with  COVID-19 in the last 2 weeks.   Tell the health care staff about your symptoms: fever, cough and shortness of breath. After you have been seen by a medical provider, you will be either: Tested for (COVID-19) and discharged home on quarantine except to seek medical care if  symptoms worsen, and asked to  Stay home and avoid contact with others until you get your results (4-5 days)  Avoid travel on public transportation if possible (such as bus, train, or airplane) or Sent to the Emergency Department by EMS for evaluation, COVID-19 testing  and  possible admission depending on your condition and test results.  What to do if you are LOW RISK for COVID-19?  Reduce your risk of any infection by using the same precautions used for avoiding the common cold or flu:  Wash your hands often with soap and warm water for at least 20 seconds.  If soap and water are not readily available,  use an alcohol-based hand sanitizer with at least 60% alcohol.  If coughing or sneezing, cover your mouth and nose by coughing   or sneezing into the elbow areas of your shirt or coat,  into a tissue or into your sleeve (not your hands). Avoid shaking hands with others and consider head nods or verbal greetings only. Avoid touching your eyes, nose, or mouth with unwashed hands.  Avoid close contact with people who are sick. Avoid places or events with large numbers of people in one location, like concerts or sporting events. Carefully consider travel plans you have or are making. If you are planning any travel outside or inside the [place], visit the CDC's Travelers' Health  webpage for the latest health notices. If you have some symptoms but not all symptoms, continue to monitor at home and seek medical attention  if your symptoms worsen. If you are having a medical emergency, call 911. >>>>>>>>>>>>>>>>>>>>>>>> Preventive Care for Adults  A healthy lifestyle and preventive care can promote health and wellness. Preventive health guidelines for men include the following key practices: A routine yearly physical is a good way to check with your health care provider about your health and preventative screening. It is a chance to share any concerns and updates on your health and to receive a thorough exam. Visit your dentist for a routine exam and preventative care every 6 months. Brush your teeth twice a day and floss once a day. Good oral hygiene prevents tooth decay and gum disease. The frequency of eye exams is based on your age, health, family medical history, use of contact lenses, and other factors. Follow your health care provider's recommendations for frequency of eye exams. Eat a healthy diet. Foods such as vegetables, fruits, whole grains, low-fat dairy products, and lean protein foods contain the nutrients you need without too many calories. Decrease your intake of foods high in solid fats, added sugars, and salt. Eat the right amount of calories for you. Get information about a proper diet from your health care provider, if necessary. Regular physical exercise is one of the most important things you can do for your health. Most adults should get at least 150 minutes of moderate-intensity exercise (any activity that increases your heart rate and causes you to sweat) each week. In addition, most adults need muscle-strengthening exercises on 2 or more days a week. Maintain a healthy weight. The body mass index (BMI) is a screening tool to identify possible weight problems. It provides an estimate of body fat based on height and weight. Your health care provider can  find your BMI and can help you achieve or maintain a healthy weight. For adults 20 years and older: A BMI below 18.5 is considered underweight. A BMI of 18.5 to 24.9 is normal. A BMI of 25 to 29.9 is considered overweight. A BMI of 30 and above is considered obese. Maintain normal blood lipids and cholesterol levels by exercising and minimizing your intake of saturated fat. Eat a balanced diet with plenty of fruit and vegetables. Blood tests for lipids and cholesterol should begin at age 20 and be repeated every 5 years. If your lipid or cholesterol levels are high, you are over 50, or you are at high risk for heart disease, you may need your cholesterol levels checked more frequently. Ongoing high lipid and cholesterol levels should be treated with medicines if diet and exercise are not working. If you smoke, find out from your health care provider how to quit. If you do not use tobacco, do not start. Lung cancer screening is recommended for adults aged 55-80 years who are at high risk for   developing lung cancer because of a history of smoking. A yearly low-dose CT scan of the lungs is recommended for people who have at least a 30-pack-year history of smoking and are a current smoker or have quit within the past 15 years. A pack year of smoking is smoking an average of 1 pack of cigarettes a day for 1 year (for example: 1 pack a day for 30 years or 2 packs a day for 15 years). Yearly screening should continue until the smoker has stopped smoking for at least 15 years. Yearly screening should be stopped for people who develop a health problem that would prevent them from having lung cancer treatment. If you choose to drink alcohol, do not have more than 2 drinks per day. One drink is considered to be 12 ounces (355 mL) of beer, 5 ounces (148 mL) of wine, or 1.5 ounces (44 mL) of liquor. High blood pressure causes heart disease and increases the risk of stroke. Your blood pressure should be checked. Ongoing  high blood pressure should be treated with medicines, if weight loss and exercise are not effective. If you are 50-61 years old, ask your health care provider if you should take aspirin to prevent heart disease. Diabetes screening involves taking a blood sample to check your fasting blood sugar level. Testing should be considered at a younger age or be carried out more frequently if you are overweight and have at least 1 risk factor for diabetes. Colorectal cancer can be detected and often prevented. Most routine colorectal cancer screening begins at the age of 32 and continues through age 64. However, your health care provider may recommend screening at an earlier age if you have risk factors for colon cancer. On a yearly basis, your health care provider may provide home test kits to check for hidden blood in the stool. Use of a small camera at the end of a tube to directly examine the colon (sigmoidoscopy or colonoscopy) can detect the earliest forms of colorectal cancer. Talk to your health care provider about this at age 48, when routine screening begins. Direct exam of the colon should be repeated every 5-10 years through age 56, unless early forms of precancerous polyps or small growths are found. Screening for abdominal aortic aneurysm (AAA)  are recommended for persons over age 52 who have history of hypertensionor who are current or former smokers. Talk with your health care provider about prostate cancer screening. Testicular cancer screening is recommended for adult males. Screening includes self-exam, a health care provider exam, and other screening tests. Consult with your health care provider about any symptoms you have or any concerns you have about testicular cancer. Use sunscreen. Apply sunscreen liberally and repeatedly throughout the day. You should seek shade when your shadow is shorter than you. Protect yourself by wearing long sleeves, pants, a wide-brimmed hat, and sunglasses year  round, whenever you are outdoors. Once a month, do a whole-body skin exam, using a mirror to look at the skin on your back. Tell your health care provider about new moles, moles that have irregular borders, moles that are larger than a pencil eraser, or moles that have changed in shape or color. Stay current with required vaccines (immunizations). Influenza vaccine. All adults should be immunized every year. Tetanus, diphtheria, and acellular pertussis (Td, Tdap) vaccine. An adult who has not previously received Tdap or who does not know his vaccine status should receive 1 dose of Tdap. This initial dose should be followed by tetanus  and diphtheria toxoids (Td) booster doses every 10 years. Adults with an unknown or incomplete history of completing a 3-dose immunization series with Td-containing vaccines should begin or complete a primary immunization series including a Tdap dose. Adults should receive a Td booster every 10 years. Zoster vaccine. One dose is recommended for adults aged 84 years or older unless certain conditions are present.  Pneumococcal 13-valent conjugate (PCV13) vaccine. When indicated, a person who is uncertain of his immunization history and has no record of immunization should receive the PCV13 vaccine. An adult aged 18 years or older who has certain medical conditions and has not been previously immunized should receive 1 dose of PCV13 vaccine. This PCV13 should be followed with a dose of pneumococcal polysaccharide (PPSV23) vaccine. The PPSV23 vaccine dose should be obtained at least 8 weeks after the dose of PCV13 vaccine. An adult aged 36 years or older who has certain medical conditions and previously received 1 or more doses of PPSV23 vaccine should receive 1 dose of PCV13. The PCV13 vaccine dose should be obtained 1 or more years after the last PPSV23 vaccine dose.  Pneumococcal polysaccharide (PPSV23) vaccine. When PCV13 is also indicated, PCV13 should be obtained first. All  adults aged 40 years and older should be immunized. An adult younger than age 15 years who has certain medical conditions should be immunized. Any person who resides in a nursing home or long-term care facility should be immunized. An adult smoker should be immunized. People with an immunocompromised condition and certain other conditions should receive both PCV13 and PPSV23 vaccines. People with human immunodeficiency virus (HIV) infection should be immunized as soon as possible after diagnosis. Immunization during chemotherapy or radiation therapy should be avoided. Routine use of PPSV23 vaccine is not recommended for American Indians, [place], or people younger than 65 years unless there are medical conditions that require PPSV23 vaccine. When indicated, people who have unknown immunization and have no record of immunization should receive PPSV23 vaccine. One-time revaccination 5 years after the first dose of PPSV23 is recommended for people aged 19-64 years who have chronic kidney failure, nephrotic syndrome, asplenia, or immunocompromised conditions. People who received 1-2 doses of PPSV23 before age 30 years should receive another dose of PPSV23 vaccine at age 60 years or later if at least 5 years have passed since the previous dose. Doses of PPSV23 are not needed for people immunized with PPSV23 at or after age 48 years. Hepatitis A vaccine. Adults who wish to be protected from this disease, have certain high-risk conditions, work with hepatitis A-infected animals, work in hepatitis A research labs, or travel to or work in countries with a high rate of hepatitis A should be immunized. Adults who were previously unvaccinated and who anticipate close contact with an international adoptee during the first 60 days after arrival in the [place] from a country with a high rate of hepatitis A should be immunized. Hepatitis B vaccine. Adults should be immunized if they wish to be protected from this  disease, have certain high-risk conditions, may be exposed to blood or other infectious body fluids, are household contacts or sex partners of hepatitis B positive people, are clients or workers in certain care facilities, or travel to or work in countries with a high rate of hepatitis B.  Preventive Service / Frequency  Ages 6 to 1 Blood pressure check. Lipid and cholesterol check. Hepatitis C blood test.** / For any individual with known risks for hepatitis C. Skin self-exam. /  Monthly. Influenza vaccine. / Every year. Tetanus, diphtheria, and acellular pertussis (Tdap, Td) vaccine.** / Consult your health care provider. 1 dose of Td every 10 years. HPV vaccine. / 3 doses over 6 months, if 26 or younger. Measles, mumps, rubella (MMR) vaccine.** / You need at least 1 dose of MMR if you were born in 1957 or later. You may also need a second dose. Pneumococcal 13-valent conjugate (PCV13) vaccine.** / Consult your health care provider. Pneumococcal polysaccharide (PPSV23) vaccine.** / 1 to 2 doses if you smoke cigarettes or if you have certain conditions. Meningococcal vaccine.** / 1 dose if you are age 19 to 21 years and a first-year college student living in a residence hall, or have one of several medical conditions. You may also need additional booster doses. Hepatitis A vaccine.** / Consult your health care provider. Hepatitis B vaccine.** / Consult your health care provider. +++++++++ Recommend Adult Low Dose Aspirin or  coated  Aspirin 81 mg daily  To reduce risk of Colon Cancer 40 %,  Skin Cancer 26 % ,  Melanoma 46%  and  Pancreatic cancer 60% ++++++++++++++++++ Vitamin D goal  is between 70-100.  Please make sure that you are taking your Vitamin D as directed.  It is very important as a natural anti-inflammatory  helping hair, skin, and nails, as well as reducing stroke and heart attack risk.  It helps your bones and helps with mood. It also decreases numerous cancer risks  so please take it as directed.  Low Vit D is associated with a 200-300% higher risk for CANCER  and 200-300% higher risk for HEART   ATTACK  &  STROKE.   ...................................... It is also associated with higher death rate at younger ages,  autoimmune diseases like Rheumatoid arthritis, Lupus, Multiple Sclerosis.    Also many other serious conditions, like depression, Alzheimer's Dementia, infertility, muscle aches, fatigue, fibromyalgia - just to name a few. +++++++++++++++++++++ Recommend the book "The END of DIETING" by Dr Joel Fuhrman  & the book "The END of DIABETES " by Dr Joel Fuhrman At [employer] - get book & Audio CD's    Being diabetic has a  300% increased risk for heart attack, stroke, cancer, and alzheimer- type vascular dementia. It is very important that you work harder with diet by avoiding all foods that are white. Avoid white rice (brown & wild rice is OK), white potatoes (sweetpotatoes in moderation is OK), White bread or wheat bread or anything made out of white flour like bagels, donuts, rolls, buns, biscuits, cakes, pastries, cookies, pizza crust, and pasta (made from white flour & egg whites) - vegetarian pasta or spinach or wheat pasta is OK. Multigrain breads like Arnold's or [employer], or multigrain sandwich thins or flatbreads.  Diet, exercise and weight loss can reverse and cure diabetes in the early stages.  Diet, exercise and weight loss is very important in the control and prevention of complications of diabetes which affects every system in your body, ie. Brain - dementia/stroke, eyes - glaucoma/blindness, heart - heart attack/heart failure, kidneys - dialysis, stomach - gastric paralysis, intestines - malabsorption, nerves - severe painful neuritis, circulation - gangrene & loss of a leg(s), and finally cancer and Alzheimers.    I recommend avoid fried & greasy foods,  sweets/candy, white rice (brown or wild rice or Quinoa is OK), white potatoes  (sweet potatoes are OK) - anything made from white flour - bagels, doughnuts, rolls, buns, biscuits,white and wheat breads, pizza crust and   traditional pasta made of white flour & egg white(vegetarian pasta or spinach or wheat pasta is OK).  Multi-grain bread is OK - like multi-grain flat bread or sandwich thins. Avoid alcohol in excess. Exercise is also important.    Eat all the vegetables you want - avoid meat, especially red meat and dairy - especially cheese.  Cheese is the most concentrated form of trans-fats which is the worst thing to clog up our arteries. Veggie cheese is OK which can be found in the fresh produce section at [employer] or [employer] or [employer]  +++++++++++++++++++ DASH Eating Plan  DASH stands for "Dietary Approaches to Stop Hypertension."   The DASH eating plan is a healthy eating plan that has been shown to reduce high blood pressure (hypertension). Additional health benefits may include reducing the risk of type 2 diabetes mellitus, heart disease, and stroke. The DASH eating plan may also help with weight loss. WHAT DO I NEED TO KNOW ABOUT THE DASH EATING PLAN? For the DASH eating plan, you will follow these general guidelines: Choose foods with a percent daily value for sodium of less than 5% (as listed on the food label). Use salt-free seasonings or herbs instead of table salt or sea salt. Check with your health care provider or pharmacist before using salt substitutes. Eat lower-sodium products, often labeled as "lower sodium" or "no salt added." Eat fresh foods. Eat more vegetables, fruits, and low-fat dairy products. Choose whole grains. Look for the word "whole" as the first word in the ingredient list. Choose fish  Limit sweets, desserts, sugars, and sugary drinks. Choose heart-healthy fats. Eat veggie cheese  Eat more home-cooked food and less restaurant, buffet, and fast food. Limit fried foods. Cook foods using methods other than frying. Limit  canned vegetables. If you do use them, rinse them well to decrease the sodium. When eating at a restaurant, ask that your food be prepared with less salt, or no salt if possible.                      WHAT FOODS CAN I EAT? Read Dr Fara Olden Fuhrman's books on The End of Dieting & The End of Diabetes  Grains Whole grain or whole wheat bread. Brown rice. Whole grain or whole wheat pasta. Quinoa, bulgur, and whole grain cereals. Low-sodium cereals. Corn or whole wheat flour tortillas. Whole grain cornbread. Whole grain crackers. Low-sodium crackers.  Vegetables Fresh or frozen vegetables (raw, steamed, roasted, or grilled). Low-sodium or reduced-sodium tomato and vegetable juices. Low-sodium or reduced-sodium tomato sauce and paste. Low-sodium or reduced-sodium canned vegetables.   Fruits All fresh, canned (in natural juice), or frozen fruits.  Protein Products  All fish and seafood.  Dried beans, peas, or lentils. Unsalted nuts and seeds. Unsalted canned beans.  Dairy Low-fat dairy products, such as skim or 1% milk, 2% or reduced-fat cheeses, low-fat ricotta or cottage cheese, or plain low-fat yogurt. Low-sodium or reduced-sodium cheeses.  Fats and Oils Tub margarines without trans fats. Light or reduced-fat mayonnaise and salad dressings (reduced sodium). Avocado. Safflower, olive, or canola oils. Natural peanut or almond butter.  Other Unsalted popcorn and pretzels. The items listed above may not be a complete list of recommended foods or beverages. Contact your dietitian for more options.  +++++++++++++++++++  WHAT FOODS ARE NOT RECOMMENDED? Grains/ White flour or wheat flour White bread. White pasta. White rice. Refined cornbread. Bagels and croissants. Crackers that contain trans fat.  Vegetables  Creamed or fried vegetables. Vegetables  in a . Regular canned vegetables. Regular canned tomato sauce and paste. Regular tomato and vegetable juices.  Fruits Dried fruits. Canned fruit  in light or heavy syrup. Fruit juice.  Meat and Other Protein Products Meat in general - RED meat & White meat.  Fatty cuts of meat. Ribs, chicken wings, all processed meats as bacon, sausage, bologna, salami, fatback, hot dogs, bratwurst and packaged luncheon meats.  Dairy Whole or 2% milk, cream, half-and-half, and cream cheese. Whole-fat or sweetened yogurt. Full-fat cheeses or blue cheese. Non-dairy creamers and whipped toppings. Processed cheese, cheese spreads, or cheese curds.  Condiments Onion and garlic salt, seasoned salt, table salt, and sea salt. Canned and packaged gravies. Worcestershire sauce. Tartar sauce. Barbecue sauce. Teriyaki sauce. Soy sauce, including reduced sodium. Steak sauce. Fish sauce. Oyster sauce. Cocktail sauce. Horseradish. Ketchup and mustard. Meat flavorings and tenderizers. Bouillon cubes. Hot sauce. Tabasco sauce. Marinades. Taco seasonings. Relishes.  Fats and Oils Butter, stick margarine, lard, shortening and bacon fat. Coconut, palm kernel, or palm oils. Regular salad dressings.  Pickles and olives. Salted popcorn and pretzels.  The items listed above may not be a complete list of foods and beverages to avoid.   

## 2023-09-29 NOTE — Patient Instructions (Signed)
_______________________________________________________  If your blood pressure at your visit was 140/90 or greater, please contact your primary care physician to follow up on this.  _______________________________________________________  If you are age 36 or older, your body mass index should be between 23-30. Your Body mass index is 35.55 kg/m. If this is out of the aforementioned range listed, please consider follow up with your Primary Care Provider.  If you are age 18 or younger, your body mass index should be between 19-25. Your Body mass index is 35.55 kg/m. If this is out of the aformentioned range listed, please consider follow up with your Primary Care Provider.   ________________________________________________________  The [facility] providers would like to encourage you to use [facility] to communicate with providers for non-urgent requests or questions.  Due to long hold times on the telephone, sending your provider a message by [facility] may be a faster and more efficient way to get a response.  Please allow 48 business hours for a response.  Please remember that this is for non-urgent requests.  _______________________________________________________  Follow up as needed  It was a pleasure to see you today!  Vito Cirigliano, D.O.

## 2023-09-30 LAB — CBC WITH DIFFERENTIAL/PLATELET
Absolute Monocytes: 376 {cells}/uL (ref 200–950)
Basophils Absolute: 56 {cells}/uL (ref 0–200)
Basophils Relative: 0.6 %
Eosinophils Absolute: 179 {cells}/uL (ref 15–500)
Eosinophils Relative: 1.9 %
HCT: 45.8 % (ref 38.5–50.0)
Hemoglobin: 15.7 g/dL (ref 13.2–17.1)
Lymphs Abs: 2576 {cells}/uL (ref 850–3900)
MCH: 30.2 pg (ref 27.0–33.0)
MCHC: 34.3 g/dL (ref 32.0–36.0)
MCV: 88.1 fL (ref 80.0–100.0)
MPV: 11.3 fL (ref 7.5–12.5)
Monocytes Relative: 4 %
Neutro Abs: 6213 {cells}/uL (ref 1500–7800)
Neutrophils Relative %: 66.1 %
Platelets: 212 10*3/uL (ref 140–400)
RBC: 5.2 10*6/uL (ref 4.20–5.80)
RDW: 13.2 % (ref 11.0–15.0)
Total Lymphocyte: 27.4 %
WBC: 9.4 10*3/uL (ref 3.8–10.8)

## 2023-09-30 LAB — COMPLETE METABOLIC PANEL WITH GFR
AG Ratio: 2.5 (calc) (ref 1.0–2.5)
ALT: 30 U/L (ref 9–46)
AST: 13 U/L (ref 10–40)
Albumin: 4.8 g/dL (ref 3.6–5.1)
Alkaline phosphatase (APISO): 46 U/L (ref 36–130)
BUN: 20 mg/dL (ref 7–25)
CO2: 29 mmol/L (ref 20–32)
Calcium: 9.9 mg/dL (ref 8.6–10.3)
Chloride: 101 mmol/L (ref 98–110)
Creat: 0.92 mg/dL (ref 0.60–1.26)
Globulin: 1.9 g/dL (ref 1.9–3.7)
Glucose, Bld: 107 mg/dL — ABNORMAL HIGH (ref 65–99)
Potassium: 4.1 mmol/L (ref 3.5–5.3)
Sodium: 140 mmol/L (ref 135–146)
Total Bilirubin: 0.4 mg/dL (ref 0.2–1.2)
Total Protein: 6.7 g/dL (ref 6.1–8.1)
eGFR: 111 mL/min/{1.73_m2} (ref >=60)

## 2023-09-30 LAB — LIPID PANEL
Cholesterol: 189 mg/dL (ref <200)
HDL: 36 mg/dL — ABNORMAL LOW (ref >=40)
LDL Cholesterol (Calc): 116 mg/dL — ABNORMAL HIGH
Non-HDL Cholesterol (Calc): 153 mg/dL — ABNORMAL HIGH (ref <130)
Total CHOL/HDL Ratio: 5.3 (calc) — ABNORMAL HIGH (ref <5.0)
Triglycerides: 242 mg/dL — ABNORMAL HIGH (ref <150)

## 2023-09-30 LAB — URINALYSIS, ROUTINE W REFLEX MICROSCOPIC
Bilirubin Urine: NEGATIVE
Glucose, UA: NEGATIVE
Hgb urine dipstick: NEGATIVE
Ketones, ur: NEGATIVE
Leukocytes,Ua: NEGATIVE
Nitrite: NEGATIVE
Protein, ur: NEGATIVE
Specific Gravity, Urine: 1.025 (ref 1.001–1.035)
pH: 6 (ref 5.0–8.0)

## 2023-09-30 LAB — IRON, TOTAL/TOTAL IRON BINDING CAP
%SAT: 26 % (ref 20–48)
Iron: 86 ug/dL (ref 50–180)
TIBC: 326 ug/dL (ref 250–425)

## 2023-09-30 LAB — MICROALBUMIN / CREATININE URINE RATIO
Creatinine, Urine: 121 mg/dL (ref 20–320)
Microalb Creat Ratio: 7 mg/g{creat} (ref <30)
Microalb, Ur: 0.8 mg/dL

## 2023-09-30 LAB — HEMOGLOBIN A1C
Hgb A1c MFr Bld: 5.2 %{Hb} (ref <5.7)
Mean Plasma Glucose: 103 mg/dL
eAG (mmol/L): 5.7 mmol/L

## 2023-09-30 LAB — TSH: TSH: 1.13 m[IU]/L (ref 0.40–4.50)

## 2023-09-30 LAB — TESTOSTERONE: Testosterone: 241 ng/dL — ABNORMAL LOW (ref 250–827)

## 2023-09-30 LAB — VITAMIN B12: Vitamin B-12: 1598 pg/mL — ABNORMAL HIGH (ref 200–1100)

## 2023-09-30 LAB — VITAMIN D 25 HYDROXY (VIT D DEFICIENCY, FRACTURES): Vit D, 25-Hydroxy: 36 ng/mL (ref 30–100)

## 2023-09-30 LAB — INSULIN, RANDOM: Insulin: 228.7 u[IU]/mL — ABNORMAL HIGH

## 2023-09-30 LAB — MAGNESIUM: Magnesium: 2.2 mg/dL (ref 1.5–2.5)

## 2023-09-30 NOTE — Progress Notes (Signed)
<>*<>*<>*<>*<>*<>*<>*<>*<>*<>*<>*<>*<>*<>*<>*<>*<>*<>*<>*<>*<>*<>*<>*<>*<> <>*<>*<>*<>*<>*<>*<>*<>*<>*<>*<>*<>*<>*<>*<>*<>*<>*<>*<>*<>*<>*<>*<>*<>*<>  -   Test results slightly outside the reference range are not unusual. If there is anything important, I will review this with you,  otherwise it is considered normal test values.  If you have further questions,  please do not hesitate to contact me at the office or via My Chart.   <>*<>*<>*<>*<>*<>*<>*<>*<>*<>*<>*<>*<>*<>*<>*<>*<>*<>*<>*<>*<>*<>*<>*<>*<> <>*<>*<>*<>*<>*<>*<>*<>*<>*<>*<>*<>*<>*<>*<>*<>*<>*<>*<>*<>*<>*<>*<>*<>*<>  -   Vitamin B12 level is very high - So please cut back dose to 1 x / week   <>*<>*<>*<>*<>*<>*<>*<>*<>*<>*<>*<>*<>*<>*<>*<>*<>*<>*<>*<>*<>*<>*<>*<>*<> <>*<>*<>*<>*<>*<>*<>*<>*<>*<>*<>*<>*<>*<>*<>*<>*<>*<>*<>*<>*<>*<>*<>*<>*<>  -  Testosterone level is low    9 Ways to Naturally Increase Testosterone Levels  1.   Lose Weight  If you're overweight, shedding the excess pounds may increase your testosterone levels, according to research presented at the Endocrine Society's 2012 meeting. Overweight men are more likely to have low testosterone levels to begin with, so this is an important trick to increase your body's testosterone production when you need it most.  2.   High-Intensity Exercise like Peak Fitness   Short intense exercise has a proven positive effect on increasing testosterone levels and preventing its decline. That's unlike aerobics or prolonged moderate exercise, which have shown to have negative or no effect on testosterone levels. Having a whey protein meal after exercise can further enhance the satiety/testosterone-boosting impact (hunger hormones cause the opposite effect on your testosterone and libido). Here's a summary of what a typical high-intensity Peak Fitness routine might look like: " Warm up for three minutes  " Exercise as hard and fast as you can for 30 seconds. You should feel like you  couldn't possibly go on another few seconds  " Recover at a slow to moderate pace for 90 seconds  " Repeat the high intensity exercise and recovery 7 more times .  3.   Consume Plenty of Zinc  The mineral zinc is important for testosterone production, and supplementing your diet for as little as six weeks has been shown to cause a marked improvement in testosterone among men with low levels.1 Likewise, research has shown that restricting dietary sources of zinc leads to a significant decrease in testosterone, while zinc supplementation increases it - and even protects men from exercised-induced reductions in testosterone levels.  It's estimated that up to 45 percent of adults over the age of 60 may have lower than recommended zinc intakes; even when dietary supplements were added in, an estimated 20-25 percent of older adults still had inadequate zinc intakes, according to a [employer] and Nutrition Examination Survey.4 Your diet is the best source of zinc; along with protein-rich foods like  fish, other good dietary sources of zinc include raw milk, raw cheese, beans, and yogurt or kefir made from raw milk. It can be difficult to obtain enough dietary zinc if you're a vegetarian, and also for meat-eaters as well, largely because of conventional farming methods that rely heavily on chemical fertilizers and pesticides. These chemicals deplete the soil of nutrients ... nutrients like zinc that must be absorbed by plants in order to be passed on to you. In many cases, you may further deplete the nutrients in your food by the way you prepare it. For most food, cooking it will drastically reduce its levels of nutrients like zinc ... particularly over-cooking, which many people do. If you decide to use a zinc supplement, stick to a dosage of 50 mg a day, as this is the recommended adult dose. Taking too much zinc can interfere with your body's ability to absorb other minerals, especially copper, and  may  cause nausea as a side effect.  4.   Strength Training  In addition to Peak Fitness, strength training is also known to boost testosterone levels, provided you are doing so intensely enough. When strength training to boost testosterone, you'll want to increase the weight and lower your number of reps, and then focus on exercises that work a large number of muscles, such as dead lifts or squats.  You can "turbo-charge" your weight training by going slower. By slowing down your movement, you're actually turning it into a high-intensity exercise. Super Slow movement allows your muscle, at the microscopic level, to access the maximum number of cross-bridges between the protein filaments that produce movement in the muscle.   5.   Optimize Your Vitamin D Levels  Vitamin D, a steroid hormone, is essential for the healthy development of the nucleus of the sperm cell, and helps maintain semen quality and sperm count. Vitamin D also increases levels of testosterone, which may boost libido. In one study, overweight men who were given vitamin D supplements had a significant increase in testosterone levels after one year.5   6.   Reduce Stress  When you're under a lot of stress, your body releases high levels of the stress hormone cortisol. This hormone actually blocks the effects of testosterone,6 presumably because, from a biological standpoint, testosterone-associated behaviors (mating, competing, aggression) may have lowered your chances of survival in an emergency (hence, the "fight or flight" response is dominant, courtesy of cortisol).  7.   Limit or Eliminate Sugar from Your Diet  Testosterone levels decrease after you eat sugar, which is likely because the sugar leads to a high insulin level, another factor leading to low testosterone.7 Based on USDA estimates, the average American consumes 12 teaspoons of sugar a day, which equates to about TWO TONS of sugar during a lifetime.  8.   Eat Healthy  Fats  By healthy, this means not only mono- and polyunsaturated fats, like that found in avocadoes and nuts, but also saturated, as these are essential for building testosterone. Research shows that a diet with less than 40 percent of energy as fat (and that mainly from animal sources, i.e. saturated) lead to a decrease in testosterone levels. ie eat less animal products - as Meat , poultry and dairy. Experts agree that the ideal diet includes somewhere between 50-70 percent fat.  It's important to understand that your body requires saturated fats from animal and vegetable sources (such as meat, dairy, certain oils, and tropical plants like coconut) for optimal functioning, and if you neglect this important food group in favor of sugar, grains and other starchy carbs, your health and weight are almost guaranteed to suffer. Examples of healthy fats you can eat more of to give your testosterone levels a boost include: Olives and Olive oil  Coconuts and coconut oil Butter made from raw grass-fed organic milk Raw nuts, such as, almonds or pecans Organic pastured egg yolks Avocados Grass-fed meats Palm oil Unheated organic nut oils  9.   Boost Your Intake of Branch Chain Amino Acids (BCAA) from Foods Like Whey Protein Research suggests that BCAAs result in higher testosterone levels, particularly when taken along with resistance training. While BCAAs are available in supplement form, you'll find the highest concentrations of BCAAs like leucine in whey protein. Even when getting leucine from your natural food supply, it's often wasted or used as a building block instead of an anabolic agent. So to create the correct anabolic environment, you  need to boost leucine consumption way beyond mere maintenance levels. That said, keep in mind that using leucine as a free form amino acid can be highly counterproductive as when free form amino acids are artificially administrated, they rapidly enter your circulation while  disrupting insulin function, and impairing your body's glycemic control. Food-based leucine is really the ideal form that can benefit your muscles without side effects.   <>*<>*<>*<>*<>*<>*<>*<>*<>*<>*<>*<>*<>*<>*<>*<>*<>*<>*<>*<>*<>*<>*<>*<>*<> <>*<>*<>*<>*<>*<>*<>*<>*<>*<>*<>*<>*<>*<>*<>*<>*<>*<>*<>*<>*<>*<>*<>*<>*<>  -  Insulin level = 228.7 is very high ( Normal is less than 20) and shows insulin resistance - a sign of early diabetes and   associated with a 300 % greater risk for   heart attacks, strokes, cancer & Alzheimer type vascular dementia   - All this can be cured  and prevented with losing weight   - get Dr Francis Dowse Fuhrman's book 'the End of Diabetes" and   "the End of Dieting" as discussed   - and add many years of good health to your life.  <>*<>*<>*<>*<>*<>*<>*<>*<>*<>*<>*<>*<>*<>*<>*<>*<>*<>*<>*<>*<>*<>*<>*<>*<> <>*<>*<>*<>*<>*<>*<>*<>*<>*<>*<>*<>*<>*<>*<>*<>*<>*<>*<>*<>*<>*<>*<>*<>*<>  -  Chol = 189   Excellent   - Very low risk for Heart Attack  / Stroke  <>*<>*<>*<>*<>*<>*<>*<>*<>*<>*<>*<>*<>*<>*<>*<>*<>*<>*<>*<>*<>*<>*<>*<>*<> <>*<>*<>*<>*<>*<>*<>*<>*<>*<>*<>*<>*<>*<>*<>*<>*<>*<>*<>*<>*<>*<>*<>*<>*<>  -   But Triglycerides (  = 242 ) or fats in blood are too high                 (   Ideal or  Goal is less than 150  !  )    - Recommend avoid fried & greasy foods,  sweets / candy,   - Avoid white rice  (brown or wild rice or Quinoa is OK),   - Avoid white potatoes  (sweet potatoes are OK)   - Avoid anything made from white flour  - bagels, doughnuts, rolls, buns, biscuits, white and   wheat breads, pizza crust and traditional  pasta made of white flour & egg white  - (vegetarian pasta or spinach or wheat pasta is OK).    - Multi-grain bread is OK - like multi-grain flat bread or  sandwich thins.   - Avoid alcohol in excess.   - Exercise is also  important.  <>*<>*<>*<>*<>*<>*<>*<>*<>*<>*<>*<>*<>*<>*<>*<>*<>*<>*<>*<>*<>*<>*<>*<>*<> <>*<>*<>*<>*<>*<>*<>*<>*<>*<>*<>*<>*<>*<>*<>*<>*<>*<>*<>*<>*<>*<>*<>*<>*<>  -   A1c - Normal - No Diabetes  - Great !   <>*<>*<>*<>*<>*<>*<>*<>*<>*<>*<>*<>*<>*<>*<>*<>*<>*<>*<>*<>*<>*<>*<>*<>*<> <>*<>*<>*<>*<>*<>*<>*<>*<>*<>*<>*<>*<>*<>*<>*<>*<>*<>*<>*<>*<>*<>*<>*<>*<>  -   Vitamin D = 36  is EXTREMELY LOW ! ! !   - Vitamin D goal is between 70-100.   - Please make sure that you are taking your Vitamin D 1-0,000 units /day as directed.   - It is very important as a natural anti-inflammatory and helping the                           immune system protect against viral infections, like the Covid-19    helping hair, skin, and nails, as well as reducing stroke and heart attack risk.   - It helps your bones and helps with mood.  - It also decreases numerous cancer risks so please  take it as directed.   - Low Vit D is associated with a 200-300% higher risk for CANCER   and 200-300% higher risk for HEART   ATTACK  &  STROKE.    - It is also associated with higher death rate at younger ages,   autoimmune diseases like Rheumatoid arthritis, Lupus, Multiple Sclerosis.     - Also many other serious conditions, like depression, Alzheimer's  Dementia,  muscle aches, fatigue, fibromyalgia   <>*<>*<>*<>*<>*<>*<>*<>*<>*<>*<>*<>*<>*<>*<>*<>*<>*<>*<>*<>*<>*<>*<>*<>*<> <>*<>*<>*<>*<>*<>*<>*<>*<>*<>*<>*<>*<>*<>*<>*<>*<>*<>*<>*<>*<>*<>*<>*<>*<>  -   All Else - CBC - Kidneys - Electrolytes - Liver - Magnesium & Thyroid    - all  Normal / OK  <>*<>*<>*<>*<>*<>*<>*<>*<>*<>*<>*<>*<>*<>*<>*<>*<>*<>*<>*<>*<>*<>*<>*<>*<> <>*<>*<>*<>*<>*<>*<>*<>*<>*<>*<>*<>*<>*<>*<>*<>*<>*<>*<>*<>*<>*<>*<>*<>*<>

## 2023-10-16 ENCOUNTER — Other Ambulatory Visit: Payer: Self-pay

## 2023-10-16 ENCOUNTER — Encounter: Payer: Self-pay | Admitting: Nurse Practitioner

## 2023-10-16 ENCOUNTER — Ambulatory Visit (INDEPENDENT_AMBULATORY_CARE_PROVIDER_SITE_OTHER): Payer: 59 | Admitting: Nurse Practitioner

## 2023-10-16 VITALS — BP 120/60 | HR 87 | Temp 97.7°F | Ht 75.5 in | Wt 298.8 lb

## 2023-10-16 DIAGNOSIS — Z1152 Encounter for screening for COVID-19: Secondary | ICD-10-CM | POA: Diagnosis not present

## 2023-10-16 DIAGNOSIS — J069 Acute upper respiratory infection, unspecified: Secondary | ICD-10-CM

## 2023-10-16 DIAGNOSIS — R051 Acute cough: Secondary | ICD-10-CM

## 2023-10-16 LAB — POC COVID19 BINAXNOW: SARS Coronavirus 2 Ag: NEGATIVE

## 2023-10-16 MED ORDER — PROMETHAZINE-DM 6.25-15 MG/5ML PO SYRP
5.0000 mL | ORAL_SOLUTION | Freq: Four times a day (QID) | ORAL | 0 refills | Status: AC | PRN
Start: 2023-10-16 — End: ?

## 2023-10-16 MED ORDER — AZITHROMYCIN 250 MG PO TABS
ORAL_TABLET | ORAL | 1 refills | Status: AC
Start: 2023-10-16 — End: ?

## 2023-10-16 NOTE — Progress Notes (Signed)
Assessment and Plan:  Spencer Love was seen today for an episodic visit.  Diagnoses and all order for this visit:  Encounter for screening for COVID-19 Negative  - POC COVID-19  Upper respiratory tract infection, unspecified type/acute cough Recent tmt with steroids for low back pain - defer today.. Start Z-Pak as directed Say well hydrated to keep mucus thin and productive. Continue OTC Mucinex Continue Ventolin and Duoneb as needed Report to ER for any increase in difficulty breathing .  - azithromycin (ZITHROMAX) 250 MG tablet; Take 2 tablets on  Day 1,  followed by 1 tablet  daily for 4 more days    for Sinusitis  /Bronchitis  Dispense: 6 each; Refill: 1 - promethazine-dextromethorphan (PROMETHAZINE-DM) 6.25-15 MG/5ML syrup; Take 5 mLs by mouth 4 (four) times daily as needed for cough.  Dispense: 240 mL; Refill: 0   Notify office for further evaluation and treatment, questions or concerns if s/s fail to improve. The risks and benefits of my recommendations, as well as other treatment options were discussed with the patient today. Questions were answered.  Further disposition pending results of labs. Discussed med's effects and SE's.    Over 15 minutes of exam, counseling, chart review, and critical decision making was performed.   Future Appointments  Date Time Provider Department Center  01/18/2024  3:30 PM Adela Glimpse, NP [facility] None  04/19/2024  3:30 PM Lucky Cowboy, MD [facility] None  07/21/2024  3:30 PM Adela Glimpse, NP [facility] None  10/24/2024  3:00 PM Lucky Cowboy, MD [facility] None    ------------------------------------------------------------------------------------------------------------------   HPI BP 120/60   Pulse 87   Temp 97.7 F (36.5 C)   Ht 6' 3.5" (1.918 m)   Wt 298 lb 12.8 oz (135.5 kg)   SpO2 98%   BMI 36.85 kg/m   Patient complains of symptoms of a URI, possible sinusitis. Symptoms include bilateral ear  pressure/pain, congestion, cough described as productive, nasal congestion, shortness of breath, sinus pressure, sneezing, sore throat, and wheezing. Onset of symptoms was 5 days ago, and has been unchanged since that time. Treatment to date: antihistamines, cough suppressants, decongestants, nasal steroids, and inhaler Ventolin. .Denies fever, chills, N/V.    Past Medical History:  Diagnosis Date   Anxiety    Depression    GERD (gastroesophageal reflux disease)    Hidradenitis suppurativa    Hyperlipidemia    Hypertension    Prediabetes      Allergies  Allergen Reactions   Peanut-Containing Drug Products Anaphylaxis   Ceclor [Cefaclor] Hives and Swelling   Sulfa Antibiotics Hives    Current Outpatient Medications on File Prior to Visit  Medication Sig   albuterol (VENTOLIN HFA) 108 (90 Base) MCG/ACT inhaler Inhale 1 puff into the lungs 4 (four) times daily.   amLODipine (NORVASC) 10 MG tablet Take  1/2 to 1 tablet  Daily  for BP                                              /                                          TAKE  BY                                  MOUTH   Ascorbic Acid (VITAMIN C) 1000 MG tablet Take 1,000 mg by mouth daily.   aspirin EC 81 MG tablet Take 81 mg by mouth 2 (two) times daily.   busPIRone (BUSPAR) 10 MG tablet Take  1/2 to 1 tablet   3 x /day  as needed  for Chronic Anxiety                                                                     /  TAKE                                                            BY                                               MOUTH   cetirizine (KLS ALLER-TEC) 10 MG tablet Take 1 tablet (10 mg total) by mouth daily.   cloNIDine (CATAPRES) 0.2 MG tablet Take  1 tablet  3 x/day for BP                                                                               /                        TAKE                                   BY                                 MOUTH   dexamethasone (DECADRON) 4 MG tablet Take 1 tab 3 x day - 3 days, then 2 x day - 3 days, then 1 tab daily   fish oil-omega-3 fatty acids 1000 MG capsule Take 1 g by mouth 2 (two) times daily.   Flaxseed, Linseed, (FLAXSEED OIL PO) as needed.   gabapentin (NEURONTIN) 600 MG tablet Take 1/2 to 1 tablet 2 to 3 x / Daily as needed for Pain   hydrochlorothiazide (HYDRODIURIL) 25 MG tablet Take  1 tablet  Daily for BP & Fluid Retention / Ankle Swelling                                          /                                           take                                                            by                                  mouth   ipratropium-albuterol (DUONEB) 0.5-2.5 (3) MG/3ML SOLN Take by nebulization.  lisinopril (ZESTRIL) 20 MG tablet Take  1 tablet   2 x /day  for BP                                                                         /                                         take                                   by                             mouth   Magnesium 400 MG TABS Take 400 mg by mouth daily.   meloxicam (MOBIC) 7.5 MG tablet Take 1 tablet (7.5 mg total) by mouth 2 (two) times daily.   metFORMIN (GLUCOPHAGE-XR) 500 MG 24 hr tablet Take   2 tablets   2 x /day  with Meals  for Diabetes   methocarbamol (ROBAXIN-750) 750 MG tablet Take 1 tablet (750 mg total) by mouth every 8 (eight) hours as needed for muscle spasms.   pantoprazole (PROTONIX) 40 MG tablet Take 40  mg by mouth 2 (two) times daily before a meal.   propranolol (INDERAL) 80 MG tablet TAKE ONE TABLET BY MOUTH THREE TIMES DAILY FOR BLOOD PRESSURE Strength: 80 mg   rosuvastatin (CRESTOR) 20 MG tablet take 1 tablet by mouth daily for cholesterol   VITAMIN D PO Take 10,000 Units by mouth daily.   zinc gluconate 50 MG tablet Take 50 mg by mouth daily.   No current facility-administered medications on file prior to visit.    ROS: all negative except what is noted in the HPI.   Physical Exam:  BP 120/60   Pulse 87   Temp 97.7 F (36.5 C)   Ht 6' 3.5" (1.918 m)   Wt 298 lb 12.8 oz (135.5 kg)   SpO2 98%   BMI 36.85 kg/m   General Appearance: NAD.  Awake, conversant and cooperative. Eyes: PERRLA, EOMs intact.  Sclera white.  Conjunctiva without erythema. Sinuses: No frontal/maxillary tenderness.  No nasal discharge. Nares patent.  ENT/Mouth: Ext aud canals clear.  Bilateral TMs w/DOL and without erythema or bulging. Hearing intact.  Posterior pharynx without swelling or exudate.  Tonsils without swelling or erythema.  Neck: Supple.  No masses, nodules or thyromegaly. Respiratory: Effort is regular with non-labored breathing. Breath sounds are equal bilaterally without rales, rhonchi, wheezing or stridor.  Cardio: RRR with no MRGs. Brisk peripheral pulses without edema.  Abdomen: Active BS in all four quadrants.  Soft and non-tender without guarding, rebound tenderness, hernias or masses. Lymphatics: Non tender without lymphadenopathy.  Musculoskeletal: Full ROM, 5/5 strength, normal ambulation.  No clubbing or cyanosis. Skin: Appropriate color for ethnicity. Warm without rashes, lesions, ecchymosis, ulcers.  Neuro: CN II-XII grossly normal. Normal muscle tone without cerebellar symptoms and intact sensation.   Psych: AO X 3,  appropriate mood and affect, insight and judgment.  Adela Glimpse, NP 12:06 PM [facility]

## 2023-10-16 NOTE — Patient Instructions (Signed)

## 2023-10-24 ENCOUNTER — Other Ambulatory Visit: Payer: Self-pay | Admitting: Gastroenterology

## 2023-11-21 ENCOUNTER — Other Ambulatory Visit: Payer: Self-pay | Admitting: Nurse Practitioner

## 2023-11-21 DIAGNOSIS — E782 Mixed hyperlipidemia: Secondary | ICD-10-CM

## 2023-12-21 ENCOUNTER — Other Ambulatory Visit: Payer: Self-pay | Admitting: Nurse Practitioner

## 2024-01-09 ENCOUNTER — Other Ambulatory Visit: Payer: Self-pay | Admitting: Internal Medicine

## 2024-01-09 DIAGNOSIS — I10 Essential (primary) hypertension: Secondary | ICD-10-CM

## 2024-01-18 ENCOUNTER — Ambulatory Visit: Payer: 59 | Admitting: Nurse Practitioner

## 2024-01-18 ENCOUNTER — Other Ambulatory Visit: Payer: Self-pay | Admitting: Internal Medicine

## 2024-01-18 DIAGNOSIS — I10 Essential (primary) hypertension: Secondary | ICD-10-CM

## 2024-01-28 ENCOUNTER — Ambulatory Visit (INDEPENDENT_AMBULATORY_CARE_PROVIDER_SITE_OTHER): Payer: 59 | Admitting: Nurse Practitioner

## 2024-01-28 ENCOUNTER — Other Ambulatory Visit: Payer: Self-pay | Admitting: Internal Medicine

## 2024-01-28 VITALS — BP 130/80 | HR 91 | Temp 98.7°F | Resp 16 | Ht 75.5 in | Wt 311.6 lb

## 2024-01-28 DIAGNOSIS — E782 Mixed hyperlipidemia: Secondary | ICD-10-CM

## 2024-01-28 DIAGNOSIS — Z8249 Family history of ischemic heart disease and other diseases of the circulatory system: Secondary | ICD-10-CM

## 2024-01-28 DIAGNOSIS — E66811 Obesity, class 1: Secondary | ICD-10-CM

## 2024-01-28 DIAGNOSIS — I10 Essential (primary) hypertension: Secondary | ICD-10-CM | POA: Diagnosis not present

## 2024-01-28 DIAGNOSIS — E88819 Insulin resistance, unspecified: Secondary | ICD-10-CM

## 2024-01-28 DIAGNOSIS — E559 Vitamin D deficiency, unspecified: Secondary | ICD-10-CM

## 2024-01-28 DIAGNOSIS — R7309 Other abnormal glucose: Secondary | ICD-10-CM

## 2024-01-28 DIAGNOSIS — K219 Gastro-esophageal reflux disease without esophagitis: Secondary | ICD-10-CM

## 2024-01-28 DIAGNOSIS — Z79899 Other long term (current) drug therapy: Secondary | ICD-10-CM

## 2024-01-28 NOTE — Progress Notes (Signed)
Assessment and Plan:  Spencer Love was seen today for a follow up.  Diagnoses and all order for this visit:  1. Essential hypertension (Primary)/Family hx of heart disease Continue amlodipine, ASA, hydrochlorothiazide, Lisinopril, propranolol Refer back to Cardiology - established with Dr. Antoine Poche last seen 02/2017 for updated Echocardiogram and establishment of care per patient request with family hx of heart disease. Discussed DASH (Dietary Approaches to Stop Hypertension) DASH diet is lower in sodium than a typical American diet. Cut back on foods that are high in saturated fat, cholesterol, and trans fats. Eat more whole-grain foods, fish, poultry, and nuts Remain active and exercise as tolerated daily.  Monitor BP at home-Call if greater than 130/80.  Check CMP/CBC  - CBC with Differential/Platelet - COMPLETE METABOLIC PANEL WITH GFR - Ambulatory referral to Cardiology   2. Hyperlipidemia, mixed Continue Rosuvastatin  Discussed lifestyle modifications. Recommended diet heavy in fruits and veggies, omega 3's. Decrease consumption of animal meats, cheeses, and dairy products. Remain active and exercise as tolerated. Continue to monitor. Check lipids/TSH  - Lipid panel  3. Abnormal glucose/Insulin Resistance Continue Metformin Education: Reviewed 'ABCs' of diabetes management  Discussed goals to be met and/or maintained include A1C (<7) Blood pressure (<130/80) Cholesterol (LDL <70) Continue Eye Exam yearly  Continue Dental Exam Q6 mo Discussed dietary recommendations Discussed Physical Activity recommendations Check A1C  - Hemoglobin A1c  4.  Vitamin D deficiency Continue supplement for goal of 60-100 Monitor Vitamin D levels  5. Gastroesophageal reflux disease, unspecified whether esophagitis present Continue Pantoprazole No suspected reflux complications (Barret/stricture). Lifestyle modification:  wt loss, avoid meals 2-3h before bedtime. Consider  eliminating food triggers:  chocolate, caffeine, EtOH, acid/spicy food.  6. Medication management All medications discussed and reviewed in full. All questions and concerns regarding medications addressed.    - CBC with Differential/Platelet - COMPLETE METABOLIC PANEL WITH GFR - Lipid panel - Hemoglobin A1c  7. Obesity (BMI 30.0-34.9) Discussed appropriate BMI Diet modification. Physical activity. Encouraged/praised to build confidence.   Notify office for further evaluation and treatment, questions or concerns if s/s fail to improve. The risks and benefits of my recommendations, as well as other treatment options were discussed with the patient today. Questions were answered.  Further disposition pending results of labs. Discussed med's effects and SE's.    Over 30 minutes of exam, counseling, chart review, and critical decision making was performed.   Future Appointments  Date Time Provider Department Center  04/19/2024  3:30 PM Lucky Cowboy, MD [facility] None  07/21/2024  3:30 PM Adela Glimpse, NP [facility] None  10/24/2024  3:00 PM Lucky Cowboy, MD [facility] None    ------------------------------------------------------------------------------------------------------------------   HPI BP 130/80   Pulse 91   Temp 98.7 F (37.1 C)   Resp 16   Ht 6' 3.5" (1.918 m)   Wt (!) 311 lb 9.6 oz (141.3 kg)   SpO2 99%   BMI 38.43 kg/m   37 y.o.male presents for a general 3 month follow up.   Overall he reports feeling well.  He does note a hx of family heart disease and following with Dr. Antoine Poche in the past.  Last seen 02/2017.  Was noted to have dyspnea, mild AI with mild LVH.  Evaluation of POET demonstrated no ischemia.  He has not noticed any increase in chest discomfort, neck or arm pain, or new SOB.  He feels as though he would benefit from an overall work up considering he has not been seen in the last several years.  He is on cholesterol medication  Rosuvastatin and denies myalgias. His cholesterol is not at goal.   He has a hx of prediabetes and insulin resistance.  Currently taking Metformin without SE.    BMI is Body mass index is 38.43 kg/m., he has not been working on diet and exercise. Wt Readings from Last 3 Encounters:  01/28/24 (!) 311 lb 9.6 oz (141.3 kg)  10/16/23 298 lb 12.8 oz (135.5 kg)  09/29/23 290 lb 3.2 oz (131.6 kg)   He takes daily Vitamin D supplement for hx of vitamin D deficiency.    Past Medical History:  Diagnosis Date   Anxiety    Depression    GERD (gastroesophageal reflux disease)    Hidradenitis suppurativa    Hyperlipidemia    Hypertension    Prediabetes      Allergies  Allergen Reactions   Peanut-Containing Drug Products Anaphylaxis   Ceclor [Cefaclor] Hives and Swelling   Sulfa Antibiotics Hives    Current Outpatient Medications on File Prior to Visit  Medication Sig   albuterol (VENTOLIN HFA) 108 (90 Base) MCG/ACT inhaler Inhale 1 puff into the lungs 4 (four) times daily.   Ascorbic Acid (VITAMIN C) 1000 MG tablet Take 1,000 mg by mouth daily.   aspirin EC 81 MG tablet Take 81 mg by mouth 2 (two) times daily.   busPIRone (BUSPAR) 10 MG tablet Take  1/2 to 1 tablet   3 x /day  as needed  for Chronic Anxiety                                                                     /  TAKE                                                             BY                                               MOUTH   cetirizine (KLS ALLER-TEC) 10 MG tablet Take 1 tablet (10 mg total) by mouth daily.   cloNIDine (CATAPRES) 0.2 MG tablet Take  1 tablet  3 x/day for BP                                                                               /                        TAKE                                   BY                                 MOUTH   fish oil-omega-3 fatty acids 1000 MG capsule Take 1 g by mouth 2 (two) times daily.   Flaxseed, Linseed, (FLAXSEED OIL PO) as needed.   gabapentin (NEURONTIN) 600 MG tablet Take 1/2 to 1 tablet 2 to 3 x / Daily as needed for Pain   hydrochlorothiazide (HYDRODIURIL) 25 MG tablet take 1 tablet Daily for BP, Fluid Retention and Ankle Swelling            /       TAKE      BY      MOUTH   ipratropium-albuterol (DUONEB) 0.5-2.5 (3) MG/3ML SOLN Take by nebulization.   lisinopril (ZESTRIL) 20 MG tablet take 1 tablet by mouth twice a day for bood pressure   Magnesium 400 MG TABS Take 400 mg by mouth daily.   meloxicam (MOBIC) 7.5 MG tablet TAKE ONE TABLET BY MOUTH TWICE DAILY   metFORMIN (GLUCOPHAGE-XR) 500 MG 24 hr tablet Take   2 tablets   2 x /day  with Meals  for Diabetes   methocarbamol (ROBAXIN-750) 750 MG tablet Take 1 tablet (750 mg total) by mouth every 8 (eight) hours as needed for muscle spasms.   pantoprazole (PROTONIX) 40 MG tablet Take 1 tablet (40 mg total) by mouth 2 (two) times daily before a meal.   propranolol (INDERAL) 80 MG tablet TAKE ONE TABLET BY MOUTH THREE TIMES DAILY FOR BLOOD PRESSURE Strength: 80 mg   rosuvastatin (CRESTOR) 20 MG tablet take 1 tablet by mouth daily for cholesterol   VITAMIN D PO Take 10,000 Units by mouth daily.   zinc gluconate 50 MG tablet Take 50 mg by mouth daily.   azithromycin (  ZITHROMAX) 250 MG tablet Take 2 tablets on  Day 1,  followed by 1 tablet  daily for 4 more days    for Sinusitis  /Bronchitis (Patient not taking: Reported on 01/28/2024)    promethazine-dextromethorphan (PROMETHAZINE-DM) 6.25-15 MG/5ML syrup Take 5 mLs by mouth 4 (four) times daily as needed for cough. (Patient not taking: Reported on 01/28/2024)   No current facility-administered medications on file prior to visit.    ROS: all negative except what is noted in the HPI.   Physical Exam:  BP 130/80   Pulse 91   Temp 98.7 F (37.1 C)   Resp 16   Ht 6' 3.5" (1.918 m)   Wt (!) 311 lb 9.6 oz (141.3 kg)   SpO2 99%   BMI 38.43 kg/m   General Appearance: NAD.  Awake, conversant and cooperative. Eyes: PERRLA, EOMs intact.  Sclera white.  Conjunctiva without erythema. Sinuses: No frontal/maxillary tenderness.  No nasal discharge. Nares patent.  ENT/Mouth: Ext aud canals clear.  Bilateral TMs w/DOL and without erythema or bulging. Hearing intact.  Posterior pharynx without swelling or exudate.  Tonsils without swelling or erythema.  Neck: Supple.  No masses, nodules or thyromegaly. Respiratory: Effort is regular with non-labored breathing. Breath sounds are equal bilaterally without rales, rhonchi, wheezing or stridor.  Cardio: RRR with no MRGs. Brisk peripheral pulses without edema.  Abdomen: Active BS in all four quadrants.  Soft and non-tender without guarding, rebound tenderness, hernias or masses. Lymphatics: Non tender without lymphadenopathy.  Musculoskeletal: Full ROM, 5/5 strength, normal ambulation.  No clubbing or cyanosis. Skin: Appropriate color for ethnicity. Warm without rashes, lesions, ecchymosis, ulcers.  Neuro: CN II-XII grossly normal. Normal muscle tone without cerebellar symptoms and intact sensation.   Psych: AO X 3,  appropriate mood and affect, insight and judgment.     Adela Glimpse, NP 9:45 PM [facility]

## 2024-01-29 LAB — LIPID PANEL
Cholesterol: 135 mg/dL (ref <200)
HDL: 33 mg/dL — ABNORMAL LOW (ref >=40)
LDL Cholesterol (Calc): 64 mg/dL
Non-HDL Cholesterol (Calc): 102 mg/dL (ref <130)
Total CHOL/HDL Ratio: 4.1 (calc) (ref <5.0)
Triglycerides: 313 mg/dL — ABNORMAL HIGH (ref <150)

## 2024-01-29 LAB — CBC WITH DIFFERENTIAL/PLATELET
Absolute Lymphocytes: 2345 {cells}/uL (ref 850–3900)
Absolute Monocytes: 455 {cells}/uL (ref 200–950)
Basophils Absolute: 63 {cells}/uL (ref 0–200)
Basophils Relative: 0.9 %
Eosinophils Absolute: 189 {cells}/uL (ref 15–500)
Eosinophils Relative: 2.7 %
HCT: 41.5 % (ref 38.5–50.0)
Hemoglobin: 14.3 g/dL (ref 13.2–17.1)
MCH: 30.6 pg (ref 27.0–33.0)
MCHC: 34.5 g/dL (ref 32.0–36.0)
MCV: 88.9 fL (ref 80.0–100.0)
MPV: 11.3 fL (ref 7.5–12.5)
Monocytes Relative: 6.5 %
Neutro Abs: 3948 {cells}/uL (ref 1500–7800)
Neutrophils Relative %: 56.4 %
Platelets: 200 10*3/uL (ref 140–400)
RBC: 4.67 10*6/uL (ref 4.20–5.80)
RDW: 13 % (ref 11.0–15.0)
Total Lymphocyte: 33.5 %
WBC: 7 10*3/uL (ref 3.8–10.8)

## 2024-01-29 LAB — COMPLETE METABOLIC PANEL WITH GFR
AG Ratio: 3.3 (calc) — ABNORMAL HIGH (ref 1.0–2.5)
ALT: 38 U/L (ref 9–46)
AST: 21 U/L (ref 10–40)
Albumin: 5.2 g/dL — ABNORMAL HIGH (ref 3.6–5.1)
Alkaline phosphatase (APISO): 46 U/L (ref 36–130)
BUN: 16 mg/dL (ref 7–25)
CO2: 28 mmol/L (ref 20–32)
Calcium: 9.6 mg/dL (ref 8.6–10.3)
Chloride: 101 mmol/L (ref 98–110)
Creat: 0.82 mg/dL (ref 0.60–1.26)
Globulin: 1.6 g/dL — ABNORMAL LOW (ref 1.9–3.7)
Glucose, Bld: 98 mg/dL (ref 65–99)
Potassium: 3.4 mmol/L — ABNORMAL LOW (ref 3.5–5.3)
Sodium: 139 mmol/L (ref 135–146)
Total Bilirubin: 0.4 mg/dL (ref 0.2–1.2)
Total Protein: 6.8 g/dL (ref 6.1–8.1)
eGFR: 117 mL/min/{1.73_m2} (ref >=60)

## 2024-01-29 LAB — HEMOGLOBIN A1C
Hgb A1c MFr Bld: 4.8 %{Hb} (ref <5.7)
Mean Plasma Glucose: 91 mg/dL
eAG (mmol/L): 5 mmol/L

## 2024-01-31 ENCOUNTER — Encounter: Payer: Self-pay | Admitting: Nurse Practitioner

## 2024-01-31 NOTE — Patient Instructions (Signed)

## 2024-02-01 ENCOUNTER — Encounter: Payer: Self-pay | Admitting: Nurse Practitioner

## 2024-02-12 ENCOUNTER — Other Ambulatory Visit: Payer: Self-pay | Admitting: Family

## 2024-02-12 MED ORDER — METHOCARBAMOL 750 MG PO TABS: 750.0000 mg | ORAL_TABLET | Freq: Three times a day (TID) | ORAL | 1 refills | Status: AC | PRN

## 2024-03-01 ENCOUNTER — Other Ambulatory Visit: Payer: Self-pay

## 2024-03-01 DIAGNOSIS — E782 Mixed hyperlipidemia: Secondary | ICD-10-CM

## 2024-03-01 MED ORDER — ROSUVASTATIN CALCIUM 20 MG PO TABS: ORAL_TABLET | ORAL | 0 refills | Status: AC

## 2024-03-17 ENCOUNTER — Other Ambulatory Visit: Payer: Self-pay

## 2024-03-17 MED ORDER — CLONIDINE HCL 0.2 MG PO TABS: ORAL_TABLET | ORAL | 0 refills | Status: AC

## 2024-04-19 ENCOUNTER — Ambulatory Visit: Payer: 59 | Admitting: Internal Medicine

## 2024-05-11 ENCOUNTER — Other Ambulatory Visit: Payer: Self-pay | Admitting: Gastroenterology

## 2024-07-21 ENCOUNTER — Ambulatory Visit: Payer: 59 | Admitting: Nurse Practitioner

## 2024-10-11 ENCOUNTER — Encounter: Payer: 59 | Admitting: Internal Medicine

## 2024-10-24 ENCOUNTER — Encounter: Payer: 59 | Admitting: Internal Medicine

## 2025-01-09 ENCOUNTER — Other Ambulatory Visit: Payer: Self-pay | Admitting: Gastroenterology
